# Patient Record
Sex: Male | Born: 1960 | Race: White | Hispanic: No | Smoking: Former smoker
Health system: Southern US, Community
[De-identification: ages and names within clinical notes are randomized; demographics above are authoritative.]

## PROBLEM LIST (undated history)

## (undated) DIAGNOSIS — F419 Anxiety disorder, unspecified: Secondary | ICD-10-CM

## (undated) DIAGNOSIS — F1020 Alcohol dependence, uncomplicated: Secondary | ICD-10-CM

## (undated) DIAGNOSIS — Z86010 Personal history of colonic polyps: Secondary | ICD-10-CM

## (undated) DIAGNOSIS — F909 Attention-deficit hyperactivity disorder, unspecified type: Secondary | ICD-10-CM

## (undated) DIAGNOSIS — I10 Essential (primary) hypertension: Secondary | ICD-10-CM

## (undated) DIAGNOSIS — E785 Hyperlipidemia, unspecified: Secondary | ICD-10-CM

## (undated) DIAGNOSIS — F32A Depression, unspecified: Secondary | ICD-10-CM

## (undated) DIAGNOSIS — K219 Gastro-esophageal reflux disease without esophagitis: Secondary | ICD-10-CM

## (undated) DIAGNOSIS — F1010 Alcohol abuse, uncomplicated: Secondary | ICD-10-CM

## (undated) DIAGNOSIS — F329 Major depressive disorder, single episode, unspecified: Secondary | ICD-10-CM

## (undated) DIAGNOSIS — E118 Type 2 diabetes mellitus with unspecified complications: Secondary | ICD-10-CM

## (undated) HISTORY — DX: Hyperlipidemia, unspecified: E78.5

## (undated) HISTORY — DX: Anxiety disorder, unspecified: F41.9

## (undated) HISTORY — DX: Personal history of colonic polyps: Z86.010

## (undated) HISTORY — PX: PILONIDAL CYST EXCISION: SHX744

## (undated) HISTORY — DX: Type 2 diabetes mellitus with unspecified complications: E11.8

## (undated) HISTORY — DX: Attention-deficit hyperactivity disorder, unspecified type: F90.9

## (undated) HISTORY — DX: Gastro-esophageal reflux disease without esophagitis: K21.9

## (undated) HISTORY — DX: Depression, unspecified: F32.A

---
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
## 2002-02-17 ENCOUNTER — Encounter: Payer: Self-pay | Admitting: Podiatry

## 2002-02-20 ENCOUNTER — Ambulatory Visit (HOSPITAL_COMMUNITY): Admission: RE | Admit: 2002-02-20 | Discharge: 2002-02-20 | Payer: Self-pay | Admitting: *Deleted

## 2005-01-23 ENCOUNTER — Ambulatory Visit: Payer: Self-pay | Admitting: Internal Medicine

## 2005-12-30 ENCOUNTER — Ambulatory Visit: Payer: Self-pay | Admitting: Internal Medicine

## 2006-02-03 ENCOUNTER — Ambulatory Visit: Payer: Self-pay | Admitting: Internal Medicine

## 2006-03-31 ENCOUNTER — Ambulatory Visit: Payer: Self-pay | Admitting: Internal Medicine

## 2006-06-15 ENCOUNTER — Ambulatory Visit: Payer: Self-pay | Admitting: Internal Medicine

## 2006-07-23 ENCOUNTER — Ambulatory Visit: Payer: Self-pay | Admitting: Internal Medicine

## 2006-08-24 ENCOUNTER — Ambulatory Visit: Payer: Self-pay | Admitting: Internal Medicine

## 2006-09-22 ENCOUNTER — Ambulatory Visit: Payer: Self-pay | Admitting: Internal Medicine

## 2006-11-08 ENCOUNTER — Emergency Department (HOSPITAL_COMMUNITY): Admission: EM | Admit: 2006-11-08 | Discharge: 2006-11-08 | Payer: Self-pay | Admitting: Emergency Medicine

## 2006-12-20 ENCOUNTER — Ambulatory Visit: Payer: Self-pay | Admitting: Endocrinology

## 2007-04-27 ENCOUNTER — Ambulatory Visit: Payer: Self-pay | Admitting: Internal Medicine

## 2007-04-27 LAB — CONVERTED CEMR LAB
ALT: 37 units/L (ref 0–40)
AST: 36 units/L (ref 0–37)
Albumin: 4.4 g/dL (ref 3.5–5.2)
Alkaline Phosphatase: 101 units/L (ref 39–117)
BUN: 12 mg/dL (ref 6–23)
Bilirubin, Direct: 0.1 mg/dL (ref 0.0–0.3)
CO2: 31 meq/L (ref 19–32)
Calcium: 9.4 mg/dL (ref 8.4–10.5)
Chloride: 102 meq/L (ref 96–112)
Cholesterol: 231 mg/dL (ref 0–200)
Creatinine, Ser: 0.9 mg/dL (ref 0.4–1.5)
Direct LDL: 137.1 mg/dL
GFR calc Af Amer: 118 mL/min
GFR calc non Af Amer: 97 mL/min
Glucose, Bld: 102 mg/dL — ABNORMAL HIGH (ref 70–99)
HDL: 60 mg/dL (ref >39.0)
Hgb A1c MFr Bld: 6.3 % — ABNORMAL HIGH (ref 4.6–6.0)
Potassium: 4.8 meq/L (ref 3.5–5.1)
Sodium: 141 meq/L (ref 135–145)
Total Bilirubin: 1 mg/dL (ref 0.3–1.2)
Total CHOL/HDL Ratio: 3.8
Total Protein: 7.7 g/dL (ref 6.0–8.3)
Triglycerides: 207 mg/dL (ref 0–149)
VLDL: 41 mg/dL — ABNORMAL HIGH (ref 0–40)

## 2007-06-09 ENCOUNTER — Ambulatory Visit: Payer: Self-pay | Admitting: Endocrinology

## 2007-06-09 LAB — CONVERTED CEMR LAB
Creatinine,U: 189.2 mg/dL
Hgb A1c MFr Bld: 6.5 % — ABNORMAL HIGH (ref 4.6–6.0)
Microalb Creat Ratio: 5.3 mg/g (ref 0.0–30.0)
Microalb, Ur: 1 mg/dL (ref 0.0–1.9)

## 2007-07-11 DIAGNOSIS — E7800 Pure hypercholesterolemia, unspecified: Secondary | ICD-10-CM

## 2007-07-11 DIAGNOSIS — K219 Gastro-esophageal reflux disease without esophagitis: Secondary | ICD-10-CM | POA: Insufficient documentation

## 2007-07-11 DIAGNOSIS — F419 Anxiety disorder, unspecified: Secondary | ICD-10-CM

## 2007-07-22 ENCOUNTER — Ambulatory Visit: Payer: Self-pay | Admitting: Internal Medicine

## 2007-07-22 DIAGNOSIS — T887XXA Unspecified adverse effect of drug or medicament, initial encounter: Secondary | ICD-10-CM | POA: Insufficient documentation

## 2007-07-22 DIAGNOSIS — I10 Essential (primary) hypertension: Secondary | ICD-10-CM

## 2007-07-22 LAB — CONVERTED CEMR LAB
Cholesterol, target level: 200 mg/dL
HDL goal, serum: 40 mg/dL
LDL Goal: 100 mg/dL

## 2007-07-27 LAB — CONVERTED CEMR LAB
ALT: 39 units/L (ref 0–53)
AST: 33 units/L (ref 0–37)
Albumin: 4.3 g/dL (ref 3.5–5.2)
Alkaline Phosphatase: 109 units/L (ref 39–117)
BUN: 16 mg/dL (ref 6–23)
Bilirubin, Direct: 0.1 mg/dL (ref 0.0–0.3)
CO2: 30 meq/L (ref 19–32)
Calcium: 10.3 mg/dL (ref 8.4–10.5)
Chloride: 102 meq/L (ref 96–112)
Cholesterol: 292 mg/dL (ref 0–200)
Creatinine, Ser: 0.9 mg/dL (ref 0.4–1.5)
Direct LDL: 166.7 mg/dL
GFR calc Af Amer: 118 mL/min
GFR calc non Af Amer: 97 mL/min
Glucose, Bld: 100 mg/dL — ABNORMAL HIGH (ref 70–99)
HDL: 51.4 mg/dL (ref >39.0)
Hgb A1c MFr Bld: 6.4 % — ABNORMAL HIGH (ref 4.6–6.0)
Potassium: 5.5 meq/L — ABNORMAL HIGH (ref 3.5–5.1)
Sodium: 140 meq/L (ref 135–145)
Total Bilirubin: 0.9 mg/dL (ref 0.3–1.2)
Total CHOL/HDL Ratio: 5.7
Total Protein: 7.2 g/dL (ref 6.0–8.3)
Triglycerides: 496 mg/dL (ref 0–149)
VLDL: 99 mg/dL — ABNORMAL HIGH (ref 0–40)

## 2007-09-09 ENCOUNTER — Ambulatory Visit: Payer: Self-pay | Admitting: Internal Medicine

## 2007-09-09 LAB — CONVERTED CEMR LAB
Cholesterol: 286 mg/dL (ref 0–200)
Direct LDL: 182.2 mg/dL
HDL: 46.1 mg/dL (ref >39.0)
Total CHOL/HDL Ratio: 6.2
Triglycerides: 339 mg/dL (ref 0–149)
VLDL: 68 mg/dL — ABNORMAL HIGH (ref 0–40)

## 2007-09-24 ENCOUNTER — Encounter: Payer: Self-pay | Admitting: *Deleted

## 2008-05-17 ENCOUNTER — Telehealth: Payer: Self-pay | Admitting: Internal Medicine

## 2008-06-25 ENCOUNTER — Encounter: Payer: Self-pay | Admitting: Endocrinology

## 2008-08-13 ENCOUNTER — Telehealth: Payer: Self-pay | Admitting: Endocrinology

## 2008-08-13 ENCOUNTER — Telehealth: Payer: Self-pay | Admitting: Internal Medicine

## 2008-09-07 ENCOUNTER — Ambulatory Visit: Payer: Self-pay | Admitting: Endocrinology

## 2008-09-14 LAB — CONVERTED CEMR LAB
ALT: 40 units/L (ref 0–53)
AST: 37 units/L (ref 0–37)
Albumin: 4.3 g/dL (ref 3.5–5.2)
Alkaline Phosphatase: 91 units/L (ref 39–117)
BUN: 12 mg/dL (ref 6–23)
Bilirubin, Direct: 0.1 mg/dL (ref 0.0–0.3)
CO2: 29 meq/L (ref 19–32)
Calcium: 9.3 mg/dL (ref 8.4–10.5)
Chloride: 104 meq/L (ref 96–112)
Cholesterol: 269 mg/dL (ref 0–200)
Creatinine, Ser: 0.9 mg/dL (ref 0.4–1.5)
Creatinine,U: 186.4 mg/dL
Direct LDL: 169.5 mg/dL
GFR calc Af Amer: 117 mL/min
GFR calc non Af Amer: 97 mL/min
Glucose, Bld: 100 mg/dL — ABNORMAL HIGH (ref 70–99)
HDL: 46.6 mg/dL (ref >39.0)
Hgb A1c MFr Bld: 6.8 % — ABNORMAL HIGH (ref 4.6–6.0)
Microalb Creat Ratio: 4.8 mg/g (ref 0.0–30.0)
Microalb, Ur: 0.9 mg/dL (ref 0.0–1.9)
Potassium: 4.1 meq/L (ref 3.5–5.1)
Sodium: 142 meq/L (ref 135–145)
TSH: 0.95 microintl units/mL (ref 0.35–5.50)
Total Bilirubin: 1 mg/dL (ref 0.3–1.2)
Total CHOL/HDL Ratio: 5.8
Total Protein: 7.4 g/dL (ref 6.0–8.3)
Triglycerides: 515 mg/dL (ref 0–149)
VLDL: 103 mg/dL — ABNORMAL HIGH (ref 0–40)

## 2008-09-20 ENCOUNTER — Ambulatory Visit: Payer: Self-pay | Admitting: Internal Medicine

## 2008-11-07 ENCOUNTER — Telehealth: Payer: Self-pay | Admitting: Internal Medicine

## 2008-11-08 ENCOUNTER — Telehealth: Payer: Self-pay | Admitting: Internal Medicine

## 2008-11-20 ENCOUNTER — Encounter: Payer: Self-pay | Admitting: Internal Medicine

## 2009-02-20 ENCOUNTER — Ambulatory Visit: Payer: Self-pay | Admitting: Internal Medicine

## 2009-02-21 LAB — CONVERTED CEMR LAB
ALT: 48 units/L (ref 0–53)
AST: 48 units/L — ABNORMAL HIGH (ref 0–37)
BUN: 19 mg/dL (ref 6–23)
CO2: 26 meq/L (ref 19–32)
Direct LDL: 120.2 mg/dL
GFR calc Af Amer: 134 mL/min
Glucose, Bld: 106 mg/dL — ABNORMAL HIGH (ref 70–99)
HDL: 60.4 mg/dL (ref >39.0)
Potassium: 4.8 meq/L (ref 3.5–5.1)
Sodium: 141 meq/L (ref 135–145)

## 2009-03-27 ENCOUNTER — Ambulatory Visit: Payer: Self-pay | Admitting: Endocrinology

## 2009-06-26 ENCOUNTER — Ambulatory Visit: Payer: Self-pay | Admitting: Internal Medicine

## 2009-06-26 LAB — CONVERTED CEMR LAB
ALT: 56 units/L — ABNORMAL HIGH (ref 0–53)
Albumin: 4.2 g/dL (ref 3.5–5.2)
BUN: 15 mg/dL (ref 6–23)
CO2: 29 meq/L (ref 19–32)
Calcium: 9.6 mg/dL (ref 8.4–10.5)
Chloride: 106 meq/L (ref 96–112)
Cholesterol: 199 mg/dL (ref 0–200)
Creatinine, Ser: 0.9 mg/dL (ref 0.4–1.5)
Glucose, Bld: 131 mg/dL — ABNORMAL HIGH (ref 70–99)
HDL: 67.1 mg/dL (ref >39.00)
Hgb A1c MFr Bld: 6.9 % — ABNORMAL HIGH (ref 4.6–6.5)
Total CHOL/HDL Ratio: 3
Total Protein: 7.6 g/dL (ref 6.0–8.3)
Triglycerides: 170 mg/dL — ABNORMAL HIGH (ref 0.0–149.0)

## 2009-07-03 ENCOUNTER — Ambulatory Visit: Payer: Self-pay | Admitting: Internal Medicine

## 2009-10-29 ENCOUNTER — Ambulatory Visit: Payer: Self-pay | Admitting: Internal Medicine

## 2009-10-31 LAB — CONVERTED CEMR LAB
Albumin: 4.4 g/dL (ref 3.5–5.2)
Basophils Absolute: 0.1 10*3/uL (ref 0.0–0.1)
Basophils Relative: 0.9 % (ref 0.0–3.0)
CO2: 29 meq/L (ref 19–32)
Calcium: 9.8 mg/dL (ref 8.4–10.5)
Chloride: 103 meq/L (ref 96–112)
Cholesterol: 269 mg/dL — ABNORMAL HIGH (ref 0–200)
Creatinine, Ser: 0.9 mg/dL (ref 0.4–1.5)
Eosinophils Absolute: 0.1 10*3/uL (ref 0.0–0.7)
Glucose, Bld: 117 mg/dL — ABNORMAL HIGH (ref 70–99)
Hemoglobin: 13.9 g/dL (ref 13.0–17.0)
Lymphocytes Relative: 23.8 % (ref 12.0–46.0)
MCHC: 34.3 g/dL (ref 30.0–36.0)
MCV: 91.7 fL (ref 78.0–100.0)
Monocytes Absolute: 0.4 10*3/uL (ref 0.1–1.0)
Neutro Abs: 4.2 10*3/uL (ref 1.4–7.7)
Neutrophils Relative %: 67.1 % (ref 43.0–77.0)
RBC: 4.41 M/uL (ref 4.22–5.81)
RDW: 11.5 % (ref 11.5–14.6)
Total CHOL/HDL Ratio: 5
Total Protein: 8 g/dL (ref 6.0–8.3)
Triglycerides: 360 mg/dL — ABNORMAL HIGH (ref 0.0–149.0)

## 2010-01-02 ENCOUNTER — Ambulatory Visit: Payer: Self-pay | Admitting: Internal Medicine

## 2010-01-03 LAB — CONVERTED CEMR LAB
Chloride: 98 meq/L (ref 96–112)
GFR calc non Af Amer: 95.98 mL/min (ref >60)
Potassium: 5 meq/L (ref 3.5–5.1)
Sodium: 134 meq/L — ABNORMAL LOW (ref 135–145)

## 2010-07-10 ENCOUNTER — Encounter: Payer: Self-pay | Admitting: Internal Medicine

## 2010-07-21 LAB — HM DIABETES FOOT EXAM: HM Diabetic Foot Exam: NORMAL

## 2010-08-05 ENCOUNTER — Telehealth: Payer: Self-pay | Admitting: Internal Medicine

## 2010-08-21 ENCOUNTER — Ambulatory Visit: Payer: Self-pay | Admitting: Internal Medicine

## 2010-08-26 LAB — CONVERTED CEMR LAB
ALT: 33 units/L (ref 0–53)
Alkaline Phosphatase: 91 units/L (ref 39–117)
BUN: 12 mg/dL (ref 6–23)
Bilirubin, Direct: 0.2 mg/dL (ref 0.0–0.3)
Calcium: 9.4 mg/dL (ref 8.4–10.5)
Cholesterol: 265 mg/dL — ABNORMAL HIGH (ref 0–200)
Creatinine, Ser: 0.7 mg/dL (ref 0.4–1.5)
Eosinophils Relative: 2.3 % (ref 0.0–5.0)
GFR calc non Af Amer: 123.84 mL/min (ref >60)
HDL: 56.3 mg/dL (ref >39.00)
Hgb A1c MFr Bld: 6.8 % — ABNORMAL HIGH (ref 4.6–6.5)
Lymphocytes Relative: 28.9 % (ref 12.0–46.0)
MCV: 91.9 fL (ref 78.0–100.0)
Monocytes Absolute: 0.4 10*3/uL (ref 0.1–1.0)
Monocytes Relative: 7.5 % (ref 3.0–12.0)
Neutrophils Relative %: 60.1 % (ref 43.0–77.0)
PSA: 0.5 ng/mL (ref 0.10–4.00)
Platelets: 210 10*3/uL (ref 150.0–400.0)
Total Bilirubin: 0.8 mg/dL (ref 0.3–1.2)
Total CHOL/HDL Ratio: 5
Triglycerides: 240 mg/dL — ABNORMAL HIGH (ref 0.0–149.0)
VLDL: 48 mg/dL — ABNORMAL HIGH (ref 0.0–40.0)
WBC: 4.7 10*3/uL (ref 4.5–10.5)

## 2010-09-05 ENCOUNTER — Telehealth: Payer: Self-pay | Admitting: Internal Medicine

## 2010-12-09 ENCOUNTER — Encounter: Payer: Self-pay | Admitting: Internal Medicine

## 2011-01-20 NOTE — Progress Notes (Signed)
Summary: Refill Lisinopril  Phone Note Refill Request Message from:  Pharmacy on August 05, 2010 4:14 PM  Refills Requested: Medication #1:  LISINOPRIL 40 MG TABS 1 by mouth once daily Initial call taken by: Kathrynn Speed CMA,  August 05, 2010 4:14 PM    Prescriptions: LISINOPRIL 40 MG TABS (LISINOPRIL) 1 by mouth once daily  #90 x 3   Entered by:   Kathrynn Speed CMA   Authorized by:   Birdie Sons MD   Signed by:   Kathrynn Speed CMA on 08/05/2010   Method used:   Electronically to        [facility]* (retail)       [address]       [place], [place]  [address]       Ph: [phone] or [phone]       Fax: [phone]   RxID:   [id number]

## 2011-01-20 NOTE — Progress Notes (Signed)
Summary: accu-chek  Phone Note Refill Request Message from:  Fax from Pharmacy on September 05, 2010 2:35 PM  Caller: [facility] Call For: dr swords     New/Updated Medications: ACCU-CHEK COMPACT  STRP (GLUCOSE BLOOD) test once daily ACCU-CHEK SOFTCLIX LANCETS  MISC (LANCETS) use to test once daily Prescriptions: ACCU-CHEK SOFTCLIX LANCETS  MISC (LANCETS) use to test once daily  #100 x 3   Entered by:   Kern Reap CMA (AAMA)   Authorized by:   Birdie Sons MD   Signed by:   Kern Reap CMA (AAMA) on 09/05/2010   Method used:   Electronically to        [facility]* (retail)       [address]       [place], [place]  [address]       Ph: [phone]       Fax: [phone]   RxID:   [id number] ACCU-CHEK COMPACT  STRP (GLUCOSE BLOOD) test once daily  #100 x 3   Entered by:   Kern Reap CMA (AAMA)   Authorized by:   Birdie Sons MD   Signed by:   Kern Reap CMA (AAMA) on 09/05/2010   Method used:   Electronically to        [facility]* (retail)       [address]       [place], [place]  [address]       Ph: [phone]       Fax: [phone]   RxID:   [id number]

## 2011-01-20 NOTE — Assessment & Plan Note (Signed)
Summary: DISCUSS DM//CCM   Vital Signs:  Patient profile:   50 year old male Weight:      209 pounds Temp:     98.2 degrees F Pulse rate:   84 / minute Resp:     12 per minute BP sitting:   150 / 96  (left arm)  Vitals Entered By: Gladis Riffle, RN (January 02, 2010 9:41 AM)   History of Present Illness:  Follow-Up Visit      This is a 50 year old man who presents for Follow-up visit.  The patient denies chest pain, palpitations, dizziness, syncope, edema, SOB, DOE, PND, and orthopnea.  Since the last visit the patient notes no new problems or concerns.  The patient reports not taking meds as prescribed, not monitoring BP, and dietary noncompliance.  When questioned about possible medication side effects, the patient notes none.   pt has not been taking any dm meds  Preventive Screening-Counseling & Management  Alcohol-Tobacco     Smoking Status: quit > 6 months     Year Started: 1998     Year Quit: 2000  Current Problems (verified): 1)  Essential Hypertension  (ICD-401.9) 2)  Advef, Drug/medicinal/biological Subst Nos  (ICD-995.20) 3)  Diabetes Mellitus, Type II  (ICD-250.00) 4)  Family History Diabetes 1st Degree Relative  (ICD-V18.0) 5)  Hyperlipidemia  (ICD-272.4) 6)  Gerd  (ICD-530.81) 7)  Anxiety  (ICD-300.00)  Current Medications (verified): 1)  Zoloft 100 Mg Tabs (Sertraline Hcl) .... Take 1 Tablet By Mouth Once A Day 2)  Adult Aspirin Ec Low Strength 81 Mg  Tbec (Aspirin) .... One Daily 3)  Lisinopril 40 Mg Tabs (Lisinopril) .Marland Kitchen.. 1 By Mouth Once Daily 4)  Simvastatin 80 Mg Tabs (Simvastatin) .... Take 1 Tab By Mouth At Bedtime  Allergies (verified): No Known Drug Allergies  Comments:  Nurse/Medical Assistant: discuss DM--CBGs 175 average at home, has been out of sugar medicine x 2 months  The patient's medications and allergies were reviewed with the patient and were updated in the Medication and Allergy Lists. Gladis Riffle, RN (January 02, 2010 9:42  AM)  Review of Systems       All other systems reviewed and were negative   Physical Exam  General:  Well developed, well nourished, in no acute distress.  Head:  normocephalic and atraumatic.   Eyes:  pupils equal and pupils round.   Ears:  R ear normal and L ear normal.   Neck:  No deformities, masses, or tenderness noted. Lungs:  normal respiratory effort and no intercostal retractions.   Heart:  normal rate and regular rhythm.   Abdomen:  soft and non-tender.  obese Msk:  No deformity or scoliosis noted of thoracic or lumbar spine.   Neurologic:  cranial nerves II-XII intact and gait normal.     Impression & Recommendations:  Problem # 1:  ESSENTIAL HYPERTENSION (ICD-401.9) not controlled-has not taken medications in 3 days resume medications His updated medication list for this problem includes:    Lisinopril 40 Mg Tabs (Lisinopril) .Marland Kitchen... 1 by mouth once daily  BP today: 150/96 Prior BP: 138/98 (10/29/2009)  Prior 10 Yr Risk Heart Disease: Not enough information (07/22/2007)  Labs Reviewed: K+: 5.3 (10/29/2009) Creat: : 0.9 (10/29/2009)   Chol: 269 (10/29/2009)   HDL: 59.40 (10/29/2009)   LDL: 98 (06/26/2009)   TG: 360.0 (10/29/2009)  Problem # 2:  HYPERLIPIDEMIA (ICD-272.4) continue current medications.  He will need followup laboratories. His updated medication list for this problem includes:  Simvastatin 80 Mg Tabs (Simvastatin) .Marland Kitchen... Take 1 tab by mouth at bedtime  Labs Reviewed: SGOT: 44 (10/29/2009)   SGPT: 40 (10/29/2009)  Lipid Goals: Chol Goal: 200 (07/22/2007)   HDL Goal: 40 (07/22/2007)   LDL Goal: 100 (07/22/2007)   TG Goal: 150 (07/22/2007)  Prior 10 Yr Risk Heart Disease: Not enough information (07/22/2007)   HDL:59.40 (10/29/2009), 67.10 (06/26/2009)  LDL:98 (06/26/2009), DEL (09/81/1914)  Chol:269 (10/29/2009), 199 (06/26/2009)  Trig:360.0 (10/29/2009), 170.0 (06/26/2009)  Problem # 3:  GERD (ICD-530.81) no sxs and no meds Labs  Reviewed: Hgb: 13.9 (10/29/2009)   Hct: 40.5 (10/29/2009)  Complete Medication List: 1)  Zoloft 100 Mg Tabs (Sertraline hcl) .... Take 1 tablet by mouth once a day 2)  Adult Aspirin Ec Low Strength 81 Mg Tbec (Aspirin) .... One daily 3)  Lisinopril 40 Mg Tabs (Lisinopril) .Marland Kitchen.. 1 by mouth once daily 4)  Simvastatin 80 Mg Tabs (Simvastatin) .... Take 1 tab by mouth at bedtime 5)  Metformin Hcl 500 Mg Tabs (Metformin hcl) .... Take 1 tab twice daily  Other Orders: Venipuncture (78295) TLB-BMP (Basic Metabolic Panel-BMET) (80048-METABOL)  Patient Instructions: 1)  Please schedule a follow-up appointment in 2 months. 2)  labs one week prior to visit 3)  lipids---272.4 4)  lfts-995.2 5)  bmet-995.2 6)  A1C-250.02 7)     Prescriptions: METFORMIN HCL 500 MG TABS (METFORMIN HCL) Take 1 tab twice daily  #180 x 3   Entered and Authorized by:   Birdie Sons MD   Signed by:   Birdie Sons MD on 01/02/2010   Method used:   Electronically to        [facility] (retail)       [address]       [place], [place]  [address]       Ph: [phone] or [phone]       Fax: [phone]   RxID:   [id number]

## 2011-01-20 NOTE — Letter (Signed)
Summary: Medical Clearance for Exercise program  Medical Clearance for Exercise program   Imported By: Maryln Gottron 07/11/2010 13:21:04  _____________________________________________________________________  External Attachment:    Type:   Image     Comment:   External Document

## 2011-01-20 NOTE — Assessment & Plan Note (Signed)
Summary: cpx/pt req to come in fasting/cjr   Vital Signs:  Patient profile:   50 year old male Height:      6.5 inches Weight:      199 pounds BMI:     3323.50 Pulse rate:   72 / minute Pulse rhythm:   regular Resp:     12 per minute BP sitting:   136 / 60  (left arm) Cuff size:   regular  Vitals Entered By: Gladis Riffle, RN (August 21, 2010 8:38 AM) CC: CPX, FASTING--cbgS 140-145 AT HOME Is Patient Diabetic? Yes Did you bring your meter with you today? No   CC:  CPX and FASTING--cbgS 140-145 AT HOME.  Preventive Screening-Counseling & Management  Alcohol-Tobacco     Smoking Status: quit > 6 months     Year Started: 1998     Year Quit: 2000  Current Medications (verified): 1)  Zoloft 100 Mg Tabs (Sertraline Hcl) .... Take 1 Tablet By Mouth Once A Day 2)  Adult Aspirin Ec Low Strength 81 Mg  Tbec (Aspirin) .... One Daily 3)  Lisinopril 40 Mg Tabs (Lisinopril) .Marland Kitchen.. 1 By Mouth Once Daily 4)  Simvastatin 80 Mg Tabs (Simvastatin) .... Take 1 Tab By Mouth At Bedtime 5)  Metformin Hcl 500 Mg Tabs (Metformin Hcl) .... Take 1 Tab Twice Daily  Allergies (verified): No Known Drug Allergies  Past History:  Past Medical History: Last updated: 08-21-07 Anxiety, mood disorder GERD Hyperlipidemia increased glucose ?ADD Diabetes mellitus, type II  Past Surgical History: Last updated: 2007-08-21 pilonidal cyst  Family History: Last updated: 08/21/2007 father deceased dm-71 yo mother deceased brain cancer 48 Family History Diabetes 1st degree relative-father, sister  Social History: Last updated: 10/29/2009 Occupation: [employer] Single Never Smoked Alcohol use-yes 40 oz of beer nightly Regular exercise-no  Risk Factors: Exercise: no (Aug 21, 2007)  Risk Factors: Smoking Status: quit > 6 months (08/21/2010)  Physical Exam  General:  alert and well-developed.   Head:  normocephalic and atraumatic.   Eyes:  pupils equal and pupils round.     Neck:  No deformities, masses, or tenderness noted. Lungs:  normal respiratory effort and no intercostal retractions.   Heart:  normal rate and regular rhythm.   Abdomen:  soft and non-tender.  overweight Skin:  turgor normal and color normal.   Psych:  good eye contact and not anxious appearing.    Diabetes Management Exam:    Eye Exam:       Eye Exam done elsewhere          Date: 2010/08/20          Results: normal          Done by: ophth   Impression & Recommendations:  Problem # 1:  PREVENTIVE HEALTH CARE (ICD-V70.0) congratulated on weight loss encouraged continued weight loss Orders: Venipuncture ([address]) TLB-Lipid Panel (80061-LIPID) TLB-BMP (Basic Metabolic Panel-BMET) (80048-METABOL) TLB-CBC Platelet - w/Differential (85025-CBCD) TLB-Hepatic/Liver Function Pnl (80076-HEPATIC) TLB-TSH (Thyroid Stimulating Hormone) (84443-TSH) TLB-PSA (Prostate Specific Antigen) (84153-PSA) Specimen Handling (60454)  Complete Medication List: 1)  Zoloft 100 Mg Tabs (Sertraline hcl) .... Take 1 tablet by mouth once a day 2)  Adult Aspirin Ec Low Strength 81 Mg Tbec (Aspirin) .... One daily 3)  Lisinopril 40 Mg Tabs (Lisinopril) .Marland Kitchen.. 1 by mouth once daily 4)  Simvastatin 80 Mg Tabs (Simvastatin) .... Take 1 tab by mouth at bedtime 5)  Metformin Hcl 500 Mg Tabs (Metformin hcl) .... Take 1 tab twice daily  Other Orders:  Tdap => 70yrs IM (45409) Admin 1st Vaccine (81191) TLB-A1C / Hgb A1C (Glycohemoglobin) (83036-A1C)   Immunizations Administered:  Tetanus Vaccine:    Vaccine Type: Tdap    Site: right deltoid    Mfr: GlaxoSmithKline    Dose: 0.5 ml    Route: IM    Given by: Gladis Riffle, RN    Exp. Date: 10/09/2012    Lot #: [id number]    VIS given: 11/07/08 version given August 21, 2010.

## 2011-01-22 NOTE — Letter (Signed)
Summary: Eye Exam/[facility]  Eye Exam/[facility]   Imported By: Maryln Gottron 12/17/2010 09:53:46  _____________________________________________________________________  External Attachment:    Type:   Image     Comment:   External Document

## 2011-03-20 ENCOUNTER — Encounter: Payer: Self-pay | Admitting: Internal Medicine

## 2011-03-23 ENCOUNTER — Ambulatory Visit (INDEPENDENT_AMBULATORY_CARE_PROVIDER_SITE_OTHER): Payer: PRIVATE HEALTH INSURANCE | Admitting: Internal Medicine

## 2011-03-23 ENCOUNTER — Encounter: Payer: Self-pay | Admitting: Internal Medicine

## 2011-03-23 DIAGNOSIS — E785 Hyperlipidemia, unspecified: Secondary | ICD-10-CM

## 2011-03-23 DIAGNOSIS — I10 Essential (primary) hypertension: Secondary | ICD-10-CM

## 2011-03-23 DIAGNOSIS — E119 Type 2 diabetes mellitus without complications: Secondary | ICD-10-CM

## 2011-03-23 LAB — BASIC METABOLIC PANEL
BUN: 16 mg/dL (ref 6–23)
Calcium: 9.1 mg/dL (ref 8.4–10.5)
GFR: 149.58 mL/min (ref >60.00)
Glucose, Bld: 109 mg/dL — ABNORMAL HIGH (ref 70–99)

## 2011-03-23 LAB — HEPATIC FUNCTION PANEL
ALT: 84 U/L — ABNORMAL HIGH (ref 0–53)
Alkaline Phosphatase: 96 U/L (ref 39–117)
Bilirubin, Direct: 0.1 mg/dL (ref 0.0–0.3)
Total Protein: 7 g/dL (ref 6.0–8.3)

## 2011-03-23 LAB — LIPID PANEL: Cholesterol: 196 mg/dL (ref 0–200)

## 2011-03-23 NOTE — Assessment & Plan Note (Signed)
Previously not controlled He states that he is now taking simvastatin as indicated

## 2011-03-23 NOTE — Assessment & Plan Note (Signed)
Fair control Continue current meds I'm impressed with weigh loss

## 2011-03-23 NOTE — Assessment & Plan Note (Signed)
Note purposeful weight loss He is exercising needs labs today Encouraged continue weight loss Advised low fat diet

## 2011-03-23 NOTE — Progress Notes (Signed)
  Subjective:    Patient ID: Anthony [name], male    DOB: [birth year], 50 y.o.   MRN: [id number]  HPI  patient comes in for followup of multiple medical problems including type 2 diabetes, hyperlipidemia, hypertension. The patient does not check blood sugar or blood pressure at home. The patetient does not follow an exercise or diet program. The patient denies any polyuria, polydipsia.  In the past the patient has gone to diabetic treatment center. The patient is tolerating medications  Without difficulty. The patient does admit to medication compliance.  Past Medical History  Diagnosis Date  . Anxiety   . GERD (gastroesophageal reflux disease)   . Hyperlipidemia   . Diabetes mellitus   . ADD (attention deficit disorder with hyperactivity)    Past Surgical History  Procedure Date  . Pilonidal cyst excision     reports that he has never smoked. He does not have any smokeless tobacco history on file. He reports that he drinks alcohol. He reports that he does not use illicit drugs. family history includes Brain cancer in his mother and Diabetes in his father and sister. No Known Allergies    Review of Systems  patient denies chest pain, shortness of breath, orthopnea. Denies lower extremity edema, abdominal pain, change in appetite, change in bowel movements. Patient denies rashes, musculoskeletal complaints. No other specific complaints in a complete review of systems.      Objective:   Physical Exam  well-developed well-nourished male in no acute distress. HEENT exam atraumatic, normocephalic, neck supple without jugular venous distention. Chest clear to auscultation cardiac exam S1-S2 are regular. Abdominal exam overweight with bowel sounds, soft and nontender. Extremities no edema. Neurologic exam is alert with a normal gait.        Assessment & Plan:

## 2011-05-05 NOTE — Consult Note (Signed)
[facility]                          ENDOCRINOLOGY CONSULTATION   NAME:Anthony [name], Anthony [name]                        MRN:          [id number]  DATE:06/09/2007                            DOB:          1960-12-28    REASON FOR VISIT:  Follow up diabetes.   HISTORY OF THE PRESENT ILLNESS:  A 50 year old man who takes metformin  extended release 500 mg a day as prescribed.  His glucoses are in the  low 100s.   SOCIAL HISTORY:  He is disabled, but he works in [employer] part time.   FAMILY HISTORY:  Positive for diabetes in his father.   REVIEW OF SYSTEMS:  Denies any change in his weight.   PHYSICAL EXAMINATION:  VITAL SIGNS:  Blood pressure 140/89, heart rate  is 86, temperature is 97.6, weight is 208.  GENERAL:  No distress.  EXTREMITIES:  Feet normal color and temperature.  There is no ulcer  present on the feet.  Dorsalis pedis pulses are intact bilaterally, and  sensation is intact to tough.   LABORATORY STUDIES:  On June 09, 2007, hemoglobin A1c 6.5.  Urine  microalbumin is negative.   IMPRESSION:  Well-controlled type 2 diabetes.   PLAN:  1. Same amount of metformin.  2. Return in 6 months.     Sean A. Everardo All, MD  Electronically Signed    SAE/MedQ  DD: 06/12/2007  DT: 06/13/2007  Job #: [id number]   cc:   Valetta Mole. Swords, MD

## 2011-05-08 NOTE — Op Note (Signed)
[facility]  Patient:    CROCKETT, RALLO Visit Number: [id number] MRN: [id number]          Service Type: DSU Location: DAY Attending Physician:  Comer Locket Dictated by:   Oley Balm. Pricilla Holm, D.P.M. Proc. Date: 02/20/02 Admit Date:  02/20/2002   CC:         Kari Baars, M.D.   Operative Report  PREOPERATIVE DIAGNOSIS:  Hypertrophied medial plantar aspect interphalangeal joint right hallux.  POSTOPERATIVE DIAGNOSIS:  Hypertrophied medial plantar aspect interphalangeal joint right hallux.  OPERATION:  Arthroplasty right hallux.  SURGEON:  Oley Balm. Pricilla Holm, D.P.M.  ANESTHESIA:  Local standby.  INDICATIONS:  Long-standing history of pain unrelieved by conservative care.  DESCRIPTION OF PROCEDURE:  The patient was brought into the operating room and placed on the operating table in the supine position.  The patients right foot and leg was then prepped and draped in the usual aseptic manner.  Then with an ankle tourniquet in place, padding to prevent contusion, elevated to 250 mmHg after exsanguination of the right foot, the following surgical procedures were then performed under local standby anesthesia.  ARTHROPLASTY OF THE RIGHT HALLUX:  Attention was directed to the medial aspect of the right hallux at the level of the interphalangeal joint where a linear incision was made.  The incision was widened deeply via sharp and blunt dissection being sure to identify and retract all vital structures.  A capsular incision was made in the medial plantar aspect and the hallux was then revealed.  Then utilizing a Zimmer oscillating saw, the medial plantar aspect of the hallux was resected.  All rough edges were rasped smooth.  The wound was lavaged with copious amounts of sterile saline and the capsular and subcutaneous tissues were reapproximated with 4-0 Dexon.  The skin was approximated utilizing running 4-0 subcuticular sutures of 4-0  Prolene.  All surgical sites were infiltrated with approximately 0.18 cc of dexamethasone phosphate followed by compressive bandage consisting of Betadine soaked Adaptic.  Sterile 4 x 4s and sterile Kling were applied.  The patient tolerated the procedure well and left the operating room in apparent good condition.  Vital signs stable to the recovery room. Dictated by:   Oley Balm Pricilla Holm, D.P.M. Attending Physician:  Comer Locket DD:  02/20/02 TD:  02/21/02 Job: [id number]

## 2011-05-08 NOTE — Consult Note (Signed)
[facility]                          ENDOCRINOLOGY CONSULTATION   NAME:Anthony [name]                        MRN:          [id number]  DATE:12/20/2006                            DOB:          08-Dec-1961    REFERRING PHYSICIAN:  Valetta Mole. Swords, MD   ENDOCRINOLOGY CONSULTATION REPORT:   REFERRING PHYSICIAN:  Valetta Mole. Swords, MD   REASON FOR REFERRAL:  Diabetes.   HISTORY OF PRESENT ILLNESS:  This is a 50 year old man who was noted to  have hyperglycemia in April 2007.   He also states he has gained several pounds this past year.   PAST MEDICAL HISTORY:  1. Depression/anxiety.  2. Dyslipidemia.   SOCIAL HISTORY:  He is single.  He works at [employer].   FAMILY HISTORY:  Positive for diabetes in his father and a sister.   REVIEW OF SYSTEMS:  Denies the following, chest pain, syncope, nausea,  vomiting.  He has numbness.   PHYSICAL EXAMINATION:  VITAL SIGNS: Blood pressure 140/89, heart rate  96, temperature 98.7, weight 207.  GENERAL:  No distress.  NECK:  No goiter.  CHEST: Clear to auscultation.  No respiratory distress.  CARDIOVASCULAR:  No JVD, no edema.  Regular rate and rhythm, no murmur.  EXTREMITIES: Feet have normal color and temperature .  There is no ulcer  present on the feet.  NEUROLOGIC: Alert and oriented.  Does not appear anxious nor depressed.  Sensation intact to touch on the feet.   IMPRESSION:  1. Recently diagnosed type 2 diabetes.  2. Weight gain which is usually exogenous.   PLAN:  1. We talked about the risks of diabetes and the importance of diet      and exercise therapy.  2. Check A1c and TSH.  3. Return in 3-6 months.     Sean A. Everardo All, MD  Electronically Signed    SAE/MedQ  DD: 12/22/2006  DT: 12/22/2006  Job #: [id number]   cc:   Anthony Mole. Swords, MD

## 2011-05-08 NOTE — Consult Note (Signed)
[facility]                          ENDOCRINOLOGY CONSULTATION   NAME:Anthony [name], Anthony [name]                        MRN:          [id number]  DATE:12/20/2006                            DOB:          Oct 20, 1961    REFERRING PHYSICIAN:  Valetta Mole. Swords, MD   REASON FOR REFERRAL:  Diabetes.   HISTORY OF PRESENT ILLNESS:  A 50 year old man who was noted to have  hyperglycemia in April of 2007, and he wants to know what can he do to  reduce this again.   He also states he has gained several pounds over the past year.   PAST MEDICAL HISTORY:  1. Dyslipidemia.  2. Anxiety/depression.   MEDICATIONS:  1. Advicor.  2. BuSpar.  3. Aspirin.  4. Zoloft.   SOCIAL HISTORY:  He is single.  He works at [employer].   FAMILY HISTORY:  Positive for diabetes in his father and sister.   Dictation ended at this point.     Sean A. Everardo All, MD  Electronically Signed    SAE/MedQ  DD: 12/22/2006  DT: 12/22/2006  Job #: [id number]

## 2011-05-08 NOTE — H&P (Signed)
[facility]  Patient:    Anthony [name], Anthony [name] Visit Number: [id number] MRN: [id number]          Service Type: DSU Location: DAY Attending Physician:  Comer Locket Dictated by:   Oley Balm. Pricilla Holm, D.P.M. Admit Date:  02/20/2002 Discharge Date: 02/20/2002                           History and Physical  HISTORY OF PRESENT ILLNESS:  The patient is a 50 year old white male who presents to the office with a chief complaint of pain in his left and right great toes.  The patient relates he has a longstanding history of pain, mainly from his right great toe.  He relates that it "feels like a toothache."  He has tried to use a pumas stone over the affected area.  The patient relates the area has been getting larger and he has difficulty wearing enclosed shoes. He works at ______ three to four hours a day three days a week.  PAST MEDICAL HISTORY:  Cyst removed.  No transfusions or hepatitis.  MEDICATIONS:  Prevacid, ______, vitamin C.  FAMILY HISTORY:  Cancer and heart disease.  SOCIAL HISTORY:  Negative.  REVIEW OF SYSTEMS:  History of borderline diabetes controlled by diet, arthritis, stomach ulcers, mild hypertension.  PHYSICAL EXAMINATION:  EXTREMITIES:  Lower extremity exam reveals palpable pedal pulses, both DP and PT, with spontaneous capillary filling time.  NEUROLOGIC:  Exam essentially within normal limits.  MUSCULOSKELETAL:  Exam revealed hypertrophy of the medial plantar aspects of both great toes at the level of the interphalangeal joints, with the right being worse than the left.  There is limitation of motion of the right interphalangeal joint.  X-RAY FINDINGS:  X-rays reveal patient has hypertrophy of the medial plantar aspect of the interphalangeal joints of both big toes, with the right being worse than the left.  There may have been a previous fracture on the right. There is a noted hyperostosis with spurring of the  joint.  ASSESSMENT:  Hyperostoses with spurring of the joints, both big toes.  PLAN:  I have reviewed both conservative and surgical management.  We decided to go ahead and do a surgical correction of same.  I reviewed with him arthroplasty of hallux with bunionectomy and reviewed with him procedures and complications of procedures such as infection, bone infection, postoperative pain, swelling, etc.  Patient seems to understand the same and again, surgery has been scheduled for February 20, 2002. Dictated by:   Oley Balm Pricilla Holm, D.P.M. Attending Physician:  Comer Locket DD:  02/19/02 TD:  02/20/02 Job: [id number]

## 2011-07-21 ENCOUNTER — Ambulatory Visit: Payer: Medicare Other | Admitting: Internal Medicine

## 2011-08-25 ENCOUNTER — Ambulatory Visit (INDEPENDENT_AMBULATORY_CARE_PROVIDER_SITE_OTHER): Payer: Medicare Other | Admitting: Internal Medicine

## 2011-08-25 ENCOUNTER — Encounter: Payer: Self-pay | Admitting: Internal Medicine

## 2011-08-25 DIAGNOSIS — I10 Essential (primary) hypertension: Secondary | ICD-10-CM

## 2011-08-25 DIAGNOSIS — E119 Type 2 diabetes mellitus without complications: Secondary | ICD-10-CM

## 2011-08-25 LAB — TSH: TSH: 0.74 u[IU]/mL (ref 0.35–5.50)

## 2011-08-25 LAB — BASIC METABOLIC PANEL
Chloride: 104 mEq/L (ref 96–112)
Creatinine, Ser: 0.8 mg/dL (ref 0.4–1.5)
Potassium: 4.5 mEq/L (ref 3.5–5.1)

## 2011-08-25 LAB — CBC WITH DIFFERENTIAL/PLATELET
Basophils Absolute: 0.1 10*3/uL (ref 0.0–0.1)
Basophils Relative: 1 % (ref 0.0–3.0)
HCT: 39.4 % (ref 39.0–52.0)
Hemoglobin: 13.3 g/dL (ref 13.0–17.0)
Lymphs Abs: 1.3 10*3/uL (ref 0.7–4.0)
Monocytes Relative: 6.9 % (ref 3.0–12.0)
Neutro Abs: 4.5 10*3/uL (ref 1.4–7.7)
RBC: 4.32 Mil/uL (ref 4.22–5.81)
RDW: 13 % (ref 11.5–14.6)

## 2011-08-25 LAB — HEPATIC FUNCTION PANEL
ALT: 36 U/L (ref 0–53)
AST: 35 U/L (ref 0–37)
Bilirubin, Direct: 0 mg/dL (ref 0.0–0.3)
Total Bilirubin: 0.6 mg/dL (ref 0.3–1.2)

## 2011-08-25 LAB — HM DIABETES FOOT EXAM

## 2011-08-25 MED ORDER — LABETALOL HCL 200 MG PO TABS: 200.0000 mg | ORAL_TABLET | Freq: Two times a day (BID) | ORAL | Status: DC

## 2011-08-25 NOTE — Assessment & Plan Note (Signed)
Not controlled Add labetolol Side effects discussed with patient and family friend

## 2011-08-25 NOTE — Progress Notes (Signed)
  Subjective:    Patient ID: Anthony [name], male    DOB: 08-16-1961, 50 y.o.   MRN: [id number]  HPI   patient comes in for followup of multiple medical problems including type 2 diabetes, hyperlipidemia, hypertension. The patient does not check blood sugar or blood pressure at home. The patetient does not follow an exercise or diet program. The patient denies any polyuria, polydipsia.  In the past the patient has gone to diabetic treatment center. The patient is tolerating medications  Without difficulty. The patient does admit to medication compliance.  Past Medical History  Diagnosis Date  . Anxiety   . GERD (gastroesophageal reflux disease)   . Hyperlipidemia   . Diabetes mellitus   . ADD (attention deficit disorder with hyperactivity)    Past Surgical History  Procedure Date  . Pilonidal cyst excision     reports that he quit smoking about 15 years ago. He does not have any smokeless tobacco history on file. He reports that he drinks alcohol. He reports that he does not use illicit drugs. family history includes Brain cancer in his mother and Diabetes in his father and sister. No Known Allergies   Review of Systems  patient denies chest pain, shortness of breath, orthopnea. Denies lower extremity edema, abdominal pain, change in appetite, change in bowel movements. Patient denies rashes, musculoskeletal complaints. No other specific complaints in a complete review of systems.      Objective:   Physical Exam  Well-developed well-nourished male in no acute distress. HEENT exam atraumatic, normocephalic, extraocular muscles are intact. Neck is supple. No jugular venous distention no thyromegaly. Chest clear to auscultation without increased work of breathing. Cardiac exam S1 and S2 are regular. Abdominal exam active bowel sounds, soft, nontender. Extremities no edema. Neurologic exam she is alert without any motor sensory deficits. Gait is normal.        Assessment & Plan:

## 2011-08-25 NOTE — Assessment & Plan Note (Signed)
Check labs today.

## 2011-08-27 ENCOUNTER — Telehealth: Payer: Self-pay | Admitting: Internal Medicine

## 2011-08-27 NOTE — Telephone Encounter (Signed)
LMTCB

## 2011-08-27 NOTE — Telephone Encounter (Signed)
Pt had questions regarding blood pressure meds. Please contact

## 2011-08-28 ENCOUNTER — Telehealth: Payer: Self-pay

## 2011-08-28 NOTE — Telephone Encounter (Signed)
Message copied by Beverely Low on Fri Aug 28, 2011  3:58 PM ------      Message from: Lindley Magnus      Created: Tue Aug 25, 2011  9:44 PM       Call patient labs are ok

## 2011-08-28 NOTE — Telephone Encounter (Signed)
Attempted to call. No answer and no option to leave message

## 2011-09-01 ENCOUNTER — Telehealth: Payer: Self-pay | Admitting: *Deleted

## 2011-09-01 NOTE — Telephone Encounter (Signed)
pts mother-in-law wants to know how many BP meds pt is suppose to be taking.  There is 2 on med list but she wanted to double check with Dr Cato Mulligan

## 2011-09-01 NOTE — Telephone Encounter (Signed)
Pt aware.

## 2011-09-01 NOTE — Telephone Encounter (Signed)
Ok to review med list with patient and family

## 2011-09-08 ENCOUNTER — Other Ambulatory Visit: Payer: Self-pay | Admitting: Internal Medicine

## 2011-09-28 ENCOUNTER — Encounter: Payer: Self-pay | Admitting: Internal Medicine

## 2011-09-28 ENCOUNTER — Ambulatory Visit (INDEPENDENT_AMBULATORY_CARE_PROVIDER_SITE_OTHER): Payer: Medicare Other | Admitting: Internal Medicine

## 2011-09-28 DIAGNOSIS — I10 Essential (primary) hypertension: Secondary | ICD-10-CM

## 2011-09-28 DIAGNOSIS — E119 Type 2 diabetes mellitus without complications: Secondary | ICD-10-CM

## 2011-09-28 DIAGNOSIS — E785 Hyperlipidemia, unspecified: Secondary | ICD-10-CM

## 2011-09-28 DIAGNOSIS — Z23 Encounter for immunization: Secondary | ICD-10-CM

## 2011-09-28 NOTE — Assessment & Plan Note (Signed)
a1c fair control Currently no meds---advised aggressive weight loss  see me 6 months

## 2011-09-28 NOTE — Progress Notes (Signed)
  Subjective:    Patient ID: Anthony [name], male    DOB: 08/15/1961, 50 y.o.   MRN: [id number]  HPI   patient comes in for followup of multiple medical problems including type 2 diabetes, hyperlipidemia, hypertension. The patient does not check blood sugar or blood pressure at home. The patetient does not follow an exercise or diet program. The patient denies any polyuria, polydipsia.  In the past the patient has gone to diabetic treatment center. The patient is tolerating medications  Without difficulty. The patient does admit to medication compliance.   Past Medical History  Diagnosis Date  . Anxiety   . GERD (gastroesophageal reflux disease)   . Hyperlipidemia   . Diabetes mellitus   . ADD (attention deficit disorder with hyperactivity)    Past Surgical History  Procedure Date  . Pilonidal cyst excision     reports that he quit smoking about 15 years ago. He does not have any smokeless tobacco history on file. He reports that he drinks alcohol. He reports that he does not use illicit drugs. family history includes Brain cancer in his mother and Diabetes in his father and sister. No Known Allergies   Review of Systems  patient denies chest pain, shortness of breath, orthopnea. Denies lower extremity edema, abdominal pain, change in appetite, change in bowel movements. Patient denies rashes, musculoskeletal complaints. No other specific complaints in a complete review of systems.      Objective:   Physical Exam  well-developed well-nourished male in no acute distress. HEENT exam atraumatic, normocephalic, neck supple without jugular venous distention. Chest clear to auscultation cardiac exam S1-S2 are regular. Abdominal exam overweight with bowel sounds, soft and nontender. Extremities no edema. Neurologic exam is alert with a normal gait.        Assessment & Plan:

## 2011-09-28 NOTE — Assessment & Plan Note (Signed)
Fair control Check labs in 6 months

## 2011-09-28 NOTE — Assessment & Plan Note (Signed)
BP Readings from Last 3 Encounters:  09/28/11 142/92  08/25/11 162/108  03/23/11 116/80   Fair control I've advised aggressive weight loss

## 2011-12-26 ENCOUNTER — Other Ambulatory Visit: Payer: Self-pay | Admitting: Internal Medicine

## 2012-01-25 ENCOUNTER — Other Ambulatory Visit: Payer: Self-pay | Admitting: Internal Medicine

## 2012-02-23 ENCOUNTER — Other Ambulatory Visit: Payer: Self-pay | Admitting: Internal Medicine

## 2012-03-30 ENCOUNTER — Other Ambulatory Visit (INDEPENDENT_AMBULATORY_CARE_PROVIDER_SITE_OTHER): Payer: Medicare Other

## 2012-03-30 DIAGNOSIS — E119 Type 2 diabetes mellitus without complications: Secondary | ICD-10-CM

## 2012-03-30 DIAGNOSIS — E785 Hyperlipidemia, unspecified: Secondary | ICD-10-CM

## 2012-03-30 LAB — BASIC METABOLIC PANEL
BUN: 16 mg/dL (ref 6–23)
CO2: 25 mEq/L (ref 19–32)
Chloride: 102 mEq/L (ref 96–112)
GFR: 93.88 mL/min (ref >60.00)
Glucose, Bld: 155 mg/dL — ABNORMAL HIGH (ref 70–99)
Potassium: 5.4 mEq/L — ABNORMAL HIGH (ref 3.5–5.1)

## 2012-03-30 LAB — LIPID PANEL
HDL: 71.1 mg/dL (ref >39.00)
Total CHOL/HDL Ratio: 4
VLDL: 47 mg/dL — ABNORMAL HIGH (ref 0.0–40.0)

## 2012-03-30 LAB — HEPATIC FUNCTION PANEL
Bilirubin, Direct: 0 mg/dL (ref 0.0–0.3)
Total Bilirubin: 0.3 mg/dL (ref 0.3–1.2)

## 2012-04-06 ENCOUNTER — Ambulatory Visit (INDEPENDENT_AMBULATORY_CARE_PROVIDER_SITE_OTHER): Payer: Medicare Other | Admitting: Internal Medicine

## 2012-04-06 VITALS — BP 130/90 | HR 88 | Temp 98.3°F | Resp 16 | Ht 68.0 in | Wt 202.0 lb

## 2012-04-06 DIAGNOSIS — E119 Type 2 diabetes mellitus without complications: Secondary | ICD-10-CM

## 2012-04-06 DIAGNOSIS — I10 Essential (primary) hypertension: Secondary | ICD-10-CM

## 2012-04-06 DIAGNOSIS — E785 Hyperlipidemia, unspecified: Secondary | ICD-10-CM

## 2012-04-06 MED ORDER — ATORVASTATIN CALCIUM 40 MG PO TABS: 40.0000 mg | ORAL_TABLET | Freq: Every day | ORAL | Status: DC

## 2012-04-06 NOTE — Assessment & Plan Note (Signed)
a1c ok Discussed need for compliance

## 2012-04-06 NOTE — Assessment & Plan Note (Signed)
bp is modestly elevated He is not taking meds as prescribed

## 2012-04-06 NOTE — Patient Instructions (Signed)
You need to stop all alcohol Call alcoholics anonymous  Finding Treatment for Alcohol and Drug Addiction It can be hard to find the right place to get professional treatment. Here are some important things to consider:  There are different types of treatment to choose from.   Some programs are live-in (residential) while others are not (outpatient). Sometimes a combination is offered.   No single type of program is right for everyone.   Most treatment programs involve a combination of education, counseling, and a 12-step, spiritually-based approach.   There are non-spiritually based programs (not 12-step).   Some treatment programs are government sponsored. They are geared for patients without private insurance.   Treatment programs can vary in many respects such as:   Cost and types of insurance accepted.   Types of on-site medical services offered.   Length of stay, setting, and size.   Overall philosophy of treatment.  A person may need specialized treatment or have needs not addressed by all programs. For example, adolescents need treatment appropriate for their age. Other people have secondary disorders that must be managed as well. Secondary conditions can include mental illness, such as depression or diabetes. Often, a period of detoxification from alcohol or drugs is needed. This requires medical supervision and not all programs offer this. THINGS TO CONSIDER WHEN SELECTING A TREATMENT PROGRAM   Is the program certified by the appropriate government agency? Even private programs must be certified and employ certified professionals.   Does the program accept your insurance? If not, can a payment plan be set up?   Is the facility clean, organized, and well run? Do they allow you to speak with graduates who can share their treatment experience with you? Can you tour the facility? Can you meet with staff?   Does the program meet the full range of individual needs?   Does  the treatment program address sexual orientation and physical disabilities? Do they provide age, gender, and culturally appropriate treatment services?   Is treatment available in languages other than English?   Is long-term aftercare support or guidance encouraged and provided?   Is assessment of an individual's treatment plan ongoing to ensure it meets changing needs?   Does the program use strategies to encourage reluctant patients to remain in treatment long enough to increase the likelihood of success?   Does the program offer counseling (individual or group) and other behavioral therapies?   Does the program offer medicine as part of the treatment regimen, if needed?   Is there ongoing monitoring of possible relapse? Is there a defined relapse prevention program? Are services or referrals offered to family members to ensure they understand addiction and the recovery process? This would help them support the recovering individual.   Are 12-step meetings held at the center or is transport available for patients to attend outside meetings?  In countries outside of the [place]. and [place], Magazine features editor for contact information for services in your area. Document Released: 11/05/2005 Document Revised: 11/26/2011 Document Reviewed: 05/17/2008 [facility] Patient Information 2012 [place], [place].

## 2012-04-06 NOTE — Assessment & Plan Note (Signed)
He needs to resume meds

## 2012-04-10 NOTE — Progress Notes (Signed)
Patient ID: Anthony [name], male   DOB: 1961-12-06, 51 y.o.   MRN: [id number]  patient comes in for followup of multiple medical problems including type 2 diabetes, hyperlipidemia, hypertension. The patient does not check blood sugar or blood pressure at home. The patetient does not follow an exercise or diet program. The patient denies any polyuria, polydipsia.  In the past the patient has gone to diabetic treatment center. The patient is tolerating medications  Without difficulty. The patient does admit to medication compliance.   Past Medical History  Diagnosis Date  . Anxiety   . GERD (gastroesophageal reflux disease)   . Hyperlipidemia   . Diabetes mellitus   . ADD (attention deficit disorder with hyperactivity)     History   Social History  . Marital Status: Single    Spouse Name: N/A    Number of Children: N/A  . Years of Education: N/A   Occupational History  . Not on file.   Social History Main Topics  . Smoking status: Former Smoker    Quit date: 08/24/1996  . Smokeless tobacco: Not on file  . Alcohol Use: Yes  . Drug Use: No  . Sexually Active:    Other Topics Concern  . Not on file   Social History Narrative  . No narrative on file    Past Surgical History  Procedure Date  . Pilonidal cyst excision     Family History  Problem Relation Age of Onset  . Brain cancer Mother   . Diabetes Father   . Diabetes Sister     No Known Allergies  Current Outpatient Prescriptions on File Prior to Visit  Medication Sig Dispense Refill  . ACCU-CHEK SOFTCLIX LANCETS lancets 1 each by Other route daily. Use as instructed       . aspirin 81 MG tablet Take 81 mg by mouth daily.        Marland Kitchen glucose blood (ACCU-CHEK COMPACT STRIPS) test strip 1 each by Other route daily. Use as instructed       . labetalol (NORMODYNE) 200 MG tablet Take 1 tablet (200 mg total) by mouth 2 (two) times daily.  180 tablet  3  . lisinopril (PRINIVIL,ZESTRIL) 40 MG tablet TAKE ONE (1) TABLET BY  MOUTH EVERY      DAY  90 tablet  1  . metFORMIN (GLUCOPHAGE-XR) 500 MG 24 hr tablet TAKE ONE TABLET TWICE DAILY  180 tablet  1  . sertraline (ZOLOFT) 100 MG tablet TAKE ONE (1) TABLET BY MOUTH EVERY      DAY  30 tablet  5  . atorvastatin (LIPITOR) 40 MG tablet Take 1 tablet (40 mg total) by mouth daily.  90 tablet  3     patient denies chest pain, shortness of breath, orthopnea. Denies lower extremity edema, abdominal pain, change in appetite, change in bowel movements. Patient denies rashes, musculoskeletal complaints. No other specific complaints in a complete review of systems.   BP 130/90  Pulse 88  Temp 98.3 F (36.8 C)  Resp 16  Ht 5\' 8"  (1.727 m)  Wt 202 lb (91.627 kg)  BMI 30.71 kg/m2 Well-developed male in no acute distress. HEENT exam atraumatic, normocephalic, extraocular muscles are intact. Conjunctivae are pink without exudate. Neck is supple without lymphadenopathy, thyromegaly, jugular venous distention. Chest is clear to auscultation without increased work of breathing. Cardiac exam S1-S2 are regular. The PMI is normal. No significant murmurs or gallops. Abdominal exam active bowel sounds, soft, nontender.

## 2012-07-25 ENCOUNTER — Other Ambulatory Visit: Payer: Self-pay | Admitting: Internal Medicine

## 2012-09-22 ENCOUNTER — Other Ambulatory Visit: Payer: Self-pay | Admitting: Internal Medicine

## 2012-10-04 ENCOUNTER — Telehealth (HOSPITAL_COMMUNITY): Payer: Self-pay | Admitting: Dietician

## 2012-10-04 ENCOUNTER — Ambulatory Visit (INDEPENDENT_AMBULATORY_CARE_PROVIDER_SITE_OTHER): Payer: Medicare Other | Admitting: Internal Medicine

## 2012-10-04 ENCOUNTER — Encounter: Payer: Self-pay | Admitting: Internal Medicine

## 2012-10-04 VITALS — BP 154/102 | HR 84 | Temp 98.7°F | Wt 193.0 lb

## 2012-10-04 DIAGNOSIS — I10 Essential (primary) hypertension: Secondary | ICD-10-CM

## 2012-10-04 DIAGNOSIS — E119 Type 2 diabetes mellitus without complications: Secondary | ICD-10-CM

## 2012-10-04 DIAGNOSIS — E785 Hyperlipidemia, unspecified: Secondary | ICD-10-CM

## 2012-10-04 DIAGNOSIS — Z23 Encounter for immunization: Secondary | ICD-10-CM

## 2012-10-04 LAB — BASIC METABOLIC PANEL
BUN: 13 mg/dL (ref 6–23)
Calcium: 9.4 mg/dL (ref 8.4–10.5)
Creatinine, Ser: 0.9 mg/dL (ref 0.4–1.5)
GFR: 98.68 mL/min (ref >60.00)
Glucose, Bld: 274 mg/dL — ABNORMAL HIGH (ref 70–99)

## 2012-10-04 LAB — HEPATIC FUNCTION PANEL
ALT: 32 U/L (ref 0–53)
AST: 29 U/L (ref 0–37)
Bilirubin, Direct: 0.1 mg/dL (ref 0.0–0.3)
Total Bilirubin: 0.8 mg/dL (ref 0.3–1.2)

## 2012-10-04 NOTE — Telephone Encounter (Signed)
Unable to reach by phone. Message reports voicemail is not activated. Sent letter to pt home via [place] Mail in attempt to contact pt to schedule appointment.

## 2012-10-04 NOTE — Telephone Encounter (Signed)
Received referral via phone from [place] at [facility].

## 2012-10-05 ENCOUNTER — Ambulatory Visit: Payer: Medicare Other | Admitting: Internal Medicine

## 2012-10-05 NOTE — Progress Notes (Signed)
Patient ID: Anthony [name], male   DOB: 10-Mar-1961, 51 y.o.   MRN: [id number]  patient comes in for followup of multiple medical problems including type 2 diabetes, hyperlipidemia, hypertension. The patient does not check blood sugar or blood pressure at home. The patetient does not follow an exercise or diet program. The patient denies any polyuria, polydipsia.  In the past the patient has gone to diabetic treatment center. The patient is tolerating medications  Without difficulty. The patient does admit to medication compliance.  Past Medical History  Diagnosis Date  . Anxiety   . GERD (gastroesophageal reflux disease)   . Hyperlipidemia   . Diabetes mellitus   . ADD (attention deficit disorder with hyperactivity)     History   Social History  . Marital Status: Single    Spouse Name: N/A    Number of Children: N/A  . Years of Education: N/A   Occupational History  . Not on file.   Social History Main Topics  . Smoking status: Former Smoker    Quit date: 08/24/1996  . Smokeless tobacco: Not on file  . Alcohol Use: Yes  . Drug Use: No  . Sexually Active:    Other Topics Concern  . Not on file   Social History Narrative  . No narrative on file    Past Surgical History  Procedure Date  . Pilonidal cyst excision     Family History  Problem Relation Age of Onset  . Brain cancer Mother   . Diabetes Father   . Diabetes Sister     No Known Allergies  Current Outpatient Prescriptions on File Prior to Visit  Medication Sig Dispense Refill  . ACCU-CHEK SOFTCLIX LANCETS lancets 1 each by Other route daily. Use as instructed       . aspirin 81 MG tablet Take 81 mg by mouth daily.        Marland Kitchen atorvastatin (LIPITOR) 40 MG tablet Take 1 tablet (40 mg total) by mouth daily.  90 tablet  3  . glucose blood (ACCU-CHEK COMPACT STRIPS) test strip 1 each by Other route daily. Use as instructed       . labetalol (NORMODYNE) 200 MG tablet Take 200 mg by mouth 2 (two) times daily.      Marland Kitchen  lisinopril (PRINIVIL,ZESTRIL) 40 MG tablet TAKE ONE (1) TABLET BY MOUTH EVERY      DAY  90 tablet  0  . metFORMIN (GLUCOPHAGE-XR) 500 MG 24 hr tablet TAKE ONE TABLET TWICE DAILY  180 tablet  1  . sertraline (ZOLOFT) 100 MG tablet TAKE ONE (1) TABLET BY MOUTH EVERY      DAY  30 tablet  5     patient denies chest pain, shortness of breath, orthopnea. Denies lower extremity edema, abdominal pain, change in appetite, change in bowel movements. Patient denies rashes, musculoskeletal complaints. No other specific complaints in a complete review of systems.   BP 154/102  Pulse 84  Temp 98.7 F (37.1 C) (Oral)  Wt 193 lb (87.544 kg)  well-developed well-nourished male in no acute distress. HEENT exam atraumatic, normocephalic, neck supple without jugular venous distention. Chest clear to auscultation cardiac exam S1-S2 are regular. Abdominal exam overweight with bowel sounds, soft and nontender. Extremities no edema. Neurologic exam is alert with a normal gait.

## 2012-10-05 NOTE — Assessment & Plan Note (Signed)
Suspect poor control due to dietary noncompliance Discussed need to avoid all alcohol ( currently two drinks per day) Avoid all sweet drinks unless diet

## 2012-10-05 NOTE — Assessment & Plan Note (Signed)
It's pretty clear he isn't taking meds Quite likely he is not taking DM meds either-  Will discuss with care-giver/pt/friends

## 2012-10-12 NOTE — Telephone Encounter (Signed)
Sent letter to pt home via [place] Mail in attempt to contact pt to schedule appointment.  

## 2012-10-18 NOTE — Telephone Encounter (Signed)
Sent letter to pt home via [place] Mail in attempt to contact pt to schedule appointment.  

## 2012-10-24 NOTE — Telephone Encounter (Signed)
Pt has not responded to attempts to contact to schedule appointment. Referral filed.  

## 2012-11-02 ENCOUNTER — Other Ambulatory Visit: Payer: Self-pay | Admitting: Internal Medicine

## 2012-12-26 ENCOUNTER — Other Ambulatory Visit: Payer: Self-pay | Admitting: Internal Medicine

## 2013-03-28 ENCOUNTER — Other Ambulatory Visit: Payer: Self-pay | Admitting: Internal Medicine

## 2013-04-05 ENCOUNTER — Ambulatory Visit: Payer: PRIVATE HEALTH INSURANCE | Admitting: Internal Medicine

## 2013-05-02 ENCOUNTER — Telehealth: Payer: Self-pay | Admitting: Internal Medicine

## 2013-05-02 NOTE — Telephone Encounter (Signed)
ok 

## 2013-05-02 NOTE — Telephone Encounter (Addendum)
Dr Amador Cunas,  pt would like to switch to you from Dr Cato Mulligan. Pt has [employer]. Pt request you.  Dr Cato Mulligan, pt would like to switch to Dr Amador Cunas, it that ok with you?

## 2013-05-02 NOTE — Telephone Encounter (Signed)
Per Dr Cato Mulligan he would prefer pt stay with him.

## 2013-05-05 ENCOUNTER — Ambulatory Visit: Payer: PRIVATE HEALTH INSURANCE | Admitting: Internal Medicine

## 2013-05-17 ENCOUNTER — Telehealth: Payer: Self-pay | Admitting: Internal Medicine

## 2013-05-17 NOTE — Telephone Encounter (Signed)
Pt mother in laws is requesting a cpx from dr K due to Dr Timoteo Gaul first avail cpx in aug 2014.

## 2013-05-18 NOTE — Telephone Encounter (Signed)
Regular f/u for dm with any provider will be adequate

## 2013-05-18 NOTE — Telephone Encounter (Signed)
Ok but there are other providers available

## 2013-05-18 NOTE — Telephone Encounter (Signed)
Pt is sch with dr Kirtland Bouchard

## 2013-05-19 ENCOUNTER — Encounter: Payer: Self-pay | Admitting: Internal Medicine

## 2013-05-19 ENCOUNTER — Ambulatory Visit (INDEPENDENT_AMBULATORY_CARE_PROVIDER_SITE_OTHER): Payer: PRIVATE HEALTH INSURANCE | Admitting: Internal Medicine

## 2013-05-19 VITALS — BP 170/90 | HR 88 | Temp 98.4°F | Resp 20 | Wt 206.0 lb

## 2013-05-19 DIAGNOSIS — E785 Hyperlipidemia, unspecified: Secondary | ICD-10-CM

## 2013-05-19 DIAGNOSIS — M7062 Trochanteric bursitis, left hip: Secondary | ICD-10-CM

## 2013-05-19 DIAGNOSIS — M76899 Other specified enthesopathies of unspecified lower limb, excluding foot: Secondary | ICD-10-CM

## 2013-05-19 DIAGNOSIS — E119 Type 2 diabetes mellitus without complications: Secondary | ICD-10-CM

## 2013-05-19 DIAGNOSIS — I10 Essential (primary) hypertension: Secondary | ICD-10-CM

## 2013-05-19 MED ORDER — METFORMIN HCL 1000 MG PO TABS: 1000.0000 mg | ORAL_TABLET | Freq: Two times a day (BID) | ORAL | Status: DC

## 2013-05-19 MED ORDER — ATORVASTATIN CALCIUM 40 MG PO TABS: 40.0000 mg | ORAL_TABLET | Freq: Every day | ORAL | Status: DC

## 2013-05-19 MED ORDER — GLIPIZIDE ER 5 MG PO TB24: 5.0000 mg | ORAL_TABLET | Freq: Every day | ORAL | Status: DC

## 2013-05-19 NOTE — Patient Instructions (Addendum)

## 2013-05-19 NOTE — Progress Notes (Signed)
Subjective:    Patient ID: Anthony [name], male    DOB: [birth year], 52 y.o.   MRN: [id number]  HPI  52 year old patient who is seen today with a chief complaint of left lateral hip pain. His work requires considerable heavy lifting and he feels that he strained his surgery about one month ago. Pain is intermittent. History of hypertension and type 2 diabetes.  Past Medical History  Diagnosis Date  . Anxiety   . GERD (gastroesophageal reflux disease)   . Hyperlipidemia   . Diabetes mellitus   . ADD (attention deficit disorder with hyperactivity)     History   Social History  . Marital Status: Single    Spouse Name: N/A    Number of Children: N/A  . Years of Education: N/A   Occupational History  . Not on file.   Social History Main Topics  . Smoking status: Former Smoker    Quit date: 08/24/1996  . Smokeless tobacco: Not on file  . Alcohol Use: Yes  . Drug Use: No  . Sexually Active:    Other Topics Concern  . Not on file   Social History Narrative  . No narrative on file    Past Surgical History  Procedure Laterality Date  . Pilonidal cyst excision      Family History  Problem Relation Age of Onset  . Brain cancer Mother   . Diabetes Father   . Diabetes Sister     No Known Allergies  Current Outpatient Prescriptions on File Prior to Visit  Medication Sig Dispense Refill  . ACCU-CHEK SOFTCLIX LANCETS lancets 1 each by Other route daily. Use as instructed       . aspirin 81 MG tablet Take 81 mg by mouth daily.        Marland Kitchen glucose blood (ACCU-CHEK COMPACT STRIPS) test strip 1 each by Other route daily. Use as instructed       . labetalol (NORMODYNE) 200 MG tablet TAKE ONE TABLET BY MOUTH TWICE A DAY  180 tablet  1  . lisinopril (PRINIVIL,ZESTRIL) 40 MG tablet TAKE ONE (1) TABLET BY MOUTH EVERY      DAY  90 tablet  1  . sertraline (ZOLOFT) 100 MG tablet TAKE ONE (1) TABLET BY MOUTH EVERY      DAY  30 tablet  6   No current facility-administered medications on  file prior to visit.    BP 170/90  Pulse 88  Temp(Src) 98.4 F (36.9 C) (Oral)  Resp 20  Wt 206 lb (93.441 kg)  BMI 31.33 kg/m2  SpO2 98%       Review of Systems  Constitutional: Negative for fever, chills, appetite change and fatigue.  HENT: Negative for hearing loss, ear pain, congestion, sore throat, trouble swallowing, neck stiffness, dental problem, voice change and tinnitus.   Eyes: Negative for pain, discharge and visual disturbance.  Respiratory: Negative for cough, chest tightness, wheezing and stridor.   Cardiovascular: Negative for chest pain, palpitations and leg swelling.  Gastrointestinal: Negative for nausea, vomiting, abdominal pain, diarrhea, constipation, blood in stool and abdominal distention.  Genitourinary: Negative for urgency, hematuria, flank pain, discharge, difficulty urinating and genital sores.  Musculoskeletal: Negative for myalgias, back pain, joint swelling, arthralgias and gait problem.       Left lateral upper hip pain  Skin: Negative for rash.  Neurological: Negative for dizziness, syncope, speech difficulty, weakness, numbness and headaches.  Hematological: Negative for adenopathy. Does not bruise/bleed easily.  Psychiatric/Behavioral: Negative for behavioral problems and  dysphoric mood. The patient is not nervous/anxious.        Objective:   Physical Exam  Constitutional: He appears well-developed and well-nourished. No distress.  Blood pressure 160/90  Musculoskeletal:  Tenderness over the left greater trochanter          Assessment & Plan:   Left greater trochanteric bursitis. After informed consent local preparation with alcohol an injection of 2 cc lidocaine and 80 mg of Depo-Medrol injected subcutaneously over the left greater trochanteric region. Patient tolerated the procedure well Hypertension. Home blood pressure monitoring encouraged low-salt diet recommended recheck 1 month

## 2013-05-22 NOTE — Telephone Encounter (Signed)
Patient Information:  Caller Name: Kathie Rhodes  Phone: [phone]  Patient: Anthony [name], Anthony [name]  Gender: Male  DOB: [birth year]  Age: 52 Years  PCP: Birdie Sons (Adults only)  Office Follow Up:  Does the office need to follow up with this patient?: No  Instructions For The Office: N/A  RN Note:  RN advised to f/u with Provider if not greatly improved w/i 5-10 days per home instructions.   Symptoms  Reason For Call & Symptoms: Caller checking Metformin and Glypizide medication and how long pt needs to wait for cortisone shot to take place.  Advised caller about the dosing change of Metformin.  Only Glypizide at pharmacy.   Called pharmacy on file and verified Metformin script was there and ready for pick up.    Pt not at home to do triage.   Reviewed Health History In EMR: Yes  Reviewed Medications In EMR: Yes  Reviewed Allergies In EMR: Yes  Reviewed Surgeries / Procedures: Yes  Date of Onset of Symptoms: 05/19/2013  Guideline(s) Used:  No Protocol Available - Information Only  Disposition Per Guideline:   Home Care  Reason For Disposition Reached:   Information only question and nurse able to answer  Advice Given:  Call Back If:  You become worse.  Patient Will Follow Care Advice:  YES

## 2013-07-03 ENCOUNTER — Ambulatory Visit (INDEPENDENT_AMBULATORY_CARE_PROVIDER_SITE_OTHER): Payer: PRIVATE HEALTH INSURANCE | Admitting: Internal Medicine

## 2013-07-03 ENCOUNTER — Encounter: Payer: Self-pay | Admitting: Internal Medicine

## 2013-07-03 VITALS — BP 132/80 | HR 84 | Temp 98.1°F | Resp 20 | Wt 203.0 lb

## 2013-07-03 DIAGNOSIS — E119 Type 2 diabetes mellitus without complications: Secondary | ICD-10-CM

## 2013-07-03 DIAGNOSIS — I10 Essential (primary) hypertension: Secondary | ICD-10-CM

## 2013-07-03 LAB — GLUCOSE, POCT (MANUAL RESULT ENTRY): POC Glucose: 111 mg/dl — AB (ref 70–99)

## 2013-07-03 NOTE — Patient Instructions (Signed)
Please check your hemoglobin A1c every 3 months  Limit your sodium (Salt) intake    It is important that you exercise regularly, at least 20 minutes 3 to 4 times per week.  If you develop chest pain or shortness of breath seek  medical attention.   

## 2013-07-03 NOTE — Progress Notes (Signed)
Subjective:    Patient ID: Anthony [name], male    DOB: 02-05-1961, 52 y.o.   MRN: [id number]  HPI  52 year old patient who is seen today for followup of type 2 diabetes and hypertension. He has a history of mild dyslipidemia. Remains on atorvastatin 40. He does monitor blood pressure readings and blood sugars at home. His weight is down modestly. Blood sugar control has improved. His last hemoglobin A1c was elevated one month ago  Denies any cardiopulmonary complaints He was seen one month ago with a left greater trochanteric bursitis that has resolved.\ Past Medical History  Diagnosis Date  . Anxiety   . GERD (gastroesophageal reflux disease)   . Hyperlipidemia   . Diabetes mellitus   . ADD (attention deficit disorder with hyperactivity)     History   Social History  . Marital Status: Single    Spouse Name: N/A    Number of Children: N/A  . Years of Education: N/A   Occupational History  . Not on file.   Social History Main Topics  . Smoking status: Former Smoker    Quit date: 08/24/1996  . Smokeless tobacco: Not on file  . Alcohol Use: Yes  . Drug Use: No  . Sexually Active:    Other Topics Concern  . Not on file   Social History Narrative  . No narrative on file    Past Surgical History  Procedure Laterality Date  . Pilonidal cyst excision      Family History  Problem Relation Age of Onset  . Brain cancer Mother   . Diabetes Father   . Diabetes Sister     No Known Allergies  Current Outpatient Prescriptions on File Prior to Visit  Medication Sig Dispense Refill  . ACCU-CHEK SOFTCLIX LANCETS lancets 1 each by Other route daily. Use as instructed       . aspirin 81 MG tablet Take 81 mg by mouth daily.        Marland Kitchen atorvastatin (LIPITOR) 40 MG tablet Take 1 tablet (40 mg total) by mouth daily.  90 tablet  3  . glipiZIDE (GLIPIZIDE XL) 5 MG 24 hr tablet Take 1 tablet (5 mg total) by mouth daily.  90 tablet  3  . glucose blood (ACCU-CHEK COMPACT STRIPS) test  strip 1 each by Other route daily. Use as instructed       . labetalol (NORMODYNE) 200 MG tablet TAKE ONE TABLET BY MOUTH TWICE A DAY  180 tablet  1  . lisinopril (PRINIVIL,ZESTRIL) 40 MG tablet TAKE ONE (1) TABLET BY MOUTH EVERY      DAY  90 tablet  1  . metFORMIN (GLUCOPHAGE) 1000 MG tablet Take 1 tablet (1,000 mg total) by mouth 2 (two) times daily with a meal.  180 tablet  3  . sertraline (ZOLOFT) 100 MG tablet TAKE ONE (1) TABLET BY MOUTH EVERY      DAY  30 tablet  6   No current facility-administered medications on file prior to visit.    BP 132/80  Pulse 84  Temp(Src) 98.1 F (36.7 C) (Oral)  Resp 20  Wt 203 lb (92.08 kg)  BMI 30.87 kg/m2  SpO2 98%       Review of Systems  Constitutional: Negative for fever, chills, appetite change and fatigue.  HENT: Negative for hearing loss, ear pain, congestion, sore throat, trouble swallowing, neck stiffness, dental problem, voice change and tinnitus.   Eyes: Negative for pain, discharge and visual disturbance.  Respiratory: Negative for cough,  chest tightness, wheezing and stridor.   Cardiovascular: Negative for chest pain, palpitations and leg swelling.  Gastrointestinal: Negative for nausea, vomiting, abdominal pain, diarrhea, constipation, blood in stool and abdominal distention.  Genitourinary: Negative for urgency, hematuria, flank pain, discharge, difficulty urinating and genital sores.  Musculoskeletal: Negative for myalgias, back pain, joint swelling, arthralgias and gait problem.  Skin: Negative for rash.  Neurological: Negative for dizziness, syncope, speech difficulty, weakness, numbness and headaches.  Hematological: Negative for adenopathy. Does not bruise/bleed easily.  Psychiatric/Behavioral: Negative for behavioral problems and dysphoric mood. The patient is not nervous/anxious.        Objective:   Physical Exam  Constitutional: He is oriented to person, place, and time. He appears well-developed.  HENT:  Head:  Normocephalic.  Right Ear: External ear normal.  Left Ear: External ear normal.  Eyes: Conjunctivae and EOM are normal.  Neck: Normal range of motion.  Cardiovascular: Normal rate and normal heart sounds.   Pulmonary/Chest: Breath sounds normal.  Abdominal: Bowel sounds are normal.  Musculoskeletal: Normal range of motion. He exhibits no edema and no tenderness.  Neurological: He is alert and oriented to person, place, and time.  Psychiatric: He has a normal mood and affect. His behavior is normal.          Assessment & Plan:   Hypertension well controlled Diabetes mellitus. Encouraged additional weight loss and a more rigorous exercise regimen. Will recheck a hemoglobin A1c in 3 months Dyslipidemia. Continue atorvastatin

## 2013-07-03 NOTE — Progress Notes (Signed)
  Subjective:    Patient ID: Anthony [name], male    DOB: [birth year], 52 y.o.   MRN: [id number]  HPI Wt Readings from Last 3 Encounters:  07/03/13 203 lb (92.08 kg)  05/19/13 206 lb (93.441 kg)  10/04/12 193 lb (87.544 kg)     Review of Systems     Objective:   Physical Exam        Assessment & Plan:

## 2013-08-18 ENCOUNTER — Ambulatory Visit (INDEPENDENT_AMBULATORY_CARE_PROVIDER_SITE_OTHER): Payer: PRIVATE HEALTH INSURANCE | Admitting: Internal Medicine

## 2013-08-18 ENCOUNTER — Encounter: Payer: Self-pay | Admitting: Internal Medicine

## 2013-08-18 VITALS — BP 136/90 | HR 84 | Temp 98.1°F | Wt 205.0 lb

## 2013-08-18 DIAGNOSIS — Z23 Encounter for immunization: Secondary | ICD-10-CM

## 2013-08-18 DIAGNOSIS — I10 Essential (primary) hypertension: Secondary | ICD-10-CM

## 2013-08-18 DIAGNOSIS — E785 Hyperlipidemia, unspecified: Secondary | ICD-10-CM

## 2013-08-18 DIAGNOSIS — E669 Obesity, unspecified: Secondary | ICD-10-CM | POA: Insufficient documentation

## 2013-08-18 DIAGNOSIS — E119 Type 2 diabetes mellitus without complications: Secondary | ICD-10-CM

## 2013-08-18 DIAGNOSIS — E1159 Type 2 diabetes mellitus with other circulatory complications: Secondary | ICD-10-CM

## 2013-08-18 DIAGNOSIS — Z0000 Encounter for general adult medical examination without abnormal findings: Secondary | ICD-10-CM

## 2013-08-18 LAB — LIPID PANEL
LDL Cholesterol: 74 mg/dL (ref 0–99)
Total CHOL/HDL Ratio: 2

## 2013-08-18 LAB — MICROALBUMIN / CREATININE URINE RATIO
Creatinine,U: 173.9 mg/dL
Microalb Creat Ratio: 0.5 mg/g (ref 0.0–30.0)

## 2013-08-18 LAB — BASIC METABOLIC PANEL
BUN: 15 mg/dL (ref 6–23)
CO2: 23 mEq/L (ref 19–32)
Calcium: 9.3 mg/dL (ref 8.4–10.5)
Creatinine, Ser: 0.8 mg/dL (ref 0.4–1.5)
Glucose, Bld: 87 mg/dL (ref 70–99)

## 2013-08-18 LAB — HEPATIC FUNCTION PANEL
Alkaline Phosphatase: 60 U/L (ref 39–117)
Bilirubin, Direct: 0 mg/dL (ref 0.0–0.3)
Total Bilirubin: 0.5 mg/dL (ref 0.3–1.2)

## 2013-08-18 LAB — HEMOGLOBIN A1C: Hgb A1c MFr Bld: 6.3 % (ref 4.6–6.5)

## 2013-08-18 LAB — TSH: TSH: 0.43 u[IU]/mL (ref 0.35–5.50)

## 2013-08-18 NOTE — Progress Notes (Signed)
patient comes in for followup of multiple medical problems including type 2 diabetes, hyperlipidemia, hypertension. The patient does not check blood sugar or blood pressure at home. The patetient does not follow an exercise or diet program. The patient denies any polyuria, polydipsia.  In the past the patient has gone to diabetic treatment center. The patient is tolerating medications  Without difficulty. The patient does admit to medication compliance.   Past Medical History  Diagnosis Date  . Anxiety   . GERD (gastroesophageal reflux disease)   . Hyperlipidemia   . Diabetes mellitus   . ADD (attention deficit disorder with hyperactivity)     History   Social History  . Marital Status: Single    Spouse Name: N/A    Number of Children: N/A  . Years of Education: N/A   Occupational History  . Not on file.   Social History Main Topics  . Smoking status: Former Smoker    Quit date: 08/24/1996  . Smokeless tobacco: Not on file  . Alcohol Use: Yes  . Drug Use: No  . Sexual Activity:    Other Topics Concern  . Not on file   Social History Narrative  . No narrative on file    Past Surgical History  Procedure Laterality Date  . Pilonidal cyst excision      Family History  Problem Relation Age of Onset  . Brain cancer Mother   . Diabetes Father   . Diabetes Sister     No Known Allergies  Current Outpatient Prescriptions on File Prior to Visit  Medication Sig Dispense Refill  . ACCU-CHEK SOFTCLIX LANCETS lancets 1 each by Other route daily. Use as instructed       . aspirin 81 MG tablet Take 81 mg by mouth daily.        Marland Kitchen atorvastatin (LIPITOR) 40 MG tablet Take 1 tablet (40 mg total) by mouth daily.  90 tablet  3  . glipiZIDE (GLIPIZIDE XL) 5 MG 24 hr tablet Take 1 tablet (5 mg total) by mouth daily.  90 tablet  3  . glucose blood (ACCU-CHEK COMPACT STRIPS) test strip 1 each by Other route daily. Use as instructed       . labetalol (NORMODYNE) 200 MG tablet TAKE ONE  TABLET BY MOUTH TWICE A DAY  180 tablet  1  . lisinopril (PRINIVIL,ZESTRIL) 40 MG tablet TAKE ONE (1) TABLET BY MOUTH EVERY      DAY  90 tablet  1  . metFORMIN (GLUCOPHAGE) 1000 MG tablet Take 1 tablet (1,000 mg total) by mouth 2 (two) times daily with a meal.  180 tablet  3  . sertraline (ZOLOFT) 100 MG tablet TAKE ONE (1) TABLET BY MOUTH EVERY      DAY  30 tablet  6   No current facility-administered medications on file prior to visit.     patient denies chest pain, shortness of breath, orthopnea. Denies lower extremity edema, abdominal pain, change in appetite, change in bowel movements. Patient denies rashes, musculoskeletal complaints. No other specific complaints in a complete review of systems.   BP 136/90  Pulse 84  Temp(Src) 98.1 F (36.7 C) (Oral)  Wt 205 lb (92.987 kg)  BMI 31.18 kg/m2  well-developed well-nourished male in no acute distress. HEENT exam atraumatic, normocephalic, neck supple without jugular venous distention. Chest clear to auscultation cardiac exam S1-S2 are regular. Abdominal exam overweight with bowel sounds, soft and nontender. Extremities no edema. Neurologic exam is alert with a normal gait.

## 2013-08-21 NOTE — Assessment & Plan Note (Signed)
BP Readings from Last 3 Encounters:  08/18/13 136/90  07/03/13 132/80  05/19/13 170/90   Fair control Continue meds and LOSEW weight

## 2013-08-21 NOTE — Assessment & Plan Note (Signed)
He needs to lose weight Discussed need for exercise and low calorie diet

## 2013-08-21 NOTE — Assessment & Plan Note (Signed)
Check labs Continue meds 

## 2013-08-23 NOTE — Progress Notes (Signed)
Quick Note:  Called and spoke with pt and pt is aware. ______ 

## 2013-09-18 ENCOUNTER — Other Ambulatory Visit: Payer: Self-pay | Admitting: Internal Medicine

## 2013-09-18 ENCOUNTER — Encounter: Payer: Self-pay | Admitting: Internal Medicine

## 2013-10-06 ENCOUNTER — Encounter: Payer: Self-pay | Admitting: Internal Medicine

## 2013-10-09 ENCOUNTER — Encounter: Payer: PRIVATE HEALTH INSURANCE | Admitting: Internal Medicine

## 2013-10-09 NOTE — Progress Notes (Signed)
patient comes in for followup of multiple medical problems including type 2 diabetes, hyperlipidemia, hypertension. The patient does not check blood sugar or blood pressure at home. The patetient does not follow an exercise or diet program. The patient denies any polyuria, polydipsia.  In the past the patient has gone to diabetic treatment center. The patient is tolerating medications  Without difficulty. The patient does admit to medication compliance.   Past Medical History  Diagnosis Date  . Anxiety   . GERD (gastroesophageal reflux disease)   . Hyperlipidemia   . Diabetes mellitus   . ADD (attention deficit disorder with hyperactivity)     History   Social History  . Marital Status: Single    Spouse Name: N/A    Number of Children: N/A  . Years of Education: N/A   Occupational History  . Not on file.   Social History Main Topics  . Smoking status: Former Smoker    Quit date: 08/24/1996  . Smokeless tobacco: Not on file  . Alcohol Use: Yes  . Drug Use: No  . Sexual Activity:    Other Topics Concern  . Not on file   Social History Narrative  . No narrative on file    Past Surgical History  Procedure Laterality Date  . Pilonidal cyst excision      Family History  Problem Relation Age of Onset  . Brain cancer Mother   . Diabetes Father   . Diabetes Sister     No Known Allergies  Current Outpatient Prescriptions on File Prior to Visit  Medication Sig Dispense Refill  . ACCU-CHEK SOFTCLIX LANCETS lancets 1 each by Other route daily. Use as instructed       . aspirin 81 MG tablet Take 81 mg by mouth daily.        Marland Kitchen atorvastatin (LIPITOR) 40 MG tablet Take 1 tablet (40 mg total) by mouth daily.  90 tablet  3  . glipiZIDE (GLIPIZIDE XL) 5 MG 24 hr tablet Take 1 tablet (5 mg total) by mouth daily.  90 tablet  3  . glucose blood (ACCU-CHEK COMPACT STRIPS) test strip 1 each by Other route daily. Use as instructed       . labetalol (NORMODYNE) 200 MG tablet TAKE ONE  TABLET BY MOUTH TWICE A DAY  180 tablet  1  . lisinopril (PRINIVIL,ZESTRIL) 40 MG tablet TAKE ONE (1) TABLET BY MOUTH EVERY DAY  90 tablet  1  . metFORMIN (GLUCOPHAGE) 1000 MG tablet Take 1 tablet (1,000 mg total) by mouth 2 (two) times daily with a meal.  180 tablet  3  . nabumetone (RELAFEN) 750 MG tablet Take 1 tablet by mouth 2 (two) times daily.      . sertraline (ZOLOFT) 100 MG tablet TAKE ONE (1) TABLET BY MOUTH EVERY      DAY  30 tablet  6  . trimethoprim-polymyxin b (POLYTRIM) ophthalmic solution Place 1 drop into the right eye daily.       No current facility-administered medications on file prior to visit.     patient denies chest pain, shortness of breath, orthopnea. Denies lower extremity edema, abdominal pain, change in appetite, change in bowel movements. Patient denies rashes, musculoskeletal complaints. No other specific complaints in a complete review of systems.   There were no vitals taken for this visit.  well-developed well-nourished male in no acute distress. HEENT exam atraumatic, normocephalic, neck supple without jugular venous distention. Chest clear to auscultation cardiac exam S1-S2 are regular. Abdominal  exam overweight with bowel sounds, soft and nontender. Extremities no edema. Neurologic exam is alert with a normal gait.  This encounter was created in error - please disregard.

## 2014-02-14 ENCOUNTER — Telehealth: Payer: Self-pay | Admitting: Internal Medicine

## 2014-02-14 MED ORDER — GLUCOSE BLOOD VI STRP: 1.0000 | ORAL_STRIP | Freq: Every day | Status: DC

## 2014-02-14 MED ORDER — ACCU-CHEK SOFTCLIX LANCET DEV MISC: Status: DC

## 2014-02-14 NOTE — Telephone Encounter (Signed)
Pt needs refills on accu chek lancets and accu-chek test strips sent to [facility] in [place] [phone]

## 2014-02-14 NOTE — Telephone Encounter (Signed)
rx sent in electronically 

## 2014-03-22 ENCOUNTER — Other Ambulatory Visit: Payer: Self-pay | Admitting: Internal Medicine

## 2014-04-24 LAB — HM DIABETES EYE EXAM

## 2014-04-27 ENCOUNTER — Encounter: Payer: Self-pay | Admitting: Internal Medicine

## 2014-05-03 ENCOUNTER — Encounter (HOSPITAL_COMMUNITY): Payer: Self-pay | Admitting: Dietician

## 2014-05-03 NOTE — Progress Notes (Signed)
[facility] Diabetes Class Completion  Date:May 03, 2014  Time: 1730  [facility] attended  [facility] on May 03, 2014.   Patient was educated on the following topics:   -Survival skills (signs and symptoms of hyperglycemia and hypoglycemia, treatment for hypoglycemia, ideal levels for fasting and postprandial blood sugars, goal Hgb A1c level, foot care basics)  -Recommendations for physical activity   -Carbohydrate metabolism in relation to diabetes   -Meal planning (sources of carbohydrate, carbohydrate counting, meal planning strategies, food label reading, and portion control).  Handouts provided:  -"Diabetes and You: Taking Charge of Your Health"  -"Carbohydrate Counting and Meal Planning"  -"Your Guide to Better Office Visits"   Enzio Buchler A. Justin Meisenheimer, RD, LDN  

## 2014-05-18 ENCOUNTER — Other Ambulatory Visit: Payer: Self-pay | Admitting: Internal Medicine

## 2014-05-29 ENCOUNTER — Ambulatory Visit (INDEPENDENT_AMBULATORY_CARE_PROVIDER_SITE_OTHER): Payer: PRIVATE HEALTH INSURANCE | Admitting: Internal Medicine

## 2014-05-29 ENCOUNTER — Encounter: Payer: Self-pay | Admitting: Internal Medicine

## 2014-05-29 VITALS — BP 160/90 | HR 79 | Temp 98.0°F | Resp 20 | Ht 68.0 in | Wt 200.0 lb

## 2014-05-29 DIAGNOSIS — I10 Essential (primary) hypertension: Secondary | ICD-10-CM

## 2014-05-29 DIAGNOSIS — E119 Type 2 diabetes mellitus without complications: Secondary | ICD-10-CM

## 2014-05-29 DIAGNOSIS — E669 Obesity, unspecified: Secondary | ICD-10-CM

## 2014-05-29 DIAGNOSIS — E785 Hyperlipidemia, unspecified: Secondary | ICD-10-CM

## 2014-05-29 LAB — HEMOGLOBIN A1C: HEMOGLOBIN A1C: 5.7 % (ref 4.6–6.5)

## 2014-05-29 MED ORDER — HYDROCHLOROTHIAZIDE 12.5 MG PO TABS: 12.5000 mg | ORAL_TABLET | Freq: Every day | ORAL | Status: DC

## 2014-05-29 NOTE — Progress Notes (Signed)
Subjective:    Patient ID: Anthony [name], male    DOB: [birth year], 53 y.o.   MRN: [id number]  HPI  53 year old patient who is seen today for followup.  He has type 2 diabetes, which she has been well controlled.  He continues frequent home blood sugar monitoring with nice results.  His last hemoglobin A1c 6 point 3 He has treated hypertension, also monitors home blood pressure readings.  His blood pressure readings have been consistently elevated. No known concerns or complaints Medical regimen includes atorvastatin, which she continues to tolerate  Denies any cardiopulmonary complaints  Past Medical History  Diagnosis Date  . Anxiety   . GERD (gastroesophageal reflux disease)   . Hyperlipidemia   . Diabetes mellitus   . ADD (attention deficit disorder with hyperactivity)     History   Social History  . Marital Status: Single    Spouse Name: N/A    Number of Children: N/A  . Years of Education: N/A   Occupational History  . Not on file.   Social History Main Topics  . Smoking status: Former Smoker    Quit date: 08/24/1996  . Smokeless tobacco: Not on file  . Alcohol Use: Yes  . Drug Use: No  . Sexual Activity:    Other Topics Concern  . Not on file   Social History Narrative  . No narrative on file    Past Surgical History  Procedure Laterality Date  . Pilonidal cyst excision      Family History  Problem Relation Age of Onset  . Brain cancer Mother   . Diabetes Father   . Diabetes Sister     No Known Allergies  Current Outpatient Prescriptions on File Prior to Visit  Medication Sig Dispense Refill  . ACCU-CHEK SOFTCLIX LANCETS lancets 1 each by Other route daily. Use as instructed       . aspirin 81 MG tablet Take 81 mg by mouth daily.        Marland Kitchen atorvastatin (LIPITOR) 40 MG tablet TAKE ONE (1) TABLET BY MOUTH EVERY DAY  90 tablet  1  . glipiZIDE (GLUCOTROL XL) 5 MG 24 hr tablet TAKE ONE (1) TABLET BY MOUTH EVERY DAY  90 tablet  1  . glucose blood  (ACCU-CHEK COMPACT STRIPS) test strip 1 each by Other route daily.  100 each  5  . labetalol (NORMODYNE) 200 MG tablet TAKE ONE TABLET TWICE DAILY  180 tablet  0  . Lancet Devices (ACCU-CHEK SOFTCLIX) lancets Use as instructed  1 each  5  . lisinopril (PRINIVIL,ZESTRIL) 40 MG tablet TAKE ONE (1) TABLET EACH DAY  90 tablet  0  . metFORMIN (GLUCOPHAGE) 1000 MG tablet TAKE ONE TABLET TWICE DAILY WITH MEALS  180 tablet  1   No current facility-administered medications on file prior to visit.    BP 160/90  Pulse 79  Temp(Src) 98 F (36.7 C) (Oral)  Resp 20  Ht 5\' 8"  (1.727 m)  Wt 200 lb (90.719 kg)  BMI 30.42 kg/m2  SpO2 98%       Review of Systems  Constitutional: Negative for fever, chills, appetite change and fatigue.  HENT: Negative for congestion, dental problem, ear pain, hearing loss, sore throat, tinnitus, trouble swallowing and voice change.   Eyes: Negative for pain, discharge and visual disturbance.  Respiratory: Negative for cough, chest tightness, wheezing and stridor.   Cardiovascular: Negative for chest pain, palpitations and leg swelling.  Gastrointestinal: Negative for nausea, vomiting, abdominal pain, diarrhea,  constipation, blood in stool and abdominal distention.  Genitourinary: Negative for urgency, hematuria, flank pain, discharge, difficulty urinating and genital sores.  Musculoskeletal: Negative for arthralgias, back pain, gait problem, joint swelling, myalgias and neck stiffness.  Skin: Negative for rash.  Neurological: Negative for dizziness, syncope, speech difficulty, weakness, numbness and headaches.  Hematological: Negative for adenopathy. Does not bruise/bleed easily.  Psychiatric/Behavioral: Negative for behavioral problems and dysphoric mood. The patient is not nervous/anxious.        Objective:   Physical Exam  Constitutional: He is oriented to person, place, and time. He appears well-developed.  HENT:  Head: Normocephalic.  Right Ear:  External ear normal.  Left Ear: External ear normal.  Eyes: Conjunctivae and EOM are normal.  Neck: Normal range of motion.  Cardiovascular: Normal rate, normal heart sounds and intact distal pulses.   Pulmonary/Chest: Breath sounds normal.  Abdominal: Bowel sounds are normal.  Musculoskeletal: Normal range of motion. He exhibits no edema and no tenderness.  Neurological: He is alert and oriented to person, place, and time.  Psychiatric: He has a normal mood and affect. His behavior is normal.          Assessment & Plan:   Diabetes mellitus.  Well controlled.  Normal diabetic foot examination.  We'll check a hemoglobin A1c Hypertension suboptimal control.  We'll add hydrochlorothiazide 12 point 5 mg daily.  Will continue home blood pressure monitoring Dyslipidemia.  Continue atorvastatin Obesity.  Weight loss encouraged

## 2014-05-29 NOTE — Progress Notes (Signed)
Pre-visit discussion using our clinic review tool. No additional management support is needed unless otherwise documented below in the visit note.  

## 2014-05-29 NOTE — Patient Instructions (Signed)
Limit your sodium (Salt) intake   Please check your hemoglobin A1c every 3 months    It is important that you exercise regularly, at least 20 minutes 3 to 4 times per week.  If you develop chest pain or shortness of breath seek  medical attention.  You need to lose weight.  Consider a lower calorie diet and regular exercise. 

## 2014-05-30 ENCOUNTER — Telehealth: Payer: Self-pay | Admitting: Internal Medicine

## 2014-05-30 NOTE — Telephone Encounter (Signed)
Relevant patient education mailed to patient.  

## 2014-07-04 ENCOUNTER — Telehealth: Payer: Self-pay | Admitting: Internal Medicine

## 2014-07-04 NOTE — Telephone Encounter (Signed)
ok 

## 2014-07-04 NOTE — Telephone Encounter (Signed)
Mom would like to know if you will accept pt as your pt? She is [facility], also your pt

## 2014-07-05 NOTE — Telephone Encounter (Signed)
appt sch °

## 2014-09-17 ENCOUNTER — Ambulatory Visit: Payer: PRIVATE HEALTH INSURANCE | Admitting: Internal Medicine

## 2014-09-24 ENCOUNTER — Encounter: Payer: Self-pay | Admitting: Internal Medicine

## 2014-09-24 ENCOUNTER — Ambulatory Visit (INDEPENDENT_AMBULATORY_CARE_PROVIDER_SITE_OTHER): Payer: PRIVATE HEALTH INSURANCE | Admitting: Internal Medicine

## 2014-09-24 VITALS — BP 150/90 | HR 87 | Temp 98.1°F | Resp 20 | Ht 68.0 in | Wt 197.0 lb

## 2014-09-24 DIAGNOSIS — Z0000 Encounter for general adult medical examination without abnormal findings: Secondary | ICD-10-CM

## 2014-09-24 DIAGNOSIS — E119 Type 2 diabetes mellitus without complications: Secondary | ICD-10-CM

## 2014-09-24 DIAGNOSIS — E7800 Pure hypercholesterolemia, unspecified: Secondary | ICD-10-CM

## 2014-09-24 DIAGNOSIS — Z23 Encounter for immunization: Secondary | ICD-10-CM

## 2014-09-24 DIAGNOSIS — I10 Essential (primary) hypertension: Secondary | ICD-10-CM

## 2014-09-24 LAB — CBC WITH DIFFERENTIAL/PLATELET
BASOS PCT: 0.7 % (ref 0.0–3.0)
Basophils Absolute: 0.1 10*3/uL (ref 0.0–0.1)
EOS ABS: 0.1 10*3/uL (ref 0.0–0.7)
EOS PCT: 1.1 % (ref 0.0–5.0)
HCT: 41.6 % (ref 39.0–52.0)
HEMOGLOBIN: 14.1 g/dL (ref 13.0–17.0)
LYMPHS PCT: 21.8 % (ref 12.0–46.0)
Lymphs Abs: 1.7 10*3/uL (ref 0.7–4.0)
MCHC: 33.8 g/dL (ref 30.0–36.0)
MCV: 92.5 fl (ref 78.0–100.0)
MONO ABS: 0.5 10*3/uL (ref 0.1–1.0)
Monocytes Relative: 6.3 % (ref 3.0–12.0)
NEUTROS ABS: 5.6 10*3/uL (ref 1.4–7.7)
NEUTROS PCT: 70.1 % (ref 43.0–77.0)
Platelets: 229 10*3/uL (ref 150.0–400.0)
RBC: 4.49 Mil/uL (ref 4.22–5.81)
RDW: 13.1 % (ref 11.5–15.5)
WBC: 7.9 10*3/uL (ref 4.0–10.5)

## 2014-09-24 LAB — LIPID PANEL
CHOLESTEROL: 230 mg/dL — AB (ref 0–200)
HDL: 62.8 mg/dL (ref >39.00)
NONHDL: 167.2
TRIGLYCERIDES: 203 mg/dL — AB (ref 0.0–149.0)
Total CHOL/HDL Ratio: 4
VLDL: 40.6 mg/dL — AB (ref 0.0–40.0)

## 2014-09-24 LAB — TSH: TSH: 0.34 u[IU]/mL — ABNORMAL LOW (ref 0.35–4.50)

## 2014-09-24 LAB — COMPREHENSIVE METABOLIC PANEL
ALT: 36 U/L (ref 0–53)
AST: 36 U/L (ref 0–37)
Albumin: 4.7 g/dL (ref 3.5–5.2)
Alkaline Phosphatase: 70 U/L (ref 39–117)
BUN: 21 mg/dL (ref 6–23)
CALCIUM: 9.6 mg/dL (ref 8.4–10.5)
CO2: 25 mEq/L (ref 19–32)
CREATININE: 1 mg/dL (ref 0.4–1.5)
Chloride: 101 mEq/L (ref 96–112)
GFR: 83.37 mL/min (ref >60.00)
Glucose, Bld: 140 mg/dL — ABNORMAL HIGH (ref 70–99)
Potassium: 4.7 mEq/L (ref 3.5–5.1)
Sodium: 139 mEq/L (ref 135–145)
Total Bilirubin: 0.8 mg/dL (ref 0.2–1.2)
Total Protein: 8.2 g/dL (ref 6.0–8.3)

## 2014-09-24 LAB — LDL CHOLESTEROL, DIRECT: Direct LDL: 134.1 mg/dL

## 2014-09-24 LAB — MICROALBUMIN / CREATININE URINE RATIO
Creatinine,U: 268.6 mg/dL
Microalb Creat Ratio: 2.9 mg/g (ref 0.0–30.0)
Microalb, Ur: 7.7 mg/dL — ABNORMAL HIGH (ref 0.0–1.9)

## 2014-09-24 LAB — HEMOGLOBIN A1C: Hgb A1c MFr Bld: 6.4 % (ref 4.6–6.5)

## 2014-09-24 LAB — PSA: PSA: 0.41 ng/mL (ref 0.10–4.00)

## 2014-09-24 MED ORDER — LABETALOL HCL 300 MG PO TABS: 300.0000 mg | ORAL_TABLET | Freq: Two times a day (BID) | ORAL | Status: DC

## 2014-09-24 NOTE — Patient Instructions (Addendum)
Limit your sodium (Salt) intake    It is important that you exercise regularly, at least 20 minutes 3 to 4 times per week.  If you develop chest pain or shortness of breath seek  medical attention.  You need to lose weight.  Consider a lower calorie diet and regular exercise.  Return in 6 months for follow-up  Schedule your colonoscopy to help detect colon cancer.  Please see your eye doctor yearly to check for diabetic eye damageHealth Maintenance A healthy lifestyle and preventative care can promote health and wellness.  Maintain regular health, dental, and eye exams.  Eat a healthy diet. Foods like vegetables, fruits, whole grains, low-fat dairy products, and lean protein foods contain the nutrients you need and are low in calories. Decrease your intake of foods high in solid fats, added sugars, and salt. Get information about a proper diet from your health care provider, if necessary.  Regular physical exercise is one of the most important things you can do for your health. Most adults should get at least 150 minutes of moderate-intensity exercise (any activity that increases your heart rate and causes you to sweat) each week. In addition, most adults need muscle-strengthening exercises on 2 or more days a week.   Maintain a healthy weight. The body mass index (BMI) is a screening tool to identify possible weight problems. It provides an estimate of body fat based on height and weight. Your health care provider can find your BMI and can help you achieve or maintain a healthy weight. For males 20 years and older:  A BMI below 18.5 is considered underweight.  A BMI of 18.5 to 24.9 is normal.  A BMI of 25 to 29.9 is considered overweight.  A BMI of 30 and above is considered obese.  Maintain normal blood lipids and cholesterol by exercising and minimizing your intake of saturated fat. Eat a balanced diet with plenty of fruits and vegetables. Blood tests for lipids and cholesterol  should begin at age 58 and be repeated every 5 years. If your lipid or cholesterol levels are high, you are over age 36, or you are at high risk for heart disease, you may need your cholesterol levels checked more frequently.Ongoing high lipid and cholesterol levels should be treated with medicines if diet and exercise are not working.  If you smoke, find out from your health care provider how to quit. If you do not use tobacco, do not start.  Lung cancer screening is recommended for adults aged 31-80 years who are at high risk for developing lung cancer because of a history of smoking. A yearly low-dose CT scan of the lungs is recommended for people who have at least a 30-pack-year history of smoking and are current smokers or have quit within the past 15 years. A pack year of smoking is smoking an average of 1 pack of cigarettes a day for 1 year (for example, a 30-pack-year history of smoking could mean smoking 1 pack a day for 30 years or 2 packs a day for 15 years). Yearly screening should continue until the smoker has stopped smoking for at least 15 years. Yearly screening should be stopped for people who develop a health problem that would prevent them from having lung cancer treatment.  If you choose to drink alcohol, do not have more than 2 drinks per day. One drink is considered to be 12 oz (360 mL) of beer, 5 oz (150 mL) of wine, or 1.5 oz (45 mL) of  liquor.  Avoid the use of street drugs. Do not share needles with anyone. Ask for help if you need support or instructions about stopping the use of drugs.  High blood pressure causes heart disease and increases the risk of stroke. Blood pressure should be checked at least every 1-2 years. Ongoing high blood pressure should be treated with medicines if weight loss and exercise are not effective.  If you are 2-85 years old, ask your health care provider if you should take aspirin to prevent heart disease.  Diabetes screening involves taking a  blood sample to check your fasting blood sugar level. This should be done once every 3 years after age 3 if you are at a normal weight and without risk factors for diabetes. Testing should be considered at a younger age or be carried out more frequently if you are overweight and have at least 1 risk factor for diabetes.  Colorectal cancer can be detected and often prevented. Most routine colorectal cancer screening begins at the age of 29 and continues through age 38. However, your health care provider may recommend screening at an earlier age if you have risk factors for colon cancer. On a yearly basis, your health care provider may provide home test kits to check for hidden blood in the stool. A small camera at the end of a tube may be used to directly examine the colon (sigmoidoscopy or colonoscopy) to detect the earliest forms of colorectal cancer. Talk to your health care provider about this at age 54 when routine screening begins. A direct exam of the colon should be repeated every 5-10 years through age 36, unless early forms of precancerous polyps or small growths are found.  People who are at an increased risk for hepatitis B should be screened for this virus. You are considered at high risk for hepatitis B if:  You were born in a country where hepatitis B occurs often. Talk with your health care provider about which countries are considered high risk.  Your parents were born in a high-risk country and you have not received a shot to protect against hepatitis B (hepatitis B vaccine).  You have HIV or AIDS.  You use needles to inject street drugs.  You live with, or have sex with, someone who has hepatitis B.  You are a man who has sex with other men (MSM).  You get hemodialysis treatment.  You take certain medicines for conditions like cancer, organ transplantation, and autoimmune conditions.  Hepatitis C blood testing is recommended for all people born from [birth year] through 1964 and any  individual with known risk factors for hepatitis C.  Healthy men should no longer receive prostate-specific antigen (PSA) blood tests as part of routine cancer screening. Talk to your health care provider about prostate cancer screening.  Testicular cancer screening is not recommended for adolescents or adult males who have no symptoms. Screening includes self-exam, a health care provider exam, and other screening tests. Consult with your health care provider about any symptoms you have or any concerns you have about testicular cancer.  Practice safe sex. Use condoms and avoid high-risk sexual practices to reduce the spread of sexually transmitted infections (STIs).  You should be screened for STIs, including gonorrhea and chlamydia if:  You are sexually active and are younger than 24 years.  You are older than 24 years, and your health care provider tells you that you are at risk for this type of infection.  Your sexual activity has changed  since you were last screened, and you are at an increased risk for chlamydia or gonorrhea. Ask your health care provider if you are at risk.  If you are at risk of being infected with HIV, it is recommended that you take a prescription medicine daily to prevent HIV infection. This is called pre-exposure prophylaxis (PrEP). You are considered at risk if:  You are a man who has sex with other men (MSM).  You are a heterosexual man who is sexually active with multiple partners.  You take drugs by injection.  You are sexually active with a partner who has HIV.  Talk with your health care provider about whether you are at high risk of being infected with HIV. If you choose to begin PrEP, you should first be tested for HIV. You should then be tested every 3 months for as long as you are taking PrEP.  Use sunscreen. Apply sunscreen liberally and repeatedly throughout the day. You should seek shade when your shadow is shorter than you. Protect yourself by  wearing long sleeves, pants, a wide-brimmed hat, and sunglasses year round whenever you are outdoors.  Tell your health care provider of new moles or changes in moles, especially if there is a change in shape or color. Also, tell your health care provider if a mole is larger than the size of a pencil eraser.  A one-time screening for abdominal aortic aneurysm (AAA) and surgical repair of large AAAs by ultrasound is recommended for men aged 81-75 years who are current or former smokers.  Stay current with your vaccines (immunizations). Document Released: 06/04/2008 Document Revised: 12/12/2013 Document Reviewed: 05/04/2011 [employer] Patient Information 2015 [place], [place]. This information is not intended to replace advice given to you by your health care provider. Make sure you discuss any questions you have with your health care provider.

## 2014-09-24 NOTE — Progress Notes (Signed)
   Subjective:    Patient ID: Anthony [name], male    DOB: Oct 24, 1961, 53 y.o.   MRN: [id number]  HPI  Wt Readings from Last 3 Encounters:  09/24/14 197 lb (89.359 kg)  05/29/14 200 lb (90.719 kg)  08/18/13 205 lb (92.987 kg)    Review of Systems     Objective:   Physical Exam        Assessment & Plan:

## 2014-09-24 NOTE — Progress Notes (Signed)
Pre visit review using our clinic review tool, if applicable. No additional management support is needed unless otherwise documented below in the visit note. 

## 2014-09-24 NOTE — Progress Notes (Signed)
Subjective:    Patient ID: Anthony [name], male    DOB: 1961-05-21, 53 y.o.   MRN: [id number]  HPI  53 year old patient who is seen today for a preventive health examination.  He has type 2 diabetes, which has been well-controlled on oral therapy. He has hypertension and more recently, blood pressure readings have trended up.  Hydrochlorothiazide was added to his regimen.  Last visit.  He continues to have frequent readings in the 160 over 90 range He generally feels well He does have annual examinations Due for followup 10 year colonoscopy. He has dyslipidemia and is maintained on atorvastatin, which he continues to tolerate well. 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 Anxiety   . GERD (gastroesophageal reflux disease)   . Hyperlipidemia   . Diabetes mellitus   . ADD (attention deficit disorder with hyperactivity)     History   Social History  . Marital Status: Single    Spouse Name: N/A    Number of Children: N/A  . Years of Education: N/A   Occupational History  . Not on file.   Social History Main Topics  . Smoking status: Former Smoker    Quit date: 08/24/1996  . Smokeless tobacco: Not on file  . Alcohol Use: Yes  . Drug Use: No  . Sexual Activity:    Other Topics Concern  . Not on file   Social History Narrative  . No narrative on file    Past Surgical History  Procedure Laterality Date  . Pilonidal cyst excision      Family History  Problem Relation Age of Onset  . Brain cancer Mother   . Diabetes Father   . Diabetes Sister     No Known Allergies  Current Outpatient Prescriptions on File Prior to Visit  Medication Sig Dispense Refill  . ACCU-CHEK SOFTCLIX LANCETS lancets 1 each by Other route daily. Use as instructed       . aspirin 81 MG tablet Take 81 mg by mouth daily.        Marland Kitchen atorvastatin (LIPITOR) 40 MG tablet TAKE ONE (1) TABLET BY MOUTH EVERY DAY  90 tablet  1  . glipiZIDE  (GLUCOTROL XL) 5 MG 24 hr tablet TAKE ONE (1) TABLET BY MOUTH EVERY DAY  90 tablet  1  . glucose blood (ACCU-CHEK COMPACT STRIPS) test strip 1 each by Other route daily.  100 each  5  . hydrochlorothiazide (HYDRODIURIL) 12.5 MG tablet Take 1 tablet (12.5 mg total) by mouth daily.  90 tablet  3  . labetalol (NORMODYNE) 200 MG tablet TAKE ONE TABLET TWICE DAILY  180 tablet  0  . Lancet Devices (ACCU-CHEK SOFTCLIX) lancets Use as instructed  1 each  5  . lisinopril (PRINIVIL,ZESTRIL) 40 MG tablet TAKE ONE (1) TABLET EACH DAY  90 tablet  0  . metFORMIN (GLUCOPHAGE) 1000 MG tablet TAKE ONE TABLET TWICE DAILY WITH MEALS  180 tablet  1   No current facility-administered medications on file prior to visit.    BP 150/90  Pulse 87  Temp(Src) 98.1 F (36.7 C) (Oral)  Resp 20  Ht 5\' 8"  (1.727 m)  Wt 197 lb (89.359 kg)  BMI 29.96 kg/m2  SpO2 96%     Review of Systems  Constitutional: Negative for fever, chills, activity change, appetite change and fatigue.  HENT: Negative for congestion, dental problem, ear pain, hearing loss, mouth sores, rhinorrhea, sinus pressure, sneezing, tinnitus, trouble  swallowing and voice change.   Eyes: Negative for photophobia, pain, redness and visual disturbance.  Respiratory: Negative for apnea, cough, choking, chest tightness, shortness of breath and wheezing.   Cardiovascular: Negative for chest pain, palpitations and leg swelling.  Gastrointestinal: Negative for nausea, vomiting, abdominal pain, diarrhea, constipation, blood in stool, abdominal distention, anal bleeding and rectal pain.  Genitourinary: Negative for dysuria, urgency, frequency, hematuria, flank pain, decreased urine volume, discharge, penile swelling, scrotal swelling, difficulty urinating, genital sores and testicular pain.  Musculoskeletal: Negative for arthralgias, back pain, gait problem, joint swelling, myalgias, neck pain and neck stiffness.  Skin: Negative for color change, rash and wound.   Neurological: Negative for dizziness, tremors, seizures, syncope, facial asymmetry, speech difficulty, weakness, light-headedness, numbness and headaches.  Hematological: Negative for adenopathy. Does not bruise/bleed easily.  Psychiatric/Behavioral: Negative for suicidal ideas, hallucinations, behavioral problems, confusion, sleep disturbance, self-injury, dysphoric mood, decreased concentration and agitation. The patient is not nervous/anxious.        Objective:   Physical Exam  Constitutional: He appears well-developed and well-nourished.  HENT:  Head: Normocephalic and atraumatic.  Right Ear: External ear normal.  Left Ear: External ear normal.  Nose: Nose normal.  Mouth/Throat: Oropharynx is clear and moist.  Eyes: Conjunctivae and EOM are normal. Pupils are equal, round, and reactive to light. No scleral icterus.  Neck: Normal range of motion. Neck supple. No JVD present. No thyromegaly present.  Cardiovascular: Regular rhythm, normal heart sounds and intact distal pulses.  Exam reveals no gallop and no friction rub.   No murmur heard. Pulmonary/Chest: Effort normal and breath sounds normal. He exhibits no tenderness.  Abdominal: Soft. Bowel sounds are normal. He exhibits no distension and no mass. There is no tenderness.  Genitourinary: Prostate normal and penis normal. Guaiac negative stool.  Musculoskeletal: Normal range of motion. He exhibits no edema and no tenderness.  Lymphadenopathy:    He has no cervical adenopathy.  Neurological: He is alert. He has normal reflexes. No cranial nerve deficit. Coordination normal.  Skin: Skin is warm and dry. No rash noted.  Psychiatric: He has a normal mood and affect. His behavior is normal.          Assessment & Plan:   Preventive health examination Hypertension, suboptimal control.  We'll uptitrate labetalol to 300 mg twice daily.  Recheck 4 weeks Dyslipidemia.  Continue atorvastatin Diabetes mellitus.  Appears to be well  controlled.  We'll check a hemoglobin A1c and urine for microalbumin  Followup colonoscopy

## 2014-09-26 ENCOUNTER — Encounter: Payer: Self-pay | Admitting: Internal Medicine

## 2014-09-28 ENCOUNTER — Telehealth: Payer: Self-pay | Admitting: Internal Medicine

## 2014-09-28 NOTE — Telephone Encounter (Signed)
Returning call to Butch Penny regarding pt's lab results

## 2014-10-01 NOTE — Telephone Encounter (Signed)
See result note.  

## 2014-10-22 ENCOUNTER — Encounter: Payer: Self-pay | Admitting: Internal Medicine

## 2014-10-22 ENCOUNTER — Ambulatory Visit (INDEPENDENT_AMBULATORY_CARE_PROVIDER_SITE_OTHER): Payer: PRIVATE HEALTH INSURANCE | Admitting: Internal Medicine

## 2014-10-22 VITALS — BP 100/62 | HR 86 | Temp 97.6°F | Resp 20 | Ht 68.0 in | Wt 201.0 lb

## 2014-10-22 DIAGNOSIS — I10 Essential (primary) hypertension: Secondary | ICD-10-CM

## 2014-10-22 DIAGNOSIS — Z0000 Encounter for general adult medical examination without abnormal findings: Secondary | ICD-10-CM

## 2014-10-22 NOTE — Progress Notes (Signed)
Pre visit review using our clinic review tool, if applicable. No additional management support is needed unless otherwise documented below in the visit note. 

## 2014-10-22 NOTE — Progress Notes (Signed)
Subjective:    Patient ID: Anthony [name], male    DOB: 22-Sep-1961, 53 y.o.   MRN: [id number]  HPI 53 year old patient who is seen today in follow-up.  He has been seen recently for an annual exam.  He has a history of diabetes, dyslipidemia and hypertension.  He is doing quite well. He is asking about a screening colonoscopy.  This concern apparently prompted his visit today.  He has no other concerns or complaints.  Laboratory studies from last month reviewed.  He has been compliant with Atorvastatin  Past Medical History  Diagnosis Date  . Anxiety   . GERD (gastroesophageal reflux disease)   . Hyperlipidemia   . Diabetes mellitus   . ADD (attention deficit disorder with hyperactivity)     History   Social History  . Marital Status: Single    Spouse Name: N/A    Number of Children: N/A  . Years of Education: N/A   Occupational History  . Not on file.   Social History Main Topics  . Smoking status: Former Smoker    Quit date: 08/24/1996  . Smokeless tobacco: Not on file  . Alcohol Use: Yes  . Drug Use: No  . Sexual Activity:    Other Topics Concern  . Not on file   Social History Narrative    Past Surgical History  Procedure Laterality Date  . Pilonidal cyst excision      Family History  Problem Relation Age of Onset  . Brain cancer Mother   . Diabetes Father   . Diabetes Sister     No Known Allergies  Current Outpatient Prescriptions on File Prior to Visit  Medication Sig Dispense Refill  . ACCU-CHEK SOFTCLIX LANCETS lancets 1 each by Other route daily. Use as instructed     . aspirin 81 MG tablet Take 81 mg by mouth daily.      Marland Kitchen atorvastatin (LIPITOR) 40 MG tablet TAKE ONE (1) TABLET BY MOUTH EVERY DAY 90 tablet 1  . glipiZIDE (GLUCOTROL XL) 5 MG 24 hr tablet TAKE ONE (1) TABLET BY MOUTH EVERY DAY 90 tablet 1  . glucose blood (ACCU-CHEK COMPACT STRIPS) test strip 1 each by Other route daily. 100 each 5  . hydrochlorothiazide (HYDRODIURIL) 12.5 MG  tablet Take 1 tablet (12.5 mg total) by mouth daily. 90 tablet 3  . labetalol (NORMODYNE) 300 MG tablet Take 1 tablet (300 mg total) by mouth 2 (two) times daily. 180 tablet 5  . Lancet Devices (ACCU-CHEK SOFTCLIX) lancets Use as instructed 1 each 5  . lisinopril (PRINIVIL,ZESTRIL) 40 MG tablet TAKE ONE (1) TABLET EACH DAY 90 tablet 0  . metFORMIN (GLUCOPHAGE) 1000 MG tablet TAKE ONE TABLET TWICE DAILY WITH MEALS 180 tablet 1   No current facility-administered medications on file prior to visit.    BP 100/62 mmHg  Pulse 86  Temp(Src) 97.6 F (36.4 C) (Oral)  Resp 20  Ht 5\' 8"  (1.727 m)  Wt 201 lb (91.173 kg)  BMI 30.57 kg/m2  SpO2 97%        Review of Systems  Constitutional: Negative for fever, chills, appetite change and fatigue.  HENT: Negative for congestion, dental problem, ear pain, hearing loss, sore throat, tinnitus, trouble swallowing and voice change.   Eyes: Negative for pain, discharge and visual disturbance.  Respiratory: Negative for cough, chest tightness, wheezing and stridor.   Cardiovascular: Negative for chest pain, palpitations and leg swelling.  Gastrointestinal: Negative for nausea, vomiting, abdominal pain, diarrhea, constipation, blood in  stool and abdominal distention.  Genitourinary: Negative for urgency, hematuria, flank pain, discharge, difficulty urinating and genital sores.  Musculoskeletal: Negative for myalgias, back pain, joint swelling, arthralgias, gait problem and neck stiffness.  Skin: Negative for rash.  Neurological: Negative for dizziness, syncope, speech difficulty, weakness, numbness and headaches.  Hematological: Negative for adenopathy. Does not bruise/bleed easily.  Psychiatric/Behavioral: Negative for behavioral problems and dysphoric mood. The patient is not nervous/anxious.        Objective:   Physical Exam  Constitutional: He appears well-developed and well-nourished. No distress.  Blood pressure well controlled           Assessment & Plan:   Health maintenance.  Risks and benefits of colonoscopy discussed.  Will schedule Diabetes mellitus.  Recheck 3 months.  Continue present regimen Hypertension, well-controlled

## 2014-10-22 NOTE — Patient Instructions (Signed)
Schedule your colonoscopy to help detect colon cancer.   Please check your hemoglobin A1c every 3 months  Limit your sodium (Salt) intake    It is important that you exercise regularly, at least 20 minutes 3 to 4 times per week.  If you develop chest pain or shortness of breath seek  medical attention.  You need to lose weight.  Consider a lower calorie diet and regular exercise.

## 2014-10-26 ENCOUNTER — Other Ambulatory Visit: Payer: Self-pay | Admitting: Internal Medicine

## 2014-11-19 ENCOUNTER — Ambulatory Visit (AMBULATORY_SURGERY_CENTER): Payer: Self-pay | Admitting: *Deleted

## 2014-11-19 VITALS — Ht 68.0 in | Wt 202.8 lb

## 2014-11-19 DIAGNOSIS — Z1211 Encounter for screening for malignant neoplasm of colon: Secondary | ICD-10-CM

## 2014-11-19 MED ORDER — MOVIPREP 100 G PO SOLR: 1.0000 | Freq: Once | ORAL | Status: DC

## 2014-11-19 NOTE — Progress Notes (Signed)
No egg or soy allergy. ewm No home 02 use. ewm No past sedation. ewm No past colonoscopy. ewm No diet pills. ewm Pt states he drinks 3-40 oz beers a day. ewm Pt has medicare and medicaid and wanted a prep that he didn't have to pay for so gave movi prep. ewm

## 2014-12-03 ENCOUNTER — Encounter: Payer: Self-pay | Admitting: Internal Medicine

## 2014-12-03 ENCOUNTER — Ambulatory Visit (AMBULATORY_SURGERY_CENTER): Payer: PRIVATE HEALTH INSURANCE | Admitting: Internal Medicine

## 2014-12-03 VITALS — BP 119/75 | HR 71 | Temp 97.5°F | Resp 16 | Ht 68.0 in | Wt 202.0 lb

## 2014-12-03 DIAGNOSIS — Z1211 Encounter for screening for malignant neoplasm of colon: Secondary | ICD-10-CM

## 2014-12-03 DIAGNOSIS — D123 Benign neoplasm of transverse colon: Secondary | ICD-10-CM

## 2014-12-03 MED ORDER — SODIUM CHLORIDE 0.9 % IV SOLN: 500.0000 mL | INTRAVENOUS | Status: DC

## 2014-12-03 NOTE — Op Note (Signed)
[place]  [address]. [place], [id number]   COLONOSCOPY PROCEDURE REPORT  PATIENT: Anthony [name], Anthony [name]  MR#: [id number] BIRTHDATE: [birth year] , 53  yrs. old GENDER: male ENDOSCOPIST: Gatha Mayer, MD, [facility] PROCEDURE DATE:  12/03/2014 PROCEDURE:   Colonoscopy with snare polypectomy First Screening Colonoscopy - Avg.  risk and is 50 yrs.  old or older Yes.  Prior Negative Screening - Now for repeat screening. N/A  History of Adenoma - Now for follow-up colonoscopy & has been > or = to 3 yrs.  N/A  Polyps Removed Today? Yes. ASA CLASS:   Class II INDICATIONS:first colonoscopy and average risk for colorectal cancer. MEDICATIONS: Propofol 250 mg IV and Monitored anesthesia care  DESCRIPTION OF PROCEDURE:   After the risks benefits and alternatives of the procedure were thoroughly explained, informed consent was obtained.  The digital rectal exam revealed no abnormalities of the rectum, revealed no prostatic nodules, and revealed the prostate was not enlarged.   The LB [id number] endoscope was introduced through the anus and advanced to the cecum, which was identified by both the appendix and ileocecal valve. No adverse events experienced.   The quality of the prep was good, using MiraLax  The instrument was then slowly withdrawn as the colon was fully examined.      COLON FINDINGS: A sessile polyp measuring 10 mm in size was found in the transverse colon.  A polypectomy was performed with a cold snare.  The resection was complete, the polyp tissue was completely retrieved and sent to histology.   The examination was otherwise normal.  Retroflexed views revealed no abnormalities. The time to cecum=1 minutes 10 seconds.  Withdrawal time=11 minutes 49 seconds. The scope was withdrawn and the procedure completed. COMPLICATIONS: There were no immediate complications.  ENDOSCOPIC IMPRESSION: 1.   Sessile polyp measuring 10 mm in size was found in  the transverse colon; polypectomy was performed with a cold snare 2.   The examination was otherwise normal - good prep - first screening  RECOMMENDATIONS: Timing of repeat colonoscopy will be determined by pathology findings.  eSigned:  Gatha Mayer, MD, [facility] 12/03/2014 11:04 AM   cc: Bluford Kaufmann, MD and The Patient

## 2014-12-03 NOTE — Progress Notes (Signed)
Stable to RR 

## 2014-12-03 NOTE — Progress Notes (Signed)
Called to room to assist during endoscopic procedure.  Patient ID and intended procedure confirmed with present staff. Received instructions for my participation in the procedure from the performing physician.  

## 2014-12-03 NOTE — Patient Instructions (Addendum)
I found and removed one polyp that looks benign.  I will let you know pathology results and when to have another routine colonoscopy by mail.  I appreciate the opportunity to care for you. Gatha Mayer, MD, FACG  YOU HAD AN ENDOSCOPIC PROCEDURE TODAY AT [facility]: Refer to the procedure report that was given to you for any specific questions about what was found during the examination.  If the procedure report does not answer your questions, please call your gastroenterologist to clarify.  If you requested that your care partner not be given the details of your procedure findings, then the procedure report has been included in a sealed envelope for you to review at your convenience later.  YOU SHOULD EXPECT: Some feelings of bloating in the abdomen. Passage of more gas than usual.  Walking can help get rid of the air that was put into your GI tract during the procedure and reduce the bloating. If you had a lower endoscopy (such as a colonoscopy or flexible sigmoidoscopy) you may notice spotting of blood in your stool or on the toilet paper. If you underwent a bowel prep for your procedure, then you may not have a normal bowel movement for a few days.  DIET: Your first meal following the procedure should be a light meal and then it is ok to progress to your normal diet.  A half-sandwich or bowl of soup is an example of a good first meal.  Heavy or fried foods are harder to digest and may make you feel nauseous or bloated.  Likewise meals heavy in dairy and vegetables can cause extra gas to form and this can also increase the bloating.  Drink plenty of fluids but you should avoid alcoholic beverages for 24 hours.  ACTIVITY: Your care partner should take you home directly after the procedure.  You should plan to take it easy, moving slowly for the rest of the day.  You can resume normal activity the day after the procedure however you should NOT DRIVE or use heavy machinery  for 24 hours (because of the sedation medicines used during the test).    SYMPTOMS TO REPORT IMMEDIATELY: A gastroenterologist can be reached at any hour.  During normal business hours, 8:30 AM to 5:00 PM Monday through Friday, call [phone].  After hours and on weekends, please call the GI answering service at [phone] who will take a message and have the physician on call contact you.   Following lower endoscopy (colonoscopy or flexible sigmoidoscopy):  Excessive amounts of blood in the stool  Significant tenderness or worsening of abdominal pains  Swelling of the abdomen that is new, acute  Fever of 100F or higher  FOLLOW UP: If any biopsies were taken you will be contacted by phone or by letter within the next 1-3 weeks.  Call your gastroenterologist if you have not heard about the biopsies in 3 weeks.  Our staff will call the home number listed on your records the next business day following your procedure to check on you and address any questions or concerns that you may have at that time regarding the information given to you following your procedure. This is a courtesy call and so if there is no answer at the home number and we have not heard from you through the emergency physician on call, we will assume that you have returned to your regular daily activities without incident.  SIGNATURES/CONFIDENTIALITY: You and/or your care partner have  signed paperwork which will be entered into your electronic medical record.  These signatures attest to the fact that that the information above on your After Visit Summary has been reviewed and is understood.  Full responsibility of the confidentiality of this discharge information lies with you and/or your care-partner.

## 2014-12-04 ENCOUNTER — Telehealth: Payer: Self-pay

## 2014-12-04 NOTE — Telephone Encounter (Signed)
  Follow up Call-  Call back number 12/03/2014  Post procedure Call Back phone  # [place]- [phone]  Permission to leave phone message Yes     Patient questions:  Do you have a fever, pain , or abdominal swelling? No. Pain Score  0 *  Have you tolerated food without any problems? Yes.    Have you been able to return to your normal activities? Yes.    Do you have any questions about your discharge instructions: Diet   No. Medications  No. Follow up visit  No.  Do you have questions or concerns about your Care? No.  Actions: * If pain score is 4 or above: No action needed, pain <4.  No problems noted per the pt. maw

## 2014-12-07 ENCOUNTER — Encounter: Payer: Self-pay | Admitting: Internal Medicine

## 2014-12-07 DIAGNOSIS — Z860100 Personal history of colon polyps, unspecified: Secondary | ICD-10-CM

## 2014-12-07 HISTORY — DX: Personal history of colonic polyps: Z86.010

## 2014-12-07 HISTORY — DX: Personal history of colon polyps, unspecified: Z86.0100

## 2014-12-07 NOTE — Progress Notes (Signed)
Quick Note:  10 mm ssp - repeat colon 3 yrs ______

## 2015-01-02 ENCOUNTER — Other Ambulatory Visit: Payer: Self-pay | Admitting: Internal Medicine

## 2015-02-14 DIAGNOSIS — L03039 Cellulitis of unspecified toe: Secondary | ICD-10-CM | POA: Diagnosis not present

## 2015-02-14 DIAGNOSIS — M79675 Pain in left toe(s): Secondary | ICD-10-CM | POA: Diagnosis not present

## 2015-02-14 DIAGNOSIS — L03032 Cellulitis of left toe: Secondary | ICD-10-CM | POA: Diagnosis not present

## 2015-02-18 ENCOUNTER — Emergency Department (HOSPITAL_COMMUNITY)
Admission: EM | Admit: 2015-02-18 | Discharge: 2015-02-18 | Disposition: A | Discharge status: Home / Self Care | Payer: Medicare Other | Attending: Emergency Medicine | Admitting: Emergency Medicine

## 2015-02-18 ENCOUNTER — Emergency Department (HOSPITAL_COMMUNITY): Payer: Medicare Other

## 2015-02-18 ENCOUNTER — Encounter (HOSPITAL_COMMUNITY): Payer: Self-pay | Admitting: *Deleted

## 2015-02-18 DIAGNOSIS — E1165 Type 2 diabetes mellitus with hyperglycemia: Secondary | ICD-10-CM | POA: Diagnosis not present

## 2015-02-18 DIAGNOSIS — Z86010 Personal history of colonic polyps: Secondary | ICD-10-CM | POA: Insufficient documentation

## 2015-02-18 DIAGNOSIS — L02612 Cutaneous abscess of left foot: Secondary | ICD-10-CM | POA: Diagnosis not present

## 2015-02-18 DIAGNOSIS — M79672 Pain in left foot: Secondary | ICD-10-CM | POA: Diagnosis not present

## 2015-02-18 DIAGNOSIS — L089 Local infection of the skin and subcutaneous tissue, unspecified: Secondary | ICD-10-CM | POA: Diagnosis not present

## 2015-02-18 DIAGNOSIS — R739 Hyperglycemia, unspecified: Secondary | ICD-10-CM

## 2015-02-18 DIAGNOSIS — Z79899 Other long term (current) drug therapy: Secondary | ICD-10-CM | POA: Insufficient documentation

## 2015-02-18 DIAGNOSIS — Z792 Long term (current) use of antibiotics: Secondary | ICD-10-CM | POA: Diagnosis not present

## 2015-02-18 DIAGNOSIS — F419 Anxiety disorder, unspecified: Secondary | ICD-10-CM | POA: Diagnosis not present

## 2015-02-18 DIAGNOSIS — K219 Gastro-esophageal reflux disease without esophagitis: Secondary | ICD-10-CM | POA: Insufficient documentation

## 2015-02-18 DIAGNOSIS — Z7982 Long term (current) use of aspirin: Secondary | ICD-10-CM | POA: Diagnosis not present

## 2015-02-18 LAB — CBC WITH DIFFERENTIAL/PLATELET
BASOS ABS: 0.1 10*3/uL (ref 0.0–0.1)
BASOS PCT: 1 % (ref 0–1)
EOS ABS: 0.1 10*3/uL (ref 0.0–0.7)
Eosinophils Relative: 1 % (ref 0–5)
HCT: 37.9 % — ABNORMAL LOW (ref 39.0–52.0)
Hemoglobin: 12.8 g/dL — ABNORMAL LOW (ref 13.0–17.0)
Lymphocytes Relative: 22 % (ref 12–46)
Lymphs Abs: 1.9 10*3/uL (ref 0.7–4.0)
MCH: 31.4 pg (ref 26.0–34.0)
MCHC: 33.8 g/dL (ref 30.0–36.0)
MCV: 93.1 fL (ref 78.0–100.0)
Monocytes Absolute: 0.6 10*3/uL (ref 0.1–1.0)
Monocytes Relative: 7 % (ref 3–12)
NEUTROS ABS: 6 10*3/uL (ref 1.7–7.7)
NEUTROS PCT: 69 % (ref 43–77)
PLATELETS: 232 10*3/uL (ref 150–400)
RBC: 4.07 MIL/uL — ABNORMAL LOW (ref 4.22–5.81)
RDW: 12.3 % (ref 11.5–15.5)
WBC: 8.5 10*3/uL (ref 4.0–10.5)

## 2015-02-18 LAB — CBG MONITORING, ED: GLUCOSE-CAPILLARY: 276 mg/dL — AB (ref 70–99)

## 2015-02-18 LAB — BASIC METABOLIC PANEL
Anion gap: 9 (ref 5–15)
BUN: 25 mg/dL — AB (ref 6–23)
CALCIUM: 9.6 mg/dL (ref 8.4–10.5)
CO2: 24 mmol/L (ref 19–32)
Chloride: 103 mmol/L (ref 96–112)
Creatinine, Ser: 1.13 mg/dL (ref 0.50–1.35)
GFR calc Af Amer: 85 mL/min — ABNORMAL LOW (ref >90)
GFR, EST NON AFRICAN AMERICAN: 73 mL/min — AB (ref >90)
GLUCOSE: 215 mg/dL — AB (ref 70–99)
Potassium: 4.1 mmol/L (ref 3.5–5.1)
Sodium: 136 mmol/L (ref 135–145)

## 2015-02-18 MED ORDER — VANCOMYCIN HCL IN DEXTROSE 1-5 GM/200ML-% IV SOLN
1000.0000 mg | Freq: Once | INTRAVENOUS | Status: AC
  Administered 2015-02-18: 1000 mg via INTRAVENOUS
  Filled 2015-02-18: qty 200

## 2015-02-18 MED ORDER — SODIUM CHLORIDE 0.9 % IV BOLUS (SEPSIS)
500.0000 mL | Freq: Once | INTRAVENOUS | Status: AC
  Administered 2015-02-18: 500 mL via INTRAVENOUS

## 2015-02-18 MED ORDER — HYDROCODONE-ACETAMINOPHEN 5-325 MG PO TABS: 1.0000 | ORAL_TABLET | Freq: Four times a day (QID) | ORAL | Status: DC | PRN

## 2015-02-18 MED ORDER — DOXYCYCLINE HYCLATE 100 MG PO CAPS: 100.0000 mg | ORAL_CAPSULE | Freq: Two times a day (BID) | ORAL | Status: DC

## 2015-02-18 MED ORDER — SODIUM CHLORIDE 0.9 % IV SOLN: INTRAVENOUS | Status: DC

## 2015-02-18 NOTE — ED Provider Notes (Signed)
CSN: [id number]     Arrival date & time 02/18/15  1440 History   This chart was scribed for Fredia Sorrow, MD by Dellis Filbert, ED Scribe. The patient was seen in [place] and the patient's care was started at 5:32 PM.  Chief Complaint  Patient presents with  . Foot Pain   Patient is a 54 y.o. male presenting with lower extremity pain. The history is provided by the patient. No language interpreter was used.  Foot Pain This is a new problem. The current episode started more than 2 days ago. The problem occurs constantly. The problem has not changed since onset.Pertinent negatives include no chest pain, no abdominal pain and no headaches. Nothing aggravates the symptoms. Nothing relieves the symptoms. The treatment provided no relief.    HPI Comments: Anthony [name] is a 54 y.o. male with a history of type II DM who presents to the Emergency Department complaining of acute left third toe pain rated as 9/10, onset 5 days ago. No trauma, but began as a wart and then became infected. He was seen at an urgent care twice, last visit was 4 days ago and I&D procedure was done and he was treated with bactrim and told not to soak. No improvement since being seen at the urgent care. Pt states there has been drainage from the wound.  He denies fever  Past Medical History  Diagnosis Date  . Anxiety   . GERD (gastroesophageal reflux disease)   . Hyperlipidemia   . Diabetes mellitus   . ADD (attention deficit disorder with hyperactivity)   . Hx of colonic polyp - sessile serrated polyp 12/07/2014   Past Surgical History  Procedure Laterality Date  . Pilonidal cyst excision     Family History  Problem Relation Age of Onset  . Brain cancer Mother   . Diabetes Father   . Diabetes Sister   . Colon cancer Neg Hx   . Esophageal cancer Neg Hx   . Rectal cancer Neg Hx   . Stomach cancer Neg Hx   . Stroke Brother   . Seizures Brother    History  Substance Use Topics  . Smoking status: Former  Smoker    Quit date: 08/24/1996  . Smokeless tobacco: Never Used  . Alcohol Use: 0.0 oz/week    0 Standard drinks or equivalent per week     Comment: 3-40 oz a day of beer     Review of Systems  Constitutional: Positive for chills. Negative for fever.  HENT: Negative for congestion, rhinorrhea and sore throat.   Eyes: Negative for visual disturbance.  Respiratory: Positive for cough.   Cardiovascular: Negative for chest pain and leg swelling.  Gastrointestinal: Negative for nausea, vomiting, abdominal pain and diarrhea.  Genitourinary: Negative for dysuria and hematuria.  Musculoskeletal: Negative for back pain.  Skin: Positive for color change and rash.  Neurological: Negative for headaches.  Hematological: Does not bruise/bleed easily.  Psychiatric/Behavioral: Negative for confusion.    Allergies  Review of patient's allergies indicates no known allergies.  Home Medications   Prior to Admission medications   Medication Sig Start Date End Date Taking? Authorizing Provider  aspirin 81 MG tablet Take 81 mg by mouth daily.     Yes Historical Provider, MD  atorvastatin (LIPITOR) 40 MG tablet TAKE ONE TABLET ONCE DAILY 01/02/15  Yes Marletta Lor, MD  glipiZIDE (GLUCOTROL XL) 5 MG 24 hr tablet TAKE ONE TABLET ONCE DAILY 01/02/15  Yes Marletta Lor, MD  hydrochlorothiazide (HYDRODIURIL) 12.5 MG tablet Take 1 tablet (12.5 mg total) by mouth daily. 05/29/14  Yes Marletta Lor, MD  labetalol (NORMODYNE) 300 MG tablet Take 1 tablet (300 mg total) by mouth 2 (two) times daily. 09/24/14  Yes Marletta Lor, MD  lisinopril (PRINIVIL,ZESTRIL) 40 MG tablet TAKE ONE (1) TABLET EACH DAY 10/26/14  Yes Marletta Lor, MD  metFORMIN (GLUCOPHAGE) 1000 MG tablet TAKE ONE TABLET TWICE DAILY WITH MEALS 01/02/15  Yes Marletta Lor, MD  sulfamethoxazole-trimethoprim (BACTRIM DS,SEPTRA DS) 800-160 MG per tablet Take 2 tablets by mouth 2 (two) times daily. 10 day course starting  on 02/14/2015   Yes Historical Provider, MD  doxycycline (VIBRAMYCIN) 100 MG capsule Take 1 capsule (100 mg total) by mouth 2 (two) times daily. 02/18/15   Fredia Sorrow, MD  glucose blood (ACCU-CHEK COMPACT STRIPS) test strip 1 each by Other route daily. 02/14/14   Lisabeth Pick, MD  HYDROcodone-acetaminophen (NORCO/VICODIN) 5-325 MG per tablet Take 1-2 tablets by mouth every 6 (six) hours as needed. 02/18/15   Fredia Sorrow, MD  Lancet Devices [facility]) lancets Use as instructed 02/14/14   Lisabeth Pick, MD   BP 123/80 mmHg  Pulse 88  Temp(Src) 97.6 F (36.4 C) (Oral)  Resp 16  Ht 5\' 8"  (1.727 m)  Wt 200 lb (90.719 kg)  BMI 30.42 kg/m2  SpO2 97% Physical Exam  Constitutional: He is oriented to person, place, and time. He appears well-developed and well-nourished.  HENT:  Head: Normocephalic and atraumatic.  Mouth/Throat: Oropharynx is clear and moist.  Eyes: Conjunctivae and EOM are normal. Pupils are equal, round, and reactive to light. No scleral icterus.  Cardiovascular: Normal rate, regular rhythm and normal heart sounds.   No murmur heard. Pulses:      Dorsalis pedis pulses are 2+ on the left side.  Pulmonary/Chest: Effort normal and breath sounds normal.  Lungs are clear bilaterally  Abdominal: Soft. Bowel sounds are normal. There is no tenderness.  Musculoskeletal: He exhibits no edema.  Neurological: He is alert and oriented to person, place, and time. He has normal reflexes. No cranial nerve deficit. He exhibits normal muscle tone. Coordination normal.  Skin: Rash noted. There is erythema.  Red on top of 3rd left toe with purulent discharge on the top of the nail. Macular redness that comes up into the forefoot, no streaking but looks to be developing.  Nursing note and vitals reviewed.   ED Course  Procedures  DIAGNOSTIC STUDIES: Oxygen Saturation is 98% on room air, normal by my interpretation.    COORDINATION OF CARE: 5:39 PM Discussed treatment plan  with pt at bedside and pt agreed to plan.   Labs Review Labs Reviewed  BASIC METABOLIC PANEL - Abnormal; Notable for the following:    Glucose, Bld 215 (*)    BUN 25 (*)    GFR calc non Af Amer 73 (*)    GFR calc Af Amer 85 (*)    All other components within normal limits  CBC WITH DIFFERENTIAL/PLATELET - Abnormal; Notable for the following:    RBC 4.07 (*)    Hemoglobin 12.8 (*)    HCT 37.9 (*)    All other components within normal limits  CBG MONITORING, ED - Abnormal; Notable for the following:    Glucose-Capillary 276 (*)    All other components within normal limits   Results for orders placed or performed during the hospital encounter of 35/00/93  Basic metabolic panel  Result Value  Ref Range   Sodium 136 135 - 145 mmol/L   Potassium 4.1 3.5 - 5.1 mmol/L   Chloride 103 96 - 112 mmol/L   CO2 24 19 - 32 mmol/L   Glucose, Bld 215 (H) 70 - 99 mg/dL   BUN 25 (H) 6 - 23 mg/dL   Creatinine, Ser 1.13 0.50 - 1.35 mg/dL   Calcium 9.6 8.4 - 10.5 mg/dL   GFR calc non Af Amer 73 (L) >90 mL/min   GFR calc Af Amer 85 (L) >90 mL/min   Anion gap 9 5 - 15  CBC with Differential/Platelet  Result Value Ref Range   WBC 8.5 4.0 - 10.5 K/uL   RBC 4.07 (L) 4.22 - 5.81 MIL/uL   Hemoglobin 12.8 (L) 13.0 - 17.0 g/dL   HCT 37.9 (L) 39.0 - 52.0 %   MCV 93.1 78.0 - 100.0 fL   MCH 31.4 26.0 - 34.0 pg   MCHC 33.8 30.0 - 36.0 g/dL   RDW 12.3 11.5 - 15.5 %   Platelets 232 150 - 400 K/uL   Neutrophils Relative % 69 43 - 77 %   Neutro Abs 6.0 1.7 - 7.7 K/uL   Lymphocytes Relative 22 12 - 46 %   Lymphs Abs 1.9 0.7 - 4.0 K/uL   Monocytes Relative 7 3 - 12 %   Monocytes Absolute 0.6 0.1 - 1.0 K/uL   Eosinophils Relative 1 0 - 5 %   Eosinophils Absolute 0.1 0.0 - 0.7 K/uL   Basophils Relative 1 0 - 1 %   Basophils Absolute 0.1 0.0 - 0.1 K/uL  CBG monitoring, ED  Result Value Ref Range   Glucose-Capillary 276 (H) 70 - 99 mg/dL    Imaging Review Dg Foot Complete Left  02/18/2015   CLINICAL  DATA:  Acute left 3rd toe pain, infected wart, status post I&D procedure  EXAM: LEFT FOOT - COMPLETE 3+ VIEW  COMPARISON:  None.  FINDINGS: No fracture or dislocation is seen.  The joint spaces are preserved.  Focal soft tissue swelling overlying the mid and distal phalanx.  No radiopaque foreign body is seen.  IMPRESSION: Soft tissue swelling along the distal 3rd digit.  No fracture, dislocation, or radiopaque foreign body is seen.   Electronically Signed   By: Julian Hy M.D.   On: 02/18/2015 18:23     EKG Interpretation None      MDM   Final diagnoses:  Toe infection  Hyperglycemia    Patient known diabetic. Patient being followed at urgent care or toe infection sounds like started as almost as a paronychial infection. Urgent care last week the "wound up. Started him on Septra. Toe is getting worse. Certainly have some redness forming into the forefoot. X-rays negative for any soft tissue gas. Any bony involvement. No significant leukocytosis. Patient received 1 g of IV vancomycin here. Will be discharged home on doxycycline and close follow-up. Soaking of the toe.  Blood sugars moderately elevated but no evidence of any acidosis.   I personally performed the services described in this documentation, which was scribed in my presence. The recorded information has been reviewed and is accurate.      Fredia Sorrow, MD 02/18/15 [phone]

## 2015-02-18 NOTE — Discharge Instructions (Signed)
Continue the new antibiotic starting tomorrow. Take as directed. Take pain medicine as needed. Soak the foot in warm water for 20 minutes twice a day. Follow-up with your doctor next week. Return for any new or worse symptoms. Follow your blood sugars closely.

## 2015-02-18 NOTE — ED Notes (Signed)
Lt 3rd toe  Swollen , purulent d/c  Sent  Here for  Urgent care.

## 2015-02-18 NOTE — ED Notes (Signed)
Pedal pulses strong bilaterally.

## 2015-05-22 DIAGNOSIS — E119 Type 2 diabetes mellitus without complications: Secondary | ICD-10-CM | POA: Diagnosis not present

## 2015-05-22 LAB — HM DIABETES EYE EXAM

## 2015-05-29 ENCOUNTER — Encounter: Payer: Self-pay | Admitting: Internal Medicine

## 2015-05-30 ENCOUNTER — Encounter: Payer: Self-pay | Admitting: Internal Medicine

## 2015-05-30 ENCOUNTER — Ambulatory Visit (INDEPENDENT_AMBULATORY_CARE_PROVIDER_SITE_OTHER): Payer: Medicare Other | Admitting: Internal Medicine

## 2015-05-30 VITALS — BP 130/80 | HR 79 | Temp 98.5°F | Resp 20 | Ht 68.0 in | Wt 197.0 lb

## 2015-05-30 DIAGNOSIS — I10 Essential (primary) hypertension: Secondary | ICD-10-CM | POA: Diagnosis not present

## 2015-05-30 DIAGNOSIS — E7800 Pure hypercholesterolemia, unspecified: Secondary | ICD-10-CM

## 2015-05-30 DIAGNOSIS — E119 Type 2 diabetes mellitus without complications: Secondary | ICD-10-CM

## 2015-05-30 NOTE — Progress Notes (Signed)
Pre visit review using our clinic review tool, if applicable. No additional management support is needed unless otherwise documented below in the visit note. 

## 2015-05-30 NOTE — Patient Instructions (Signed)
Limit your sodium (Salt) intake   Please check your hemoglobin A1c every 3 months  Please check your blood pressure on a regular basis.  If it is consistently greater than 150/90, please make an office appointment.   

## 2015-05-30 NOTE — Progress Notes (Signed)
Subjective:    Patient ID: Anthony [name], male    DOB: September 24, 1961, 54 y.o.   MRN: [id number]  HPI  54 year old patient who is seen today for follow-up.  He has hypertension, dyslipidemia and type 2 diabetes.  No concerns or complaints.  He does monitor blood pressures and blood sugars at home.  He did have a ED visit a few months ago due to a toe paronychia.  He has a history colonic polyps and has had follow-up colonoscopy in December 2015 Feels well today Eye  examination.  Last week  Past Medical History  Diagnosis Date  . Anxiety   . GERD (gastroesophageal reflux disease)   . Hyperlipidemia   . Diabetes mellitus   . ADD (attention deficit disorder with hyperactivity)   . Hx of colonic polyp - sessile serrated polyp 12/07/2014    History   Social History  . Marital Status: Single    Spouse Name: N/A  . Number of Children: N/A  . Years of Education: N/A   Occupational History  . Not on file.   Social History Main Topics  . Smoking status: Former Smoker    Quit date: 08/24/1996  . Smokeless tobacco: Never Used  . Alcohol Use: 0.0 oz/week    0 Standard drinks or equivalent per week     Comment: 3-40 oz a day of beer   . Drug Use: No  . Sexual Activity: Not on file   Other Topics Concern  . Not on file   Social History Narrative    Past Surgical History  Procedure Laterality Date  . Pilonidal cyst excision      Family History  Problem Relation Age of Onset  . Brain cancer Mother   . Diabetes Father   . Diabetes Sister   . Colon cancer Neg Hx   . Esophageal cancer Neg Hx   . Rectal cancer Neg Hx   . Stomach cancer Neg Hx   . Stroke Brother   . Seizures Brother     No Known Allergies  Current Outpatient Prescriptions on File Prior to Visit  Medication Sig Dispense Refill  . aspirin 81 MG tablet Take 81 mg by mouth daily.      Marland Kitchen atorvastatin (LIPITOR) 40 MG tablet TAKE ONE TABLET ONCE DAILY 90 tablet 1  . glipiZIDE (GLUCOTROL XL) 5 MG 24 hr tablet  TAKE ONE TABLET ONCE DAILY 90 tablet 1  . glucose blood (ACCU-CHEK COMPACT STRIPS) test strip 1 each by Other route daily. 100 each 5  . hydrochlorothiazide (HYDRODIURIL) 12.5 MG tablet Take 1 tablet (12.5 mg total) by mouth daily. 90 tablet 3  . labetalol (NORMODYNE) 300 MG tablet Take 1 tablet (300 mg total) by mouth 2 (two) times daily. 180 tablet 5  . Lancet Devices (ACCU-CHEK SOFTCLIX) lancets Use as instructed 1 each 5  . lisinopril (PRINIVIL,ZESTRIL) 40 MG tablet TAKE ONE (1) TABLET EACH DAY 90 tablet 1  . metFORMIN (GLUCOPHAGE) 1000 MG tablet TAKE ONE TABLET TWICE DAILY WITH MEALS 180 tablet 1   No current facility-administered medications on file prior to visit.    BP 140/80 mmHg  Pulse 79  Temp(Src) 98.5 F (36.9 C) (Oral)  Resp 20  Ht 5\' 8"  (1.727 m)  Wt 197 lb (89.359 kg)  BMI 29.96 kg/m2  SpO2 97%    Review of Systems  Constitutional: Negative for fever, chills, appetite change and fatigue.  HENT: Negative for congestion, dental problem, ear pain, hearing loss, sore throat, tinnitus,  trouble swallowing and voice change.   Eyes: Negative for pain, discharge and visual disturbance.  Respiratory: Negative for cough, chest tightness, wheezing and stridor.   Cardiovascular: Negative for chest pain, palpitations and leg swelling.  Gastrointestinal: Negative for nausea, vomiting, abdominal pain, diarrhea, constipation, blood in stool and abdominal distention.  Genitourinary: Negative for urgency, hematuria, flank pain, discharge, difficulty urinating and genital sores.  Musculoskeletal: Negative for myalgias, back pain, joint swelling, arthralgias, gait problem and neck stiffness.  Skin: Negative for rash.  Neurological: Negative for dizziness, syncope, speech difficulty, weakness, numbness and headaches.  Hematological: Negative for adenopathy. Does not bruise/bleed easily.  Psychiatric/Behavioral: Negative for behavioral problems and dysphoric mood. The patient is not  nervous/anxious.        Objective:   Physical Exam  Constitutional: He is oriented to person, place, and time. He appears well-developed.  HENT:  Head: Normocephalic.  Right Ear: External ear normal.  Left Ear: External ear normal.  Eyes: Conjunctivae and EOM are normal.  Neck: Normal range of motion.  Cardiovascular: Normal rate, normal heart sounds and intact distal pulses.   Pulmonary/Chest: Breath sounds normal.  Abdominal: Bowel sounds are normal.  Musculoskeletal: Normal range of motion. He exhibits no edema or tenderness.  Neurological: He is alert and oriented to person, place, and time.  Psychiatric: He has a normal mood and affect. His behavior is normal.          Assessment & Plan:   Diabetes mellitus.  Will check a hemoglobin A1c.  No change in medication Hypertension, fair control.  Repeat blood pressure 130/80 Dyslipidemia.  Continue statin therapy  CPX 5 months

## 2015-07-26 ENCOUNTER — Other Ambulatory Visit: Payer: Self-pay | Admitting: Internal Medicine

## 2015-07-26 MED ORDER — GLUCOSE BLOOD VI STRP: 1.0000 | ORAL_STRIP | Freq: Every day | Status: DC

## 2015-07-26 NOTE — Telephone Encounter (Signed)
Pt needs refills on accu-chek test strips and lancet send to [facility] in [place]

## 2015-08-07 ENCOUNTER — Other Ambulatory Visit: Payer: Self-pay | Admitting: Internal Medicine

## 2015-08-07 ENCOUNTER — Telehealth: Payer: Self-pay | Admitting: Internal Medicine

## 2015-08-07 DIAGNOSIS — I10 Essential (primary) hypertension: Secondary | ICD-10-CM

## 2015-08-07 NOTE — Telephone Encounter (Signed)
Pt request refill of the following: metFORMIN (GLUCOPHAGE) 1000 MG tablet , labetalol (NORMODYNE) 300 MG tablet,lisinopril (PRINIVIL,ZESTRIL) 40 MG tablet,  glipiZIDE (GLUCOTROL XL) 5 MG 24 hr tablet , atorvastatin (LIPITOR) 40 MG tablet , hydrochlorothiazide (HYDRODIURIL) 12.5 MG tablet   Phamacy:  [place]

## 2015-08-08 MED ORDER — LISINOPRIL 40 MG PO TABS: ORAL_TABLET | ORAL | Status: DC

## 2015-08-08 MED ORDER — HYDROCHLOROTHIAZIDE 12.5 MG PO TABS: 12.5000 mg | ORAL_TABLET | Freq: Every day | ORAL | Status: DC

## 2015-08-08 MED ORDER — LABETALOL HCL 300 MG PO TABS: 300.0000 mg | ORAL_TABLET | Freq: Two times a day (BID) | ORAL | Status: DC

## 2015-08-08 MED ORDER — GLIPIZIDE ER 5 MG PO TB24: 5.0000 mg | ORAL_TABLET | Freq: Every day | ORAL | Status: DC

## 2015-08-08 MED ORDER — METFORMIN HCL 1000 MG PO TABS: ORAL_TABLET | ORAL | Status: DC

## 2015-08-08 MED ORDER — ATORVASTATIN CALCIUM 40 MG PO TABS: 40.0000 mg | ORAL_TABLET | Freq: Every day | ORAL | Status: DC

## 2015-08-08 NOTE — Telephone Encounter (Signed)
Rx's sent to [facility].

## 2015-09-25 ENCOUNTER — Encounter: Payer: Self-pay | Admitting: Internal Medicine

## 2015-09-25 ENCOUNTER — Ambulatory Visit (INDEPENDENT_AMBULATORY_CARE_PROVIDER_SITE_OTHER): Payer: Medicare Other | Admitting: Internal Medicine

## 2015-09-25 VITALS — BP 160/90 | HR 84 | Temp 98.7°F | Resp 20 | Ht 68.0 in | Wt 199.0 lb

## 2015-09-25 DIAGNOSIS — E119 Type 2 diabetes mellitus without complications: Secondary | ICD-10-CM | POA: Diagnosis not present

## 2015-09-25 DIAGNOSIS — E7800 Pure hypercholesterolemia, unspecified: Secondary | ICD-10-CM

## 2015-09-25 DIAGNOSIS — Z125 Encounter for screening for malignant neoplasm of prostate: Secondary | ICD-10-CM

## 2015-09-25 DIAGNOSIS — Z23 Encounter for immunization: Secondary | ICD-10-CM | POA: Diagnosis not present

## 2015-09-25 DIAGNOSIS — I10 Essential (primary) hypertension: Secondary | ICD-10-CM

## 2015-09-25 DIAGNOSIS — Z0000 Encounter for general adult medical examination without abnormal findings: Secondary | ICD-10-CM | POA: Diagnosis not present

## 2015-09-25 DIAGNOSIS — E118 Type 2 diabetes mellitus with unspecified complications: Secondary | ICD-10-CM

## 2015-09-25 DIAGNOSIS — Z86010 Personal history of colonic polyps: Secondary | ICD-10-CM

## 2015-09-25 HISTORY — DX: Type 2 diabetes mellitus with unspecified complications: E11.8

## 2015-09-25 LAB — CBC WITH DIFFERENTIAL/PLATELET
Basophils Absolute: 0.1 10*3/uL (ref 0.0–0.1)
Basophils Relative: 0.9 % (ref 0.0–3.0)
EOS PCT: 1.1 % (ref 0.0–5.0)
Eosinophils Absolute: 0.1 10*3/uL (ref 0.0–0.7)
HEMATOCRIT: 39 % (ref 39.0–52.0)
HEMOGLOBIN: 13 g/dL (ref 13.0–17.0)
LYMPHS PCT: 23.4 % (ref 12.0–46.0)
Lymphs Abs: 1.4 10*3/uL (ref 0.7–4.0)
MCHC: 33.4 g/dL (ref 30.0–36.0)
MCV: 93.1 fl (ref 78.0–100.0)
Monocytes Absolute: 0.4 10*3/uL (ref 0.1–1.0)
Monocytes Relative: 6.4 % (ref 3.0–12.0)
Neutro Abs: 4.1 10*3/uL (ref 1.4–7.7)
Neutrophils Relative %: 68.2 % (ref 43.0–77.0)
Platelets: 211 10*3/uL (ref 150.0–400.0)
RBC: 4.18 Mil/uL — AB (ref 4.22–5.81)
RDW: 12.6 % (ref 11.5–15.5)
WBC: 6 10*3/uL (ref 4.0–10.5)

## 2015-09-25 LAB — COMPREHENSIVE METABOLIC PANEL
ALBUMIN: 4.4 g/dL (ref 3.5–5.2)
ALT: 56 U/L — ABNORMAL HIGH (ref 0–53)
AST: 58 U/L — ABNORMAL HIGH (ref 0–37)
Alkaline Phosphatase: 65 U/L (ref 39–117)
BUN: 15 mg/dL (ref 6–23)
CO2: 27 mEq/L (ref 19–32)
Calcium: 9.3 mg/dL (ref 8.4–10.5)
Chloride: 101 mEq/L (ref 96–112)
Creatinine, Ser: 0.93 mg/dL (ref 0.40–1.50)
GFR: 90.3 mL/min (ref >60.00)
GLUCOSE: 123 mg/dL — AB (ref 70–99)
Potassium: 4.7 mEq/L (ref 3.5–5.1)
Sodium: 139 mEq/L (ref 135–145)
Total Bilirubin: 0.7 mg/dL (ref 0.2–1.2)
Total Protein: 7.1 g/dL (ref 6.0–8.3)

## 2015-09-25 LAB — LIPID PANEL
Cholesterol: 182 mg/dL (ref 0–200)
HDL: 74.7 mg/dL (ref >39.00)
LDL Cholesterol: 71 mg/dL (ref 0–99)
NONHDL: 107.72
Total CHOL/HDL Ratio: 2
Triglycerides: 185 mg/dL — ABNORMAL HIGH (ref 0.0–149.0)
VLDL: 37 mg/dL (ref 0.0–40.0)

## 2015-09-25 LAB — MICROALBUMIN / CREATININE URINE RATIO
Creatinine,U: 220 mg/dL
Microalb Creat Ratio: 1.6 mg/g (ref 0.0–30.0)
Microalb, Ur: 3.6 mg/dL — ABNORMAL HIGH (ref 0.0–1.9)

## 2015-09-25 LAB — PSA: PSA: 0.58 ng/mL (ref 0.10–4.00)

## 2015-09-25 LAB — HEMOGLOBIN A1C: Hgb A1c MFr Bld: 6.2 % (ref 4.6–6.5)

## 2015-09-25 LAB — TSH: TSH: 0.56 u[IU]/mL (ref 0.35–4.50)

## 2015-09-25 NOTE — Progress Notes (Signed)
Pre visit review using our clinic review tool, if applicable. No additional management support is needed unless otherwise documented below in the visit note. 

## 2015-09-25 NOTE — Patient Instructions (Addendum)
Limit your sodium (Salt) intake    It is important that you exercise regularly, at least 20 minutes 3 to 4 times per week.  If you develop chest pain or shortness of breath seek  medical attention.  You need to lose weight.  Consider a lower calorie diet and regular exercise.   Please check your hemoglobin A1c every 6  Months  Please check your blood pressure on a regular basis.  If it is consistently greater than 150/90, please make an office appointment.   Health Maintenance, Male A healthy lifestyle and preventative care can promote health and wellness.  Maintain regular health, dental, and eye exams.  Eat a healthy diet. Foods like vegetables, fruits, whole grains, low-fat dairy products, and lean protein foods contain the nutrients you need and are low in calories. Decrease your intake of foods high in solid fats, added sugars, and salt. Get information about a proper diet from your health care provider, if necessary.  Regular physical exercise is one of the most important things you can do for your health. Most adults should get at least 150 minutes of moderate-intensity exercise (any activity that increases your heart rate and causes you to sweat) each week. In addition, most adults need muscle-strengthening exercises on 2 or more days a week.   Maintain a healthy weight. The body mass index (BMI) is a screening tool to identify possible weight problems. It provides an estimate of body fat based on height and weight. Your health care provider can find your BMI and can help you achieve or maintain a healthy weight. For males 20 years and older:  A BMI below 18.5 is considered underweight.  A BMI of 18.5 to 24.9 is normal.  A BMI of 25 to 29.9 is considered overweight.  A BMI of 30 and above is considered obese.  Maintain normal blood lipids and cholesterol by exercising and minimizing your intake of saturated fat. Eat a balanced diet with plenty of fruits and vegetables. Blood  tests for lipids and cholesterol should begin at age 26 and be repeated every 5 years. If your lipid or cholesterol levels are high, you are over age 12, or you are at high risk for heart disease, you may need your cholesterol levels checked more frequently.Ongoing high lipid and cholesterol levels should be treated with medicines if diet and exercise are not working.  If you smoke, find out from your health care provider how to quit. If you do not use tobacco, do not start.  Lung cancer screening is recommended for adults aged 72-80 years who are at high risk for developing lung cancer because of a history of smoking. A yearly low-dose CT scan of the lungs is recommended for people who have at least a 30-pack-year history of smoking and are current smokers or have quit within the past 15 years. A pack year of smoking is smoking an average of 1 pack of cigarettes a day for 1 year (for example, a 30-pack-year history of smoking could mean smoking 1 pack a day for 30 years or 2 packs a day for 15 years). Yearly screening should continue until the smoker has stopped smoking for at least 15 years. Yearly screening should be stopped for people who develop a health problem that would prevent them from having lung cancer treatment.  If you choose to drink alcohol, do not have more than 2 drinks per day. One drink is considered to be 12 oz (360 mL) of beer, 5 oz (150  mL) of wine, or 1.5 oz (45 mL) of liquor.  Avoid the use of street drugs. Do not share needles with anyone. Ask for help if you need support or instructions about stopping the use of drugs.  High blood pressure causes heart disease and increases the risk of stroke. High blood pressure is more likely to develop in:  People who have blood pressure in the end of the normal range (100-139/85-89 mm Hg).  People who are overweight or obese.  People who are African American.  If you are 66-68 years of age, have your blood pressure checked every 3-5  years. If you are 12 years of age or older, have your blood pressure checked every year. You should have your blood pressure measured twice--once when you are at a hospital or clinic, and once when you are not at a hospital or clinic. Record the average of the two measurements. To check your blood pressure when you are not at a hospital or clinic, you can use:  An automated blood pressure machine at a pharmacy.  A home blood pressure monitor.  If you are 12-42 years old, ask your health care provider if you should take aspirin to prevent heart disease.  Diabetes screening involves taking a blood sample to check your fasting blood sugar level. This should be done once every 3 years after age 58 if you are at a normal weight and without risk factors for diabetes. Testing should be considered at a younger age or be carried out more frequently if you are overweight and have at least 1 risk factor for diabetes.  Colorectal cancer can be detected and often prevented. Most routine colorectal cancer screening begins at the age of 78 and continues through age 43. However, your health care provider may recommend screening at an earlier age if you have risk factors for colon cancer. On a yearly basis, your health care provider may provide home test kits to check for hidden blood in the stool. A small camera at the end of a tube may be used to directly examine the colon (sigmoidoscopy or colonoscopy) to detect the earliest forms of colorectal cancer. Talk to your health care provider about this at age 34 when routine screening begins. A direct exam of the colon should be repeated every 5-10 years through age 68, unless early forms of precancerous polyps or small growths are found.  People who are at an increased risk for hepatitis B should be screened for this virus. You are considered at high risk for hepatitis B if:  You were born in a country where hepatitis B occurs often. Talk with your health care provider  about which countries are considered high risk.  Your parents were born in a high-risk country and you have not received a shot to protect against hepatitis B (hepatitis B vaccine).  You have HIV or AIDS.  You use needles to inject street drugs.  You live with, or have sex with, someone who has hepatitis B.  You are a man who has sex with other men (MSM).  You get hemodialysis treatment.  You take certain medicines for conditions like cancer, organ transplantation, and autoimmune conditions.  Hepatitis C blood testing is recommended for all people born from [birth year] through 1964 and any individual with known risk factors for hepatitis C.  Healthy men should no longer receive prostate-specific antigen (PSA) blood tests as part of routine cancer screening. Talk to your health care provider about prostate cancer screening.  Testicular  cancer screening is not recommended for adolescents or adult males who have no symptoms. Screening includes self-exam, a health care provider exam, and other screening tests. Consult with your health care provider about any symptoms you have or any concerns you have about testicular cancer.  Practice safe sex. Use condoms and avoid high-risk sexual practices to reduce the spread of sexually transmitted infections (STIs).  You should be screened for STIs, including gonorrhea and chlamydia if:  You are sexually active and are younger than 24 years.  You are older than 24 years, and your health care provider tells you that you are at risk for this type of infection.  Your sexual activity has changed since you were last screened, and you are at an increased risk for chlamydia or gonorrhea. Ask your health care provider if you are at risk.  If you are at risk of being infected with HIV, it is recommended that you take a prescription medicine daily to prevent HIV infection. This is called pre-exposure prophylaxis (PrEP). You are considered at risk if:  You are a  man who has sex with other men (MSM).  You are a heterosexual man who is sexually active with multiple partners.  You take drugs by injection.  You are sexually active with a partner who has HIV.  Talk with your health care provider about whether you are at high risk of being infected with HIV. If you choose to begin PrEP, you should first be tested for HIV. You should then be tested every 3 months for as long as you are taking PrEP.  Use sunscreen. Apply sunscreen liberally and repeatedly throughout the day. You should seek shade when your shadow is shorter than you. Protect yourself by wearing long sleeves, pants, a wide-brimmed hat, and sunglasses year round whenever you are outdoors.  Tell your health care provider of new moles or changes in moles, especially if there is a change in shape or color. Also, tell your health care provider if a mole is larger than the size of a pencil eraser.  A one-time screening for abdominal aortic aneurysm (AAA) and surgical repair of large AAAs by ultrasound is recommended for men aged 67-75 years who are current or former smokers.  Stay current with your vaccines (immunizations).   This information is not intended to replace advice given to you by your health care provider. Make sure you discuss any questions you have with your health care provider.   Document Released: 06/04/2008 Document Revised: 12/28/2014 Document Reviewed: 05/04/2011 Elsevier Interactive Patient Education [employer].

## 2015-09-25 NOTE — Progress Notes (Signed)
Subjective:    Patient ID: Anthony [name], male    DOB: Mar 26, 1961, 54 y.o.   MRN: [id number]  HPI   54 -year-old patient who is seen today for a preventive health examination.   He has type 2 diabetes, which has been well-controlled on oral therapy. He has hypertension and Hydrochlorothiazide was added to his regimen.  He generally feels well He does have annual examinations He is scheduled for follow-up colonoscopy in 2019 He has dyslipidemia and is maintained on atorvastatin, which he continues to tolerate well. He has exogenous obesity, which has improved modestly  Lab Results  Component Value Date   HGBA1C 6.4 09/24/2014    BP Readings from Last 3 Encounters:  09/25/15 160/90  05/30/15 130/80  02/18/15 136/81    Wt Readings from Last 3 Encounters:  09/25/15 199 lb (90.266 kg)  05/30/15 197 lb (89.359 kg)  02/18/15 200 lb (90.719 kg)     Past Medical History  Diagnosis Date  . Anxiety   . GERD (gastroesophageal reflux disease)   . Hyperlipidemia   . Diabetes mellitus   . ADD (attention deficit disorder with hyperactivity)   . Hx of colonic polyp - sessile serrated polyp 12/07/2014    Social History   Social History  . Marital Status: Single    Spouse Name: N/A  . Number of Children: N/A  . Years of Education: N/A   Occupational History  . Not on file.   Social History Main Topics  . Smoking status: Former Smoker    Quit date: 08/24/1996  . Smokeless tobacco: Never Used  . Alcohol Use: 0.0 oz/week    0 Standard drinks or equivalent per week     Comment: 3-40 oz a day of beer   . Drug Use: No  . Sexual Activity: Not on file   Other Topics Concern  . Not on file   Social History Narrative    Past Surgical History  Procedure Laterality Date  . Pilonidal cyst excision      Family History  Problem Relation Age of Onset  . Brain cancer Mother   . Diabetes Father   . Diabetes Sister   . Colon cancer Neg Hx   . Esophageal cancer Neg Hx     . Rectal cancer Neg Hx   . Stomach cancer Neg Hx   . Stroke Brother   . Seizures Brother     No Known Allergies  Current Outpatient Prescriptions on File Prior to Visit  Medication Sig Dispense Refill  . ACCU-CHEK AVIVA PLUS test strip USE TO TEST BLOOD SUGAR DAILY. 100 each 6  . ACCU-CHEK SOFTCLIX LANCETS lancets USE TO TEST BLOOD SUGAR DAILY. 100 each 6  . aspirin 81 MG tablet Take 81 mg by mouth daily.      Marland Kitchen atorvastatin (LIPITOR) 40 MG tablet Take 1 tablet (40 mg total) by mouth daily. 90 tablet 1  . glipiZIDE (GLUCOTROL XL) 5 MG 24 hr tablet Take 1 tablet (5 mg total) by mouth daily. 90 tablet 1  . hydrochlorothiazide (HYDRODIURIL) 12.5 MG tablet Take 1 tablet (12.5 mg total) by mouth daily. 90 tablet 3  . labetalol (NORMODYNE) 300 MG tablet Take 1 tablet (300 mg total) by mouth 2 (two) times daily. 180 tablet 5  . lisinopril (PRINIVIL,ZESTRIL) 40 MG tablet TAKE ONE (1) TABLET EACH DAY 90 tablet 1  . metFORMIN (GLUCOPHAGE) 1000 MG tablet TAKE ONE TABLET TWICE DAILY WITH MEALS 180 tablet 1   No current facility-administered  medications on file prior to visit.    BP 160/90 mmHg  Pulse 84  Temp(Src) 98.7 F (37.1 C) (Oral)  Resp 20  Ht 5\' 8"  (1.727 m)  Wt 199 lb (90.266 kg)  BMI 30.26 kg/m2  SpO2 96%     Review of Systems  Constitutional: Negative for fever, chills, activity change, appetite change and fatigue.  HENT: Negative for congestion, dental problem, ear pain, hearing loss, mouth sores, rhinorrhea, sinus pressure, sneezing, tinnitus, trouble swallowing and voice change.   Eyes: Negative for photophobia, pain, redness and visual disturbance.  Respiratory: Negative for apnea, cough, choking, chest tightness, shortness of breath and wheezing.   Cardiovascular: Negative for chest pain, palpitations and leg swelling.  Gastrointestinal: Negative for nausea, vomiting, abdominal pain, diarrhea, constipation, blood in stool, abdominal distention, anal bleeding and rectal  pain.  Genitourinary: Negative for dysuria, urgency, frequency, hematuria, flank pain, decreased urine volume, discharge, penile swelling, scrotal swelling, difficulty urinating, genital sores and testicular pain.  Musculoskeletal: Negative for myalgias, back pain, joint swelling, arthralgias, gait problem, neck pain and neck stiffness.  Skin: Negative for color change, rash and wound.  Neurological: Negative for dizziness, tremors, seizures, syncope, facial asymmetry, speech difficulty, weakness, light-headedness, numbness and headaches.  Hematological: Negative for adenopathy. Does not bruise/bleed easily.  Psychiatric/Behavioral: Negative for suicidal ideas, hallucinations, behavioral problems, confusion, sleep disturbance, self-injury, dysphoric mood, decreased concentration and agitation. The patient is not nervous/anxious.        Objective:   Physical Exam  Constitutional: He appears well-developed and well-nourished.  HENT:  Head: Normocephalic and atraumatic.  Right Ear: External ear normal.  Left Ear: External ear normal.  Nose: Nose normal.  Mouth/Throat: Oropharynx is clear and moist.  Low hanging soft palate  Eyes: Conjunctivae and EOM are normal. Pupils are equal, round, and reactive to light. No scleral icterus.  Neck: Normal range of motion. Neck supple. No JVD present. No thyromegaly present.  Cardiovascular: Regular rhythm, normal heart sounds and intact distal pulses.  Exam reveals no gallop and no friction rub.   No murmur heard. Pulmonary/Chest: Effort normal and breath sounds normal. He exhibits no tenderness.  Abdominal: Soft. Bowel sounds are normal. He exhibits no distension and no mass. There is no tenderness.  Genitourinary: Prostate normal and penis normal. Guaiac negative stool.  Musculoskeletal: Normal range of motion. He exhibits no edema or tenderness.  Lymphadenopathy:    He has no cervical adenopathy.  Neurological: He is alert. He has normal reflexes.  No cranial nerve deficit. Coordination normal.  Skin: Skin is warm and dry. No rash noted.  Psychiatric: He has a normal mood and affect. His behavior is normal.          Assessment & Plan:   Preventive health examination Hypertension, Dyslipidemia.  Continue atorvastatin Diabetes mellitus.  Appears to be well controlled.  We'll check a hemoglobin A1c and urine for microalbumin Obesity  Followup colonoscopy 2019

## 2015-11-22 ENCOUNTER — Telehealth: Payer: Self-pay | Admitting: Internal Medicine

## 2015-11-22 MED ORDER — FLUTICASONE PROPIONATE 50 MCG/ACT NA SUSP: 2.0000 | Freq: Every day | NASAL | Status: DC

## 2015-11-22 NOTE — Telephone Encounter (Signed)
[place] call to ask for rx for Flonase . Pharmacy said it had been a while since rx was filled and that was by Dr Judd Gaudier.

## 2015-11-22 NOTE — Telephone Encounter (Signed)
Rx sent to pharmacy   

## 2015-11-22 NOTE — Telephone Encounter (Signed)
ok 

## 2015-11-22 NOTE — Telephone Encounter (Signed)
Okay to fill Flonase Rx?

## 2015-12-15 ENCOUNTER — Encounter (HOSPITAL_COMMUNITY): Payer: Self-pay | Admitting: *Deleted

## 2015-12-15 ENCOUNTER — Inpatient Hospital Stay (HOSPITAL_COMMUNITY)
Admission: EM | Admit: 2015-12-15 | Discharge: 2015-12-18 | DRG: 603 | Disposition: A | Discharge status: Home / Self Care | Payer: Medicare Other | Attending: Internal Medicine | Admitting: Internal Medicine

## 2015-12-15 DIAGNOSIS — K219 Gastro-esophageal reflux disease without esophagitis: Secondary | ICD-10-CM | POA: Diagnosis not present

## 2015-12-15 DIAGNOSIS — Z833 Family history of diabetes mellitus: Secondary | ICD-10-CM

## 2015-12-15 DIAGNOSIS — F10239 Alcohol dependence with withdrawal, unspecified: Secondary | ICD-10-CM | POA: Diagnosis not present

## 2015-12-15 DIAGNOSIS — E119 Type 2 diabetes mellitus without complications: Secondary | ICD-10-CM | POA: Diagnosis present

## 2015-12-15 DIAGNOSIS — E785 Hyperlipidemia, unspecified: Secondary | ICD-10-CM | POA: Diagnosis not present

## 2015-12-15 DIAGNOSIS — F901 Attention-deficit hyperactivity disorder, predominantly hyperactive type: Secondary | ICD-10-CM | POA: Diagnosis not present

## 2015-12-15 DIAGNOSIS — M7989 Other specified soft tissue disorders: Secondary | ICD-10-CM | POA: Diagnosis not present

## 2015-12-15 DIAGNOSIS — S91339A Puncture wound without foreign body, unspecified foot, initial encounter: Secondary | ICD-10-CM

## 2015-12-15 DIAGNOSIS — Z7984 Long term (current) use of oral hypoglycemic drugs: Secondary | ICD-10-CM

## 2015-12-15 DIAGNOSIS — L02612 Cutaneous abscess of left foot: Secondary | ICD-10-CM | POA: Diagnosis not present

## 2015-12-15 DIAGNOSIS — Z7982 Long term (current) use of aspirin: Secondary | ICD-10-CM | POA: Diagnosis not present

## 2015-12-15 DIAGNOSIS — L02619 Cutaneous abscess of unspecified foot: Secondary | ICD-10-CM | POA: Diagnosis present

## 2015-12-15 DIAGNOSIS — Z808 Family history of malignant neoplasm of other organs or systems: Secondary | ICD-10-CM

## 2015-12-15 DIAGNOSIS — S91332D Puncture wound without foreign body, left foot, subsequent encounter: Secondary | ICD-10-CM | POA: Diagnosis not present

## 2015-12-15 DIAGNOSIS — Z823 Family history of stroke: Secondary | ICD-10-CM | POA: Diagnosis not present

## 2015-12-15 DIAGNOSIS — S91332A Puncture wound without foreign body, left foot, initial encounter: Secondary | ICD-10-CM | POA: Diagnosis not present

## 2015-12-15 DIAGNOSIS — L03119 Cellulitis of unspecified part of limb: Secondary | ICD-10-CM | POA: Diagnosis not present

## 2015-12-15 DIAGNOSIS — L03116 Cellulitis of left lower limb: Secondary | ICD-10-CM | POA: Diagnosis not present

## 2015-12-15 DIAGNOSIS — I10 Essential (primary) hypertension: Secondary | ICD-10-CM | POA: Diagnosis not present

## 2015-12-15 DIAGNOSIS — W2209XA Striking against other stationary object, initial encounter: Secondary | ICD-10-CM | POA: Diagnosis present

## 2015-12-15 DIAGNOSIS — Z87891 Personal history of nicotine dependence: Secondary | ICD-10-CM | POA: Diagnosis not present

## 2015-12-15 DIAGNOSIS — E118 Type 2 diabetes mellitus with unspecified complications: Secondary | ICD-10-CM

## 2015-12-15 NOTE — ED Notes (Signed)
Pt c/o pain and swelling to left foot; pt states he stepped on something x 2 days ago and has had swelling since then; pt's foot is red and swollen with the redness going to the ankle

## 2015-12-15 NOTE — ED Notes (Signed)
Pt's left foot swollen and reddened.

## 2015-12-16 ENCOUNTER — Emergency Department (HOSPITAL_COMMUNITY): Payer: Medicare Other

## 2015-12-16 DIAGNOSIS — W2209XA Striking against other stationary object, initial encounter: Secondary | ICD-10-CM | POA: Diagnosis not present

## 2015-12-16 DIAGNOSIS — E119 Type 2 diabetes mellitus without complications: Secondary | ICD-10-CM

## 2015-12-16 DIAGNOSIS — L02619 Cutaneous abscess of unspecified foot: Secondary | ICD-10-CM

## 2015-12-16 DIAGNOSIS — Z833 Family history of diabetes mellitus: Secondary | ICD-10-CM | POA: Diagnosis not present

## 2015-12-16 DIAGNOSIS — L03119 Cellulitis of unspecified part of limb: Secondary | ICD-10-CM | POA: Diagnosis not present

## 2015-12-16 DIAGNOSIS — Z808 Family history of malignant neoplasm of other organs or systems: Secondary | ICD-10-CM | POA: Diagnosis not present

## 2015-12-16 DIAGNOSIS — Z7984 Long term (current) use of oral hypoglycemic drugs: Secondary | ICD-10-CM | POA: Diagnosis not present

## 2015-12-16 DIAGNOSIS — I10 Essential (primary) hypertension: Secondary | ICD-10-CM | POA: Diagnosis not present

## 2015-12-16 DIAGNOSIS — F10239 Alcohol dependence with withdrawal, unspecified: Secondary | ICD-10-CM | POA: Diagnosis not present

## 2015-12-16 DIAGNOSIS — L03116 Cellulitis of left lower limb: Secondary | ICD-10-CM | POA: Diagnosis not present

## 2015-12-16 DIAGNOSIS — S91332D Puncture wound without foreign body, left foot, subsequent encounter: Secondary | ICD-10-CM | POA: Diagnosis not present

## 2015-12-16 DIAGNOSIS — M7989 Other specified soft tissue disorders: Secondary | ICD-10-CM | POA: Diagnosis not present

## 2015-12-16 DIAGNOSIS — Z87891 Personal history of nicotine dependence: Secondary | ICD-10-CM | POA: Diagnosis not present

## 2015-12-16 DIAGNOSIS — F901 Attention-deficit hyperactivity disorder, predominantly hyperactive type: Secondary | ICD-10-CM | POA: Diagnosis not present

## 2015-12-16 DIAGNOSIS — Z823 Family history of stroke: Secondary | ICD-10-CM | POA: Diagnosis not present

## 2015-12-16 DIAGNOSIS — K219 Gastro-esophageal reflux disease without esophagitis: Secondary | ICD-10-CM | POA: Diagnosis not present

## 2015-12-16 DIAGNOSIS — S91332A Puncture wound without foreign body, left foot, initial encounter: Secondary | ICD-10-CM | POA: Diagnosis not present

## 2015-12-16 DIAGNOSIS — L02612 Cutaneous abscess of left foot: Secondary | ICD-10-CM | POA: Diagnosis not present

## 2015-12-16 DIAGNOSIS — S91339A Puncture wound without foreign body, unspecified foot, initial encounter: Secondary | ICD-10-CM

## 2015-12-16 DIAGNOSIS — Z7982 Long term (current) use of aspirin: Secondary | ICD-10-CM | POA: Diagnosis not present

## 2015-12-16 DIAGNOSIS — E785 Hyperlipidemia, unspecified: Secondary | ICD-10-CM | POA: Diagnosis not present

## 2015-12-16 LAB — BASIC METABOLIC PANEL
ANION GAP: 10 (ref 5–15)
Anion gap: 6 (ref 5–15)
BUN: 16 mg/dL (ref 6–20)
BUN: 18 mg/dL (ref 6–20)
CALCIUM: 8.6 mg/dL — AB (ref 8.9–10.3)
CHLORIDE: 104 mmol/L (ref 101–111)
CO2: 25 mmol/L (ref 22–32)
CO2: 22 mmol/L (ref 22–32)
CREATININE: 0.81 mg/dL (ref 0.61–1.24)
Calcium: 8.4 mg/dL — ABNORMAL LOW (ref 8.9–10.3)
Chloride: 104 mmol/L (ref 101–111)
Creatinine, Ser: 0.89 mg/dL (ref 0.61–1.24)
GFR calc Af Amer: >60 mL/min (ref >60)
GFR calc Af Amer: >60 mL/min (ref >60)
GFR calc non Af Amer: >60 mL/min (ref >60)
GFR calc non Af Amer: >60 mL/min (ref >60)
GLUCOSE: 131 mg/dL — AB (ref 65–99)
Glucose, Bld: 128 mg/dL — ABNORMAL HIGH (ref 65–99)
POTASSIUM: 4.1 mmol/L (ref 3.5–5.1)
Potassium: 4 mmol/L (ref 3.5–5.1)
Sodium: 135 mmol/L (ref 135–145)
Sodium: 136 mmol/L (ref 135–145)

## 2015-12-16 LAB — CBC WITH DIFFERENTIAL/PLATELET
Basophils Absolute: 0 10*3/uL (ref 0.0–0.1)
Basophils Relative: 0 %
EOS ABS: 0 10*3/uL (ref 0.0–0.7)
Eosinophils Relative: 0 %
HCT: 35.8 % — ABNORMAL LOW (ref 39.0–52.0)
HEMOGLOBIN: 12.3 g/dL — AB (ref 13.0–17.0)
LYMPHS ABS: 2.2 10*3/uL (ref 0.7–4.0)
Lymphocytes Relative: 21 %
MCH: 31.9 pg (ref 26.0–34.0)
MCHC: 34.4 g/dL (ref 30.0–36.0)
MCV: 92.7 fL (ref 78.0–100.0)
MONO ABS: 0.6 10*3/uL (ref 0.1–1.0)
MONOS PCT: 6 %
NEUTROS PCT: 73 %
Neutro Abs: 7.6 10*3/uL (ref 1.7–7.7)
Platelets: 195 10*3/uL (ref 150–400)
RBC: 3.86 MIL/uL — ABNORMAL LOW (ref 4.22–5.81)
RDW: 12.7 % (ref 11.5–15.5)
WBC: 10.4 10*3/uL (ref 4.0–10.5)

## 2015-12-16 LAB — CBC
HEMATOCRIT: 31.5 % — AB (ref 39.0–52.0)
HEMOGLOBIN: 10.8 g/dL — AB (ref 13.0–17.0)
MCH: 31.5 pg (ref 26.0–34.0)
MCHC: 34.3 g/dL (ref 30.0–36.0)
MCV: 91.8 fL (ref 78.0–100.0)
Platelets: 170 10*3/uL (ref 150–400)
RBC: 3.43 MIL/uL — ABNORMAL LOW (ref 4.22–5.81)
RDW: 12.8 % (ref 11.5–15.5)
WBC: 8.7 10*3/uL (ref 4.0–10.5)

## 2015-12-16 LAB — GLUCOSE, CAPILLARY
GLUCOSE-CAPILLARY: 148 mg/dL — AB (ref 65–99)
Glucose-Capillary: 105 mg/dL — ABNORMAL HIGH (ref 65–99)
Glucose-Capillary: 107 mg/dL — ABNORMAL HIGH (ref 65–99)
Glucose-Capillary: 108 mg/dL — ABNORMAL HIGH (ref 65–99)

## 2015-12-16 MED ORDER — KETOROLAC TROMETHAMINE 30 MG/ML IJ SOLN
30.0000 mg | Freq: Once | INTRAMUSCULAR | Status: AC
  Administered 2015-12-16: 30 mg via INTRAVENOUS
  Filled 2015-12-16: qty 1

## 2015-12-16 MED ORDER — ACETAMINOPHEN 650 MG RE SUPP: 650.0000 mg | Freq: Four times a day (QID) | RECTAL | Status: DC | PRN

## 2015-12-16 MED ORDER — ACETAMINOPHEN 325 MG PO TABS: 650.0000 mg | ORAL_TABLET | Freq: Four times a day (QID) | ORAL | Status: DC | PRN

## 2015-12-16 MED ORDER — CLINDAMYCIN PHOSPHATE 600 MG/50ML IV SOLN
600.0000 mg | Freq: Once | INTRAVENOUS | Status: AC
  Administered 2015-12-16: 600 mg via INTRAVENOUS
  Filled 2015-12-16: qty 50

## 2015-12-16 MED ORDER — TETANUS-DIPHTH-ACELL PERTUSSIS 5-2.5-18.5 LF-MCG/0.5 IM SUSP: 0.5000 mL | Freq: Once | INTRAMUSCULAR | Status: DC

## 2015-12-16 MED ORDER — LABETALOL HCL 200 MG PO TABS
300.0000 mg | ORAL_TABLET | Freq: Two times a day (BID) | ORAL | Status: DC
  Filled 2015-12-16: qty 2

## 2015-12-16 MED ORDER — CLINDAMYCIN PHOSPHATE 600 MG/50ML IV SOLN
600.0000 mg | Freq: Three times a day (TID) | INTRAVENOUS | Status: DC
  Administered 2015-12-16 – 2015-12-18 (×7): 600 mg via INTRAVENOUS
  Filled 2015-12-16 (×11): qty 50

## 2015-12-16 MED ORDER — ONDANSETRON HCL 4 MG/2ML IJ SOLN: 4.0000 mg | Freq: Four times a day (QID) | INTRAMUSCULAR | Status: DC | PRN

## 2015-12-16 MED ORDER — VANCOMYCIN HCL 10 G IV SOLR
1500.0000 mg | Freq: Once | INTRAVENOUS | Status: AC
  Administered 2015-12-16: 1500 mg via INTRAVENOUS
  Filled 2015-12-16: qty 1500

## 2015-12-16 MED ORDER — INSULIN ASPART 100 UNIT/ML ~~LOC~~ SOLN
0.0000 [IU] | Freq: Three times a day (TID) | SUBCUTANEOUS | Status: DC
  Administered 2015-12-16: 1 [IU] via SUBCUTANEOUS
  Administered 2015-12-17: 2 [IU] via SUBCUTANEOUS
  Administered 2015-12-17 (×2): 1 [IU] via SUBCUTANEOUS
  Administered 2015-12-18: 3 [IU] via SUBCUTANEOUS
  Administered 2015-12-18: 1 [IU] via SUBCUTANEOUS

## 2015-12-16 MED ORDER — ONDANSETRON HCL 4 MG PO TABS: 4.0000 mg | ORAL_TABLET | Freq: Four times a day (QID) | ORAL | Status: DC | PRN

## 2015-12-16 MED ORDER — LORAZEPAM 2 MG/ML IJ SOLN: 1.0000 mg | Freq: Four times a day (QID) | INTRAMUSCULAR | Status: DC | PRN

## 2015-12-16 MED ORDER — FOLIC ACID 1 MG PO TABS
1.0000 mg | ORAL_TABLET | Freq: Every day | ORAL | Status: DC
  Administered 2015-12-16 – 2015-12-18 (×3): 1 mg via ORAL
  Filled 2015-12-16 (×3): qty 1

## 2015-12-16 MED ORDER — TETANUS-DIPHTH-ACELL PERTUSSIS 5-2.5-18.5 LF-MCG/0.5 IM SUSP
0.5000 mL | Freq: Once | INTRAMUSCULAR | Status: AC
  Administered 2015-12-16: 0.5 mL via INTRAMUSCULAR

## 2015-12-16 MED ORDER — HYDROMORPHONE HCL 1 MG/ML IJ SOLN
0.5000 mg | INTRAMUSCULAR | Status: DC | PRN
  Administered 2015-12-16: 1 mg via INTRAVENOUS
  Filled 2015-12-16: qty 1

## 2015-12-16 MED ORDER — ASPIRIN EC 81 MG PO TBEC
81.0000 mg | DELAYED_RELEASE_TABLET | Freq: Every day | ORAL | Status: DC
  Administered 2015-12-16 – 2015-12-18 (×3): 81 mg via ORAL
  Filled 2015-12-16 (×3): qty 1

## 2015-12-16 MED ORDER — VITAMIN B-1 100 MG PO TABS
100.0000 mg | ORAL_TABLET | Freq: Every day | ORAL | Status: DC
  Administered 2015-12-16 – 2015-12-18 (×3): 100 mg via ORAL
  Filled 2015-12-16 (×3): qty 1

## 2015-12-16 MED ORDER — ALUM & MAG HYDROXIDE-SIMETH 200-200-20 MG/5ML PO SUSP: 30.0000 mL | Freq: Four times a day (QID) | ORAL | Status: DC | PRN

## 2015-12-16 MED ORDER — LISINOPRIL 10 MG PO TABS
40.0000 mg | ORAL_TABLET | Freq: Every day | ORAL | Status: DC
  Administered 2015-12-16 – 2015-12-18 (×3): 40 mg via ORAL
  Filled 2015-12-16 (×3): qty 4

## 2015-12-16 MED ORDER — ATORVASTATIN CALCIUM 40 MG PO TABS
40.0000 mg | ORAL_TABLET | Freq: Every day | ORAL | Status: DC
  Administered 2015-12-16 – 2015-12-18 (×3): 40 mg via ORAL
  Filled 2015-12-16: qty 1
  Filled 2015-12-16: qty 2
  Filled 2015-12-16: qty 1

## 2015-12-16 MED ORDER — SODIUM CHLORIDE 0.9 % IV SOLN
INTRAVENOUS | Status: AC
  Administered 2015-12-16: 04:00:00 via INTRAVENOUS

## 2015-12-16 MED ORDER — LABETALOL HCL 200 MG PO TABS
300.0000 mg | ORAL_TABLET | Freq: Two times a day (BID) | ORAL | Status: DC
  Administered 2015-12-16 – 2015-12-18 (×5): 300 mg via ORAL
  Filled 2015-12-16 (×5): qty 2

## 2015-12-16 MED ORDER — LORAZEPAM 1 MG PO TABS: 1.0000 mg | ORAL_TABLET | Freq: Four times a day (QID) | ORAL | Status: DC | PRN

## 2015-12-16 MED ORDER — TETANUS-DIPHTH-ACELL PERTUSSIS 5-2.5-18.5 LF-MCG/0.5 IM SUSP
INTRAMUSCULAR | Status: AC
  Filled 2015-12-16: qty 0.5

## 2015-12-16 MED ORDER — VANCOMYCIN HCL IN DEXTROSE 1-5 GM/200ML-% IV SOLN
1000.0000 mg | Freq: Three times a day (TID) | INTRAVENOUS | Status: DC
  Administered 2015-12-17 – 2015-12-18 (×5): 1000 mg via INTRAVENOUS
  Filled 2015-12-16 (×4): qty 200

## 2015-12-16 MED ORDER — ENOXAPARIN SODIUM 30 MG/0.3ML ~~LOC~~ SOLN
30.0000 mg | SUBCUTANEOUS | Status: DC
  Filled 2015-12-16: qty 0.3

## 2015-12-16 MED ORDER — OXYCODONE HCL 5 MG PO TABS
5.0000 mg | ORAL_TABLET | ORAL | Status: DC | PRN
  Administered 2015-12-16 – 2015-12-17 (×3): 5 mg via ORAL
  Filled 2015-12-16 (×3): qty 1

## 2015-12-16 MED ORDER — INSULIN ASPART 100 UNIT/ML ~~LOC~~ SOLN: 0.0000 [IU] | Freq: Every day | SUBCUTANEOUS | Status: DC

## 2015-12-16 MED ORDER — ENOXAPARIN SODIUM 40 MG/0.4ML ~~LOC~~ SOLN
40.0000 mg | SUBCUTANEOUS | Status: DC
  Administered 2015-12-16 – 2015-12-18 (×3): 40 mg via SUBCUTANEOUS
  Filled 2015-12-16 (×2): qty 0.4

## 2015-12-16 MED ORDER — THIAMINE HCL 100 MG/ML IJ SOLN: 100.0000 mg | Freq: Every day | INTRAMUSCULAR | Status: DC

## 2015-12-16 MED ORDER — ADULT MULTIVITAMIN W/MINERALS CH
1.0000 | ORAL_TABLET | Freq: Every day | ORAL | Status: DC
  Administered 2015-12-16 – 2015-12-18 (×3): 1 via ORAL
  Filled 2015-12-16 (×3): qty 1

## 2015-12-16 MED ORDER — HYDROCHLOROTHIAZIDE 25 MG PO TABS
12.5000 mg | ORAL_TABLET | Freq: Every day | ORAL | Status: DC
  Administered 2015-12-16 – 2015-12-18 (×3): 12.5 mg via ORAL
  Filled 2015-12-16 (×3): qty 1

## 2015-12-16 NOTE — Progress Notes (Signed)
[facility] PROGRESS NOTE  Anthony [name] [id number] DOB: 1961/05/13    PCP:   Nyoka Cowden, MD   HPI: Anthony [name] is an 54 y.o. male with hx of HLD, DM2, ADD, anxiety, daily alcohol use, admitted for punctured wound (stepping on a rock), barefoot, admitted for cellulitis and placed on Clindamycin.  He said his wound has gotten worse since admission.  No fever, chills, or systemic illness.   Rewiew of Systems:  Constitutional: Negative for malaise, fever and chills. No significant weight loss or weight gain Eyes: Negative for eye pain, redness and discharge, diplopia, visual changes, or flashes of light. ENMT: Negative for ear pain, hoarseness, nasal congestion, sinus pressure and sore throat. No headaches; tinnitus, drooling, or problem swallowing. Cardiovascular: Negative for chest pain, palpitations, diaphoresis, dyspnea and peripheral edema. ; No orthopnea, PND Respiratory: Negative for cough, hemoptysis, wheezing and stridor. No pleuritic chestpain. Gastrointestinal: Negative for nausea, vomiting, diarrhea, constipation, abdominal pain, melena, blood in stool, hematemesis, jaundice and rectal bleeding.    Genitourinary: Negative for frequency, dysuria, incontinence,flank pain and hematuria; Musculoskeletal: Negative for back pain and neck pain. Negative for swelling and trauma.;  Skin: . Negative for pruritus, rash, abrasions, bruising and skin lesion.; ulcerations. Neuro: Negative for headache, lightheadedness and neck stiffness. Negative for weakness, altered level of consciousness , altered mental status, extremity weakness, burning feet, involuntary movement, seizure and syncope.  Psych: negative for anxiety, depression, insomnia, tearfulness, panic attacks, hallucinations, paranoia, suicidal or homicidal ideation    Past Medical History  Diagnosis Date  . Anxiety   . GERD (gastroesophageal reflux disease)   . Hyperlipidemia   . Diabetes mellitus   .  ADD (attention deficit disorder with hyperactivity)   . Hx of colonic polyp - sessile serrated polyp 12/07/2014    Past Surgical History  Procedure Laterality Date  . Pilonidal cyst excision      Medications:  HOME MEDS: Prior to Admission medications   Medication Sig Start Date End Date Taking? Authorizing Provider  acetaminophen (TYLENOL) 325 MG tablet Take 650 mg by mouth every 6 (six) hours as needed for moderate pain.   Yes Historical Provider, MD  ACCU-CHEK AVIVA PLUS test strip USE TO TEST BLOOD SUGAR DAILY. 08/08/15   Marletta Lor, MD  ACCU-CHEK SOFTCLIX LANCETS lancets USE TO TEST BLOOD SUGAR DAILY. 08/08/15   Marletta Lor, MD  aspirin 81 MG tablet Take 81 mg by mouth daily.      Historical Provider, MD  atorvastatin (LIPITOR) 40 MG tablet Take 1 tablet (40 mg total) by mouth daily. 08/08/15   Marletta Lor, MD  fluticasone [facility]) 50 MCG/ACT nasal spray Place 2 sprays into both nostrils daily. 11/22/15   Marletta Lor, MD  glipiZIDE (GLUCOTROL XL) 5 MG 24 hr tablet Take 1 tablet (5 mg total) by mouth daily. 08/08/15   Marletta Lor, MD  hydrochlorothiazide (HYDRODIURIL) 12.5 MG tablet Take 1 tablet (12.5 mg total) by mouth daily. 08/08/15   Marletta Lor, MD  labetalol (NORMODYNE) 300 MG tablet Take 1 tablet (300 mg total) by mouth 2 (two) times daily. 08/08/15   Marletta Lor, MD  lisinopril (PRINIVIL,ZESTRIL) 40 MG tablet TAKE ONE (1) TABLET EACH DAY 08/08/15   Marletta Lor, MD  metFORMIN (GLUCOPHAGE) 1000 MG tablet TAKE ONE TABLET TWICE DAILY WITH MEALS 08/08/15   Marletta Lor, MD     Allergies:  No Known Allergies  Social History:   reports that he quit smoking about  19 years ago. He has never used smokeless tobacco. He reports that he drinks alcohol. He reports that he does not use illicit drugs.  Family History: Family History  Problem Relation Age of Onset  . Brain cancer Mother   . Diabetes Father   . Diabetes  Sister   . Colon cancer Neg Hx   . Esophageal cancer Neg Hx   . Rectal cancer Neg Hx   . Stomach cancer Neg Hx   . Stroke Brother   . Seizures Brother      Physical Exam: Filed Vitals:   12/16/15 0328 12/16/15 0419 12/16/15 0819 12/16/15 1219  BP: 110/67 119/58 114/62 101/52  Pulse: 97 97 95 97  Temp:  98.8 F (37.1 C) 98.6 F (37 C) 98.6 F (37 C)  TempSrc:  Oral Oral Oral  Resp: 20 20 20 20   Height:  5\' 8"  (1.727 m)    Weight:  85.503 kg (188 lb 8 oz)    SpO2: 98% 97% 97% 97%   Blood pressure 101/52, pulse 97, temperature 98.6 F (37 C), temperature source Oral, resp. rate 20, height 5\' 8"  (1.727 m), weight 85.503 kg (188 lb 8 oz), SpO2 97 %.  GEN:  Pleasant patient lying in the stretcher in no acute distress; cooperative with exam. PSYCH:  alert and oriented x4; does not appear anxious or depressed; affect is appropriate. HEENT: Mucous membranes pink and anicteric; PERRLA; EOM intact; no cervical lymphadenopathy nor thyromegaly or carotid bruit; no JVD; There were no stridor. Neck is very supple. Breasts:: Not examined CHEST WALL: No tenderness CHEST: Normal respiration, clear to auscultation bilaterally.  HEART: Regular rate and rhythm.  There are no murmur, rub, or gallops.   BACK: No kyphosis or scoliosis; no CVA tenderness ABDOMEN: soft and non-tender; no masses, no organomegaly, normal abdominal bowel sounds; no pannus; no intertriginous candida. There is no rebound and no distention. Rectal Exam: Not done EXTREMITIES: No bone or joint deformity; age-appropriate arthropathy of the hands and knees; no edema; no ulcerations.  There is no calf tenderness. Genitalia: not examined PULSES: 2+ and symmetric SKIN: Normal hydration no rash or ulceration.  There is swelling and erythema with puncture wound seen.  CNS: Cranial nerves 2-12 grossly intact no focal lateralizing neurologic deficit.  Speech is fluent; uvula elevated with phonation, facial symmetry and tongue  midline. DTR are normal bilaterally, cerebella exam is intact, barbinski is negative and strengths are equaled bilaterally.  No sensory loss.   Labs on Admission:  Basic Metabolic Panel:  Recent Labs Lab 12/16/15 0105 12/16/15 0504  NA 135 136  K 4.0 4.1  CL 104 104  CO2 25 22  GLUCOSE 128* 131*  BUN 16 18  CREATININE 0.81 0.89  CALCIUM 8.6* 8.4*   CBC:  Recent Labs Lab 12/16/15 0105 12/16/15 0504  WBC 10.4 8.7  NEUTROABS 7.6  --   HGB 12.3* 10.8*  HCT 35.8* 31.5*  MCV 92.7 91.8  PLT 195 170   CBG:  Recent Labs Lab 12/16/15 0738 12/16/15 1145  GLUCAP 105* 148*     Radiological Exams on Admission: Dg Foot Complete Left  12/16/2015  CLINICAL DATA:  Acute onset of left foot pain and swelling, with associated erythema. Initial encounter. EXAM: LEFT FOOT - COMPLETE 3+ VIEW COMPARISON:  Left foot radiographs performed 02/18/2015 FINDINGS: There is no evidence of fracture or dislocation. A bone island is noted within the first distal phalanx. There is mild chronic cortical irregularity involving the base of the third  distal phalanx, likely reflecting remote injury. The joint spaces are preserved. There is no evidence of talar subluxation; the subtalar joint is unremarkable in appearance. A posterior calcaneal spur is noted. An os peroneum is seen. No significant soft tissue abnormalities are seen. IMPRESSION: 1. No evidence of fracture or dislocation. 2. Os peroneum noted. Electronically Signed   By: Garald Balding M.D.   On: 12/16/2015 01:36    Assessment/Plan Present on Admission:  . Cellulitis and abscess of foot . Essential hypertension Alcohol abuse.   PLAN:  Cellulitis:  Will continue with IV Clinda.  Will add Vancomycin now as he is not better.  Will continue with IVF and IV analgesics.    HTN:  BP is controlled.   Alcohol abuse:  Will add CIWA and give thiamine.    Code Status: FULL Haskel Khan, MD.  FACP [facility] Pager [phone]  7pm to 7am.  12/16/2015, 2:58 PM

## 2015-12-16 NOTE — Care Management Note (Signed)
Case Management Note  Patient Details  Name: MILEZ LAGUEUX MRN: [id number] Date of Birth: 1961/07/17  Subjective/Objective:                  Pt admitted from home with cellulitis. Pt lives alone and will return home at discharge. Pt is independent with ADL's. Pt's grandmother assist pt with medications. Pt uses [facility] for transportation to MD appts.  Action/Plan: No CM needs noted.  Expected Discharge Date:                  Expected Discharge Plan:  Home/Self Care  In-House Referral:  NA  Discharge planning Services  CM Consult  Post Acute Care Choice:  NA Choice offered to:  NA  DME Arranged:    DME Agency:     HH Arranged:    HH Agency:     Status of Service:  Completed, signed off  Medicare Important Message Given:    Date Medicare IM Given:    Medicare IM give by:    Date Additional Medicare IM Given:    Additional Medicare Important Message give by:     If discussed at [place] of Stay Meetings, dates discussed:    Additional Comments:  Joylene Draft, RN 12/16/2015, 11:06 AM

## 2015-12-16 NOTE — ED Provider Notes (Signed)
CSN: Anthony [name]:[id number]     Arrival date & time 12/15/15  2220 History   First MD Initiated Contact with Patient 12/15/15 2355     Chief Complaint  Patient presents with  . Cellulitis     (Consider location/radiation/quality/duration/timing/severity/associated sxs/prior Treatment) The history is provided by the patient.   Anthony [name] is a 54 y.o. male with a history of diabetes presenting with pain and swelling of his left foot.  He states he was walking outdoors barefoot 2 days ago, and believes he stepped on a foreign body, however is unable to identify what this might have been.  Since this event he has had increased pain and swelling and no redness which is spreading to his dorsal foot.  He denies fevers or chills, nausea or vomiting.  He has been weight bearing on his foot, but reports it is uncomfortable.  He describes radiation of aching pain to his knee.  He has taken no medications for this problem.  He last checked his blood sugar 2 days ago and it was less than 100.  Patient's tetanus is up-to-date.     Past Medical History  Diagnosis Date  . Anxiety   . GERD (gastroesophageal reflux disease)   . Hyperlipidemia   . Diabetes mellitus   . ADD (attention deficit disorder with hyperactivity)   . Hx of colonic polyp - sessile serrated polyp 12/07/2014   Past Surgical History  Procedure Laterality Date  . Pilonidal cyst excision     Family History  Problem Relation Age of Onset  . Brain cancer Mother   . Diabetes Father   . Diabetes Sister   . Colon cancer Neg Hx   . Esophageal cancer Neg Hx   . Rectal cancer Neg Hx   . Stomach cancer Neg Hx   . Stroke Brother   . Seizures Brother    Social History  Substance Use Topics  . Smoking status: Former Smoker    Quit date: 08/24/1996  . Smokeless tobacco: Never Used  . Alcohol Use: 0.0 oz/week    0 Standard drinks or equivalent per week     Comment: 3-40 oz a day of beer     Review of Systems  Constitutional: Negative  for fever and chills.  Respiratory: Negative for shortness of breath and wheezing.   Gastrointestinal: Negative for nausea and vomiting.  Skin: Positive for color change and wound.  Neurological: Negative for numbness.      Allergies  Review of patient's allergies indicates no known allergies.  Home Medications   Prior to Admission medications   Medication Sig Start Date End Date Taking? Authorizing Provider  ACCU-CHEK AVIVA PLUS test strip USE TO TEST BLOOD SUGAR DAILY. 08/08/15   Marletta Lor, MD  ACCU-CHEK SOFTCLIX LANCETS lancets USE TO TEST BLOOD SUGAR DAILY. 08/08/15   Marletta Lor, MD  aspirin 81 MG tablet Take 81 mg by mouth daily.      Historical Provider, MD  atorvastatin (LIPITOR) 40 MG tablet Take 1 tablet (40 mg total) by mouth daily. 08/08/15   Marletta Lor, MD  fluticasone [facility]) 50 MCG/ACT nasal spray Place 2 sprays into both nostrils daily. 11/22/15   Marletta Lor, MD  glipiZIDE (GLUCOTROL XL) 5 MG 24 hr tablet Take 1 tablet (5 mg total) by mouth daily. 08/08/15   Marletta Lor, MD  hydrochlorothiazide (HYDRODIURIL) 12.5 MG tablet Take 1 tablet (12.5 mg total) by mouth daily. 08/08/15   Marletta Lor, MD  labetalol (  NORMODYNE) 300 MG tablet Take 1 tablet (300 mg total) by mouth 2 (two) times daily. 08/08/15   Marletta Lor, MD  lisinopril (PRINIVIL,ZESTRIL) 40 MG tablet TAKE ONE (1) TABLET EACH DAY 08/08/15   Marletta Lor, MD  metFORMIN (GLUCOPHAGE) 1000 MG tablet TAKE ONE TABLET TWICE DAILY WITH MEALS 08/08/15   Marletta Lor, MD   BP 123/86 mmHg  Pulse 93  Temp(Src) 98.4 F (36.9 C) (Oral)  Resp 20  Ht 5\' 8"  (1.727 m)  Wt 86.183 kg  BMI 28.90 kg/m2  SpO2 97% Physical Exam  Constitutional: He appears well-developed and well-nourished.  HENT:  Head: Atraumatic.  Neck: Normal range of motion.  Cardiovascular:  Pulses:      Dorsalis pedis pulses are 2+ on the right side, and 2+ on the left side.  Pulses  equal bilaterally  Musculoskeletal: He exhibits tenderness.  Neurological: He is alert. He has normal strength. He displays normal reflexes. No sensory deficit.  Skin: Skin is warm and dry. There is erythema.  Erythema and mild edema of left foot, most prominent along the medial aspect both plantar and dorsal.  There is a small puncture wound at his first plantar metatarsal head region.  There is no purulent drainage, no fluctuance.  No palpable foreign body.  Erythema extends to the ankle.  There is no red streaking beyond the ankle.  Psychiatric: He has a normal mood and affect.    ED Course  Procedures (including critical care time) Labs Review Labs Reviewed  CBC WITH DIFFERENTIAL/PLATELET - Abnormal; Notable for the following:    RBC 3.86 (*)    Hemoglobin 12.3 (*)    HCT 35.8 (*)    All other components within normal limits  BASIC METABOLIC PANEL - Abnormal; Notable for the following:    Glucose, Bld 128 (*)    Calcium 8.6 (*)    All other components within normal limits    Imaging Review Dg Foot Complete Left  12/16/2015  CLINICAL DATA:  Acute onset of left foot pain and swelling, with associated erythema. Initial encounter. EXAM: LEFT FOOT - COMPLETE 3+ VIEW COMPARISON:  Left foot radiographs performed 02/18/2015 FINDINGS: There is no evidence of fracture or dislocation. A bone island is noted within the first distal phalanx. There is mild chronic cortical irregularity involving the base of the third distal phalanx, likely reflecting remote injury. The joint spaces are preserved. There is no evidence of talar subluxation; the subtalar joint is unremarkable in appearance. A posterior calcaneal spur is noted. An os peroneum is seen. No significant soft tissue abnormalities are seen. IMPRESSION: 1. No evidence of fracture or dislocation. 2. Os peroneum noted. Electronically Signed   By: Garald Balding M.D.   On: 12/16/2015 01:36   I have personally reviewed and evaluated these images  and lab results as part of my medical decision-making.   EKG Interpretation None      MDM   Final diagnoses:  Cellulitis of left lower extremity  Puncture wound of foot, left, initial encounter    Patients  labs reviewed.  Radiological studies were viewed, interpreted and considered during the medical decision making and disposition process. I agree with radiologists reading.  Results were also discussed with patient.  He was given IV clindamycin. Will need admission for continued IV abx.    Pt seen by Dr. Tomi Bamberger during this visit.     Evalee Jefferson, PA-C 12/16/15 [id number]  Rolland Porter, MD 12/16/15 [phone]

## 2015-12-16 NOTE — Progress Notes (Signed)
ANTIBIOTIC CONSULT NOTE - INITIAL  Pharmacy Consult for Vancomycin Indication: wound infection  No Known Allergies  Patient Measurements: Height: 5\' 8"  (172.7 cm) Weight: 188 lb 8 oz (85.503 kg) IBW/kg (Calculated) : 68.4 Adjusted Body Weight:   Vital Signs: Temp: 98.6 F (37 C) (12/26 1219) Temp Source: Oral (12/26 1219) BP: 101/52 mmHg (12/26 1219) Pulse Rate: 97 (12/26 1219) Intake/Output from previous day:   Intake/Output from this shift:    Labs:  Recent Labs  12/16/15 0105 12/16/15 0504  WBC 10.4 8.7  HGB 12.3* 10.8*  PLT 195 170  CREATININE 0.81 0.89   Estimated Creatinine Clearance: 102.1 mL/min (by C-G formula based on Cr of 0.89). No results for input(s): VANCOTROUGH, VANCOPEAK, VANCORANDOM, GENTTROUGH, GENTPEAK, GENTRANDOM, TOBRATROUGH, TOBRAPEAK, TOBRARND, AMIKACINPEAK, AMIKACINTROU, AMIKACIN in the last 72 hours.   Microbiology: No results found for this or any previous visit (from the past 720 hour(s)).  Medical History: Past Medical History  Diagnosis Date  . Anxiety   . GERD (gastroesophageal reflux disease)   . Hyperlipidemia   . Diabetes mellitus   . ADD (attention deficit disorder with hyperactivity)   . Hx of colonic polyp - sessile serrated polyp 12/07/2014    Medications:  Scheduled:  . aspirin EC  81 mg Oral Daily  . atorvastatin  40 mg Oral Daily  . clindamycin (CLEOCIN) IV  600 mg Intravenous 3 times per day  . enoxaparin (LOVENOX) injection  40 mg Subcutaneous Q24H  . folic acid  1 mg Oral Daily  . hydrochlorothiazide  12.5 mg Oral Daily  . insulin aspart  0-5 Units Subcutaneous QHS  . insulin aspart  0-9 Units Subcutaneous TID WC  . labetalol  300 mg Oral BID  . lisinopril  40 mg Oral Daily  . multivitamin with minerals  1 tablet Oral Daily  . thiamine  100 mg Oral Daily   Or  . thiamine  100 mg Intravenous Daily  . vancomycin  1,500 mg Intravenous Once  . [START ON 12/17/2015] vancomycin  1,000 mg Intravenous Q8H    Assessment: 55 y.o. male complaints of increasing redness, pain and swelling of his left foot progressing over 2 days.Reports was walking outside bare foot, stepped on something that punctured left foot. Redness began to spread from the wound on the base of great toe up foot.   Goal of Therapy:  Vancomycin trough level 15-20 mcg/ml  Plan:  Vancomycin 1500 mg IV loading dose, then Vancomycin 1 GM IV every 8 hours. Vancomycin trough at steady state Monitor renal function Labs per protocol  Chriss Czar 12/16/2015,3:06 PM

## 2015-12-16 NOTE — H&P (Signed)
[facility] Admission History and Physical       SIMUEL [name] [id number] DOB: [birth year] DOA: 12/15/2015  Referring physician: EDP PCP: Nyoka Cowden, MD  Specialists:   Chief Complaint: Redness and Swelling of Left Foot  HPI: Anthony [name] is a 54 y.o. male with a history of IDDM who presents to the ED with complaints of increasing redness, pain and swelling of his left foot progressing over 2 days.  He reports that he was walking outside bare foot and stepped on something that punctured his left foot.   He soaked his foot but he said it would swell up more, and then he would place ice on the foot to relieve the swelling.  The redness began to spread from the wound on the base of his great toe up his foot.   He denies having any fever or chills.   He was seen in the ED, and an x-ray was performed which was negative for fracture    Review of Systems:  Constitutional: No Weight Loss, No Weight Gain, Night Sweats, Fevers, Chills, Dizziness, Light Headedness, Fatigue, or Generalized Weakness HEENT: No Headaches, Difficulty Swallowing,Tooth/Dental Problems,Sore Throat,  No Sneezing, Rhinitis, Ear Ache, Nasal Congestion, or Post Nasal Drip,  Cardio-vascular:  No Chest pain, Orthopnea, PND, Edema in Lower Extremities, Anasarca, Dizziness, Palpitations  Resp: No Dyspnea, No DOE, No Productive Cough, No Non-Productive Cough, No Hemoptysis, No Wheezing.    GI: No Heartburn, Indigestion, Abdominal Pain, Nausea, Vomiting, Diarrhea, Constipation, Hematemesis, Hematochezia, Melena, Change in Bowel Habits,  Loss of Appetite  GU: No Dysuria, No Change in Color of Urine, No Urgency or Urinary Frequency, No Flank pain.  Musculoskeletal: No Joint Pain or Swelling, No Decreased Range of Motion, No Back Pain.  Neurologic: No Syncope, No Seizures, Muscle Weakness, Paresthesia, Vision Disturbance or Loss, No Diplopia, No Vertigo, No Difficulty Walking,  Skin: No Rash, +Redness of Left  Foot, Ulcer of Left 1st MTP . Psych: No Change in Mood or Affect, No Depression or Anxiety, No Memory loss, No Confusion, or Hallucinations   Past Medical History  Diagnosis Date  . Anxiety   . GERD (gastroesophageal reflux disease)   . Hyperlipidemia   . Diabetes mellitus   . ADD (attention deficit disorder with hyperactivity)   . Hx of colonic polyp - sessile serrated polyp 12/07/2014     Past Surgical History  Procedure Laterality Date  . Pilonidal cyst excision        Prior to Admission medications   Medication Sig Start Date End Date Taking? Authorizing Provider  ACCU-CHEK AVIVA PLUS test strip USE TO TEST BLOOD SUGAR DAILY. 08/08/15   Marletta Lor, MD  ACCU-CHEK SOFTCLIX LANCETS lancets USE TO TEST BLOOD SUGAR DAILY. 08/08/15   Marletta Lor, MD  aspirin 81 MG tablet Take 81 mg by mouth daily.      Historical Provider, MD  atorvastatin (LIPITOR) 40 MG tablet Take 1 tablet (40 mg total) by mouth daily. 08/08/15   Marletta Lor, MD  fluticasone [facility]) 50 MCG/ACT nasal spray Place 2 sprays into both nostrils daily. 11/22/15   Marletta Lor, MD  glipiZIDE (GLUCOTROL XL) 5 MG 24 hr tablet Take 1 tablet (5 mg total) by mouth daily. 08/08/15   Marletta Lor, MD  hydrochlorothiazide (HYDRODIURIL) 12.5 MG tablet Take 1 tablet (12.5 mg total) by mouth daily. 08/08/15   Marletta Lor, MD  labetalol (NORMODYNE) 300 MG tablet Take 1 tablet (300 mg total) by mouth 2 (  two) times daily. 08/08/15   Marletta Lor, MD  lisinopril (PRINIVIL,ZESTRIL) 40 MG tablet TAKE ONE (1) TABLET EACH DAY 08/08/15   Marletta Lor, MD  metFORMIN (GLUCOPHAGE) 1000 MG tablet TAKE ONE TABLET TWICE DAILY WITH MEALS 08/08/15   Marletta Lor, MD     No Known Allergies  Social History:  reports that he quit smoking about 19 years ago. He has never used smokeless tobacco. He reports that he drinks alcohol. He reports that he does not use illicit drugs.    Family  History  Problem Relation Age of Onset  . Brain cancer Mother   . Diabetes Father   . Diabetes Sister   . Colon cancer Neg Hx   . Esophageal cancer Neg Hx   . Rectal cancer Neg Hx   . Stomach cancer Neg Hx   . Stroke Brother   . Seizures Brother        Physical Exam:  GEN:  Pleasant Well Nourished and Well Developed  54 y.o. Caucasian male examined and in no acute distress; cooperative with exam Filed Vitals:   12/15/15 2259 12/16/15 0151  BP: 143/89 123/86  Pulse: 102 93  Temp: 98.4 F (36.9 C)   TempSrc: Oral   Resp: 18 20  Height: 5\' 8"  (1.727 m)   Weight: 86.183 kg (190 lb)   SpO2: 98% 97%   Blood pressure 123/86, pulse 93, temperature 98.4 F (36.9 C), temperature source Oral, resp. rate 20, height 5\' 8"  (1.727 m), weight 86.183 kg (190 lb), SpO2 97 %. PSYCH: He is alert and oriented x4; does not appear anxious does not appear depressed; affect is normal HEENT: Normocephalic and Atraumatic, Mucous membranes pink; PERRLA; EOM intact; Fundi:  Benign;  No scleral icterus, Nares: Patent, Oropharynx: Clear, Fair Dentition,    Neck:  FOM, No Cervical Lymphadenopathy nor Thyromegaly or Carotid Bruit; No JVD; Breasts:: Not examined CHEST WALL: No tenderness CHEST: Normal respiration, clear to auscultation bilaterally HEART: Regular rate and rhythm; no murmurs rubs or gallops BACK: No kyphosis or scoliosis; No CVA tenderness ABDOMEN: Positive Bowel Sounds, Soft Non-Tender, No Rebound or Guarding; No Masses, No Organomegaly Rectal Exam: Not done EXTREMITIES: No Cyanosis, Clubbing, 1+Edemaof Left Foot; +Punctate Wound on the Plantar and Base of the Left Great Toe MTP area with Confluent Erythema Genitalia: not examined PULSES: 2+ and symmetric SKIN: Normal hydration no rash  CNS:  Alert and Oriented x 4, No Focal Deficits Vascular: pulses palpable throughout    Labs on Admission:  Basic Metabolic Panel:  Recent Labs Lab 12/16/15 0105  NA 135  K 4.0  CL 104  CO2 25    GLUCOSE 128*  BUN 16  CREATININE 0.81  CALCIUM 8.6*   Liver Function Tests: No results for input(s): AST, ALT, ALKPHOS, BILITOT, PROT, ALBUMIN in the last 168 hours. No results for input(s): LIPASE, AMYLASE in the last 168 hours. No results for input(s): AMMONIA in the last 168 hours. CBC:  Recent Labs Lab 12/16/15 0105  WBC 10.4  NEUTROABS 7.6  HGB 12.3*  HCT 35.8*  MCV 92.7  PLT 195   Cardiac Enzymes: No results for input(s): CKTOTAL, CKMB, CKMBINDEX, TROPONINI in the last 168 hours.  BNP (last 3 results) No results for input(s): BNP in the last 8760 hours.  ProBNP (last 3 results) No results for input(s): PROBNP in the last 8760 hours.  CBG: No results for input(s): GLUCAP in the last 168 hours.  Radiological Exams on Admission: Dg Foot Complete  Left  12/16/2015  CLINICAL DATA:  Acute onset of left foot pain and swelling, with associated erythema. Initial encounter. EXAM: LEFT FOOT - COMPLETE 3+ VIEW COMPARISON:  Left foot radiographs performed 02/18/2015 FINDINGS: There is no evidence of fracture or dislocation. A bone island is noted within the first distal phalanx. There is mild chronic cortical irregularity involving the base of the third distal phalanx, likely reflecting remote injury. The joint spaces are preserved. There is no evidence of talar subluxation; the subtalar joint is unremarkable in appearance. A posterior calcaneal spur is noted. An os peroneum is seen. No significant soft tissue abnormalities are seen. IMPRESSION: 1. No evidence of fracture or dislocation. 2. Os peroneum noted. Electronically Signed   By: Garald Balding M.D.   On: 12/16/2015 01:36       Assessment/Plan:     54 y.o. male with  Principal Problem:    Cellulitis and abscess of foot   IV Clindamycin   Active Problems:      Puncture wound of foot   Tetanus Vaccine Ordered      Essential hypertension   Continue Lisinopril Rx   Monitor BPs      Diabetes mellitus without  complication (HCC)   Hold Oral Dm meds   SSI Coverage PRN   Check HbA1C in AM    DVT Prophylaxis     Lovenox    Code Status:     FULL CODE    Family Communication:   No Family Present    Disposition Plan:    Inpatient Status        Time spent:  [place] [facility] Pager [phone]   If 7AM -7PM Please Contact the Day Rounding Team MD for [facility]  If 7PM-7AM, Please Contact Night-Floor Coverage  [email or web address] Password [email or web address] 12/16/2015, 2:57 AM     ADDENDUM:   Patient was seen and examined on 12/16/2015

## 2015-12-16 NOTE — ED Provider Notes (Addendum)
Patient reports he was walking outside 2 days ago and stepped on something with his left foot. He states he started getting pain, redness, and swelling with pain shooting up into his left femoral triangle area.   Patient's noted to have a puncture wound on the sole of his left foot proximal to the MTP joint of the great toe. He is noted to have a lot of redness and swelling around the MTP area of increased left great toe that extends to the medial aspect of his foot in the medial aspect of the dorsum of his foot that extends up to the ankle with a faint red streak about halfway up his lower leg. He is very tender to palpation in his left femoral triangle without distinct lymphadenopathy felt yet.       01:41 Dr Gasper Lloyd, hospitalist, admit to [place] screening examination/treatment/procedure(s) were conducted as a shared visit with non-physician practitioner(s) and myself.  I personally evaluated the patient during the encounter.   EKG Interpretation None       Rolland Porter, MD, Barbette Or, MD 12/16/15 [place]  Rolland Porter, MD 12/16/15 [phone]

## 2015-12-17 LAB — HEMOGLOBIN A1C
Hgb A1c MFr Bld: 6.1 % — ABNORMAL HIGH (ref 4.8–5.6)
Mean Plasma Glucose: 128 mg/dL

## 2015-12-17 LAB — CBC
HCT: 30.6 % — ABNORMAL LOW (ref 39.0–52.0)
Hemoglobin: 10.4 g/dL — ABNORMAL LOW (ref 13.0–17.0)
MCH: 32.1 pg (ref 26.0–34.0)
MCHC: 34 g/dL (ref 30.0–36.0)
MCV: 94.4 fL (ref 78.0–100.0)
PLATELETS: 175 10*3/uL (ref 150–400)
RBC: 3.24 MIL/uL — ABNORMAL LOW (ref 4.22–5.81)
RDW: 13 % (ref 11.5–15.5)
WBC: 8.7 10*3/uL (ref 4.0–10.5)

## 2015-12-17 LAB — GLUCOSE, CAPILLARY
GLUCOSE-CAPILLARY: 176 mg/dL — AB (ref 65–99)
GLUCOSE-CAPILLARY: 132 mg/dL — AB (ref 65–99)
GLUCOSE-CAPILLARY: 112 mg/dL — AB (ref 65–99)
Glucose-Capillary: 134 mg/dL — ABNORMAL HIGH (ref 65–99)

## 2015-12-17 LAB — BASIC METABOLIC PANEL
Anion gap: 8 (ref 5–15)
BUN: 17 mg/dL (ref 6–20)
CHLORIDE: 102 mmol/L (ref 101–111)
CO2: 23 mmol/L (ref 22–32)
CREATININE: 0.85 mg/dL (ref 0.61–1.24)
Calcium: 8.7 mg/dL — ABNORMAL LOW (ref 8.9–10.3)
GFR calc Af Amer: >60 mL/min (ref >60)
Glucose, Bld: 138 mg/dL — ABNORMAL HIGH (ref 65–99)
Potassium: 4 mmol/L (ref 3.5–5.1)
SODIUM: 133 mmol/L — AB (ref 135–145)

## 2015-12-17 NOTE — Progress Notes (Signed)
[facility] PROGRESS NOTE  Anthony [name] [id number] DOB: 1961-10-15    PCP:   Nyoka Cowden, MD   HPI:   Anthony [name] is an 54 y.o. male with hx of HLD, DM2, ADD, anxiety, daily alcohol use, admitted for punctured wound (stepping on a rock), barefoot, admitted for cellulitis and placed on Clindamycin. He said his wound has gotten worse since admission. No fever, chills, or systemic illness. Vancomycin was added yesterday, and he has significant improvement.   Rewiew of Systems:  Constitutional: Negative for malaise, fever and chills. No significant weight loss or weight gain Eyes: Negative for eye pain, redness and discharge, diplopia, visual changes, or flashes of light. ENMT: Negative for ear pain, hoarseness, nasal congestion, sinus pressure and sore throat. No headaches; tinnitus, drooling, or problem swallowing. Cardiovascular: Negative for chest pain, palpitations, diaphoresis, dyspnea and peripheral edema. ; No orthopnea, PND Respiratory: Negative for cough, hemoptysis, wheezing and stridor. No pleuritic chestpain. Gastrointestinal: Negative for nausea, vomiting, diarrhea, constipation, abdominal pain, melena, blood in stool, hematemesis, jaundice and rectal bleeding.    Genitourinary: Negative for frequency, dysuria, incontinence,flank pain and hematuria; Musculoskeletal: Negative for back pain and neck pain. Negative for swelling and trauma.;  Skin: . Negative for pruritus, rash, abrasions, bruising and skin lesion.; ulcerations Neuro: Negative for headache, lightheadedness and neck stiffness. Negative for weakness, altered level of consciousness , altered mental status, extremity weakness, burning feet, involuntary movement, seizure and syncope.  Psych: negative for anxiety, depression, insomnia, tearfulness, panic attacks, hallucinations, paranoia, suicidal or homicidal ideation   Past Medical History  Diagnosis Date  . Anxiety   . GERD  (gastroesophageal reflux disease)   . Hyperlipidemia   . Diabetes mellitus   . ADD (attention deficit disorder with hyperactivity)   . Hx of colonic polyp - sessile serrated polyp 12/07/2014    Past Surgical History  Procedure Laterality Date  . Pilonidal cyst excision      Medications:  HOME MEDS: Prior to Admission medications   Medication Sig Start Date End Date Taking? Authorizing Provider  acetaminophen (TYLENOL) 325 MG tablet Take 650 mg by mouth every 6 (six) hours as needed for moderate pain.   Yes Historical Provider, MD  aspirin 81 MG tablet Take 81 mg by mouth daily.     Yes Historical Provider, MD  atorvastatin (LIPITOR) 40 MG tablet Take 1 tablet (40 mg total) by mouth daily. 08/08/15  Yes Marletta Lor, MD  fluticasone [facility]) 50 MCG/ACT nasal spray Place 2 sprays into both nostrils daily. 11/22/15  Yes Marletta Lor, MD  glipiZIDE (GLUCOTROL XL) 5 MG 24 hr tablet Take 1 tablet (5 mg total) by mouth daily. 08/08/15  Yes Marletta Lor, MD  hydrochlorothiazide (MICROZIDE) 12.5 MG capsule Take 1 capsule by mouth daily. 11/20/15  Yes Historical Provider, MD  labetalol (NORMODYNE) 300 MG tablet Take 1 tablet (300 mg total) by mouth 2 (two) times daily. 08/08/15  Yes Marletta Lor, MD  lisinopril (PRINIVIL,ZESTRIL) 40 MG tablet TAKE ONE (1) TABLET EACH DAY 08/08/15  Yes Marletta Lor, MD  metFORMIN (GLUCOPHAGE) 1000 MG tablet TAKE ONE TABLET TWICE DAILY WITH MEALS 08/08/15  Yes Marletta Lor, MD  ACCU-CHEK AVIVA PLUS test strip USE TO TEST BLOOD SUGAR DAILY. 08/08/15   Marletta Lor, MD  ACCU-CHEK SOFTCLIX LANCETS lancets USE TO TEST BLOOD SUGAR DAILY. 08/08/15   Marletta Lor, MD     Allergies:  No Known Allergies  Social History:   reports that  he quit smoking about 19 years ago. He has never used smokeless tobacco. He reports that he drinks alcohol. He reports that he does not use illicit drugs.  Family History: Family History   Problem Relation Age of Onset  . Brain cancer Mother   . Diabetes Father   . Diabetes Sister   . Colon cancer Neg Hx   . Esophageal cancer Neg Hx   . Rectal cancer Neg Hx   . Stomach cancer Neg Hx   . Stroke Brother   . Seizures Brother      Physical Exam: Filed Vitals:   12/16/15 2043 12/16/15 2231 12/17/15 0618 12/17/15 1200  BP: 123/76 136/77 106/57 120/62  Pulse: 80 77 84 97  Temp: 98.9 F (37.2 C) 98.7 F (37.1 C) 98.9 F (37.2 C) 99 F (37.2 C)  TempSrc: Oral Oral Oral Oral  Resp: 18  20 20   Height:      Weight:      SpO2: 98% 100% 95% 100%   Blood pressure 120/62, pulse 97, temperature 99 F (37.2 C), temperature source Oral, resp. rate 20, height 5\' 8"  (1.727 m), weight 85.503 kg (188 lb 8 oz), SpO2 100 %.  GEN:  Pleasant  patient lying in the stretcher in no acute distress; cooperative with exam. PSYCH:  alert and oriented x4; does not appear anxious or depressed; affect is appropriate. HEENT: Mucous membranes pink and anicteric; PERRLA; EOM intact; no cervical lymphadenopathy nor thyromegaly or carotid bruit; no JVD; There were no stridor. Neck is very supple. Breasts:: Not examined CHEST WALL: No tenderness CHEST: Normal respiration, clear to auscultation bilaterally.  HEART: Regular rate and rhythm.  There are no murmur, rub, or gallops.   BACK: No kyphosis or scoliosis; no CVA tenderness ABDOMEN: soft and non-tender; no masses, no organomegaly, normal abdominal bowel sounds; no pannus; no intertriginous candida. There is no rebound and no distention. Rectal Exam: Not done EXTREMITIES: No bone or joint deformity; age-appropriate arthropathy of the hands and knees; no edema; no ulcerations.  There is no calf tenderness. Genitalia: not examined PULSES: 2+ and symmetric SKIN: Marked improvement of his foot.  CNS: Cranial nerves 2-12 grossly intact no focal lateralizing neurologic deficit.  Speech is fluent; uvula elevated with phonation, facial symmetry and  tongue midline. DTR are normal bilaterally, cerebella exam is intact, barbinski is negative and strengths are equaled bilaterally.  No sensory loss.   Labs on Admission:  Basic Metabolic Panel:  Recent Labs Lab 12/16/15 0105 12/16/15 0504 12/17/15 0554  NA 135 136 133*  K 4.0 4.1 4.0  CL 104 104 102  CO2 25 22 23   GLUCOSE 128* 131* 138*  BUN 16 18 17   CREATININE 0.81 0.89 0.85  CALCIUM 8.6* 8.4* 8.7*   Liver Function Tests: CBC:  Recent Labs Lab 12/16/15 0105 12/16/15 0504 12/17/15 0554  WBC 10.4 8.7 8.7  NEUTROABS 7.6  --   --   HGB 12.3* 10.8* 10.4*  HCT 35.8* 31.5* 30.6*  MCV 92.7 91.8 94.4  PLT 195 170 175   CBG:  Recent Labs Lab 12/16/15 1145 12/16/15 1644 12/16/15 2005 12/17/15 0744 12/17/15 1118  GLUCAP 148* 107* 108* 134* 176*     Radiological Exams on Admission: Dg Foot Complete Left  12/16/2015  CLINICAL DATA:  Acute onset of left foot pain and swelling, with associated erythema. Initial encounter. EXAM: LEFT FOOT - COMPLETE 3+ VIEW COMPARISON:  Left foot radiographs performed 02/18/2015 FINDINGS: There is no evidence of fracture or dislocation. A  bone island is noted within the first distal phalanx. There is mild chronic cortical irregularity involving the base of the third distal phalanx, likely reflecting remote injury. The joint spaces are preserved. There is no evidence of talar subluxation; the subtalar joint is unremarkable in appearance. A posterior calcaneal spur is noted. An os peroneum is seen. No significant soft tissue abnormalities are seen. IMPRESSION: 1. No evidence of fracture or dislocation. 2. Os peroneum noted. Electronically Signed   By: Garald Balding M.D.   On: 12/16/2015 01:36   Assessment/Plan Present on Admission:  . Cellulitis and abscess of foot . Essential hypertension  PLAN:   Cellulitis :  Will continue IV antibiotics for today.  Likely able to be discharged tomorrow.  Given dramatic improvement with IV vanco, would  discharge him on Bactrim DS if possible.    ALCOHOL ABUSE:   Mild withdrawal symptoms.  Continue with CIWA.   Other plans as per orders. Code Status: FULL Haskel Khan, MD.  FACP [facility] Pager [phone] 7pm to 7am.  12/17/2015, 2:00 PM

## 2015-12-18 DIAGNOSIS — S91332D Puncture wound without foreign body, left foot, subsequent encounter: Secondary | ICD-10-CM

## 2015-12-18 LAB — BASIC METABOLIC PANEL
ANION GAP: 9 (ref 5–15)
BUN: 11 mg/dL (ref 6–20)
CO2: 26 mmol/L (ref 22–32)
Calcium: 8.9 mg/dL (ref 8.9–10.3)
Chloride: 98 mmol/L — ABNORMAL LOW (ref 101–111)
Creatinine, Ser: 0.79 mg/dL (ref 0.61–1.24)
GFR calc Af Amer: >60 mL/min (ref >60)
GLUCOSE: 141 mg/dL — AB (ref 65–99)
POTASSIUM: 3.8 mmol/L (ref 3.5–5.1)
Sodium: 133 mmol/L — ABNORMAL LOW (ref 135–145)

## 2015-12-18 LAB — GLUCOSE, CAPILLARY
GLUCOSE-CAPILLARY: 146 mg/dL — AB (ref 65–99)
Glucose-Capillary: 201 mg/dL — ABNORMAL HIGH (ref 65–99)

## 2015-12-18 MED ORDER — ADULT MULTIVITAMIN W/MINERALS CH: 1.0000 | ORAL_TABLET | Freq: Every day | ORAL | Status: DC

## 2015-12-18 MED ORDER — SULFAMETHOXAZOLE-TRIMETHOPRIM 800-160 MG PO TABS: 1.0000 | ORAL_TABLET | Freq: Two times a day (BID) | ORAL | Status: DC

## 2015-12-18 NOTE — Progress Notes (Signed)
Pt's IV catheter removed and intact. Pt's IV site clean dry and intact. Discharge instructions, medications, and follow up appointments reviewed and discussed with patient. All questions were answered and no further questions at this time. Pt verbalized understanding of medications and discharge instructions. Pt in stable condition and in no acute distress at time of discharge.

## 2015-12-18 NOTE — Discharge Summary (Signed)
Physician Discharge Summary  Anthony [name] [id number] DOB: July 24, 1961 DOA: 12/15/2015  PCP: Nyoka Cowden, MD  Admit date: 12/15/2015 Discharge date: 12/18/2015  Time spent: 45 minutes  Recommendations for Outpatient Follow-up:  -Will be discharged home today. -Advised to follow-up with primary care provider in 2 weeks for follow-up of his left foot cellulitis.   Discharge Diagnoses:  Principal Problem:   Cellulitis and abscess of foot Active Problems:   Essential hypertension   Diabetes mellitus without complication ([place])   Puncture wound of foot   Discharge Condition: Stable and improved  Filed Weights   12/15/15 2259 12/16/15 0419  Weight: 86.183 kg (190 lb) 85.503 kg (188 lb 8 oz)    History of present illness:  As per Dr. Arnoldo Morale on 12/26: Anthony [name] is a 53 y.o. male with a history of IDDM who presents to the ED with complaints of increasing redness, pain and swelling of his left foot progressing over 2 days. He reports that he was walking outside bare foot and stepped on something that punctured his left foot. He soaked his foot but he said it would swell up more, and then he would place ice on the foot to relieve the swelling. The redness began to spread from the wound on the base of his great toe up his foot. He denies having any fever or chills. He was seen in the ED, and an x-ray was performed which was negative for fracture   Hospital Course:   Left foot cellulitis -Markedly improved. -We'll discharge him on Bactrim double strength for 14 days. -Advised close medical follow-up for his cellulitis.  Alcohol abuse -Continue thiamine, folate, multivitamin. -Cessation counseling provided.  Type 2 diabetes -Fair control while in the hospital.  Procedures:  None   Consultations:  None  Discharge Instructions  Discharge Instructions    Diet - low sodium heart healthy    Complete by:  As directed      Increase activity  slowly    Complete by:  As directed             Medication List    TAKE these medications        ACCU-CHEK AVIVA PLUS test strip  Generic drug:  glucose blood  USE TO TEST BLOOD SUGAR DAILY.     ACCU-CHEK SOFTCLIX LANCETS lancets  USE TO TEST BLOOD SUGAR DAILY.     acetaminophen 325 MG tablet  Commonly known as:  TYLENOL  Take 650 mg by mouth every 6 (six) hours as needed for moderate pain.     aspirin 81 MG tablet  Take 81 mg by mouth daily.     atorvastatin 40 MG tablet  Commonly known as:  LIPITOR  Take 1 tablet (40 mg total) by mouth daily.     fluticasone 50 MCG/ACT nasal spray  Commonly known as:  FLONASE  Place 2 sprays into both nostrils daily.     glipiZIDE 5 MG 24 hr tablet  Commonly known as:  GLUCOTROL XL  Take 1 tablet (5 mg total) by mouth daily.     hydrochlorothiazide 12.5 MG capsule  Commonly known as:  MICROZIDE  Take 1 capsule by mouth daily.     labetalol 300 MG tablet  Commonly known as:  NORMODYNE  Take 1 tablet (300 mg total) by mouth 2 (two) times daily.     lisinopril 40 MG tablet  Commonly known as:  PRINIVIL,ZESTRIL  TAKE ONE (1) TABLET EACH DAY  metFORMIN 1000 MG tablet  Commonly known as:  GLUCOPHAGE  TAKE ONE TABLET TWICE DAILY WITH MEALS     multivitamin with minerals Tabs tablet  Take 1 tablet by mouth daily.     sulfamethoxazole-trimethoprim 800-160 MG tablet  Commonly known as:  BACTRIM DS  Take 1 tablet by mouth 2 (two) times daily.       No Known Allergies    The results of significant diagnostics from this hospitalization (including imaging, microbiology, ancillary and laboratory) are listed below for reference.    Significant Diagnostic Studies: Dg Foot Complete Left  12/16/2015  CLINICAL DATA:  Acute onset of left foot pain and swelling, with associated erythema. Initial encounter. EXAM: LEFT FOOT - COMPLETE 3+ VIEW COMPARISON:  Left foot radiographs performed 02/18/2015 FINDINGS: There is no evidence of  fracture or dislocation. A bone island is noted within the first distal phalanx. There is mild chronic cortical irregularity involving the base of the third distal phalanx, likely reflecting remote injury. The joint spaces are preserved. There is no evidence of talar subluxation; the subtalar joint is unremarkable in appearance. A posterior calcaneal spur is noted. An os peroneum is seen. No significant soft tissue abnormalities are seen. IMPRESSION: 1. No evidence of fracture or dislocation. 2. Os peroneum noted. Electronically Signed   By: Garald Balding M.D.   On: 12/16/2015 01:36    Microbiology: No results found for this or any previous visit (from the past 240 hour(s)).   Labs: Basic Metabolic Panel:  Recent Labs Lab 12/16/15 0105 12/16/15 0504 12/17/15 0554 12/18/15 0601  NA 135 136 133* 133*  K 4.0 4.1 4.0 3.8  CL 104 104 102 98*  CO2 25 22 23 26   GLUCOSE 128* 131* 138* 141*  BUN 16 18 17 11   CREATININE 0.81 0.89 0.85 0.79  CALCIUM 8.6* 8.4* 8.7* 8.9   Liver Function Tests: No results for input(s): AST, ALT, ALKPHOS, BILITOT, PROT, ALBUMIN in the last 168 hours. No results for input(s): LIPASE, AMYLASE in the last 168 hours. No results for input(s): AMMONIA in the last 168 hours. CBC:  Recent Labs Lab 12/16/15 0105 12/16/15 0504 12/17/15 0554  WBC 10.4 8.7 8.7  NEUTROABS 7.6  --   --   HGB 12.3* 10.8* 10.4*  HCT 35.8* 31.5* 30.6*  MCV 92.7 91.8 94.4  PLT 195 170 175   Cardiac Enzymes: No results for input(s): CKTOTAL, CKMB, CKMBINDEX, TROPONINI in the last 168 hours. BNP: BNP (last 3 results) No results for input(s): BNP in the last 8760 hours.  ProBNP (last 3 results) No results for input(s): PROBNP in the last 8760 hours.  CBG:  Recent Labs Lab 12/17/15 1118 12/17/15 1631 12/17/15 2027 12/18/15 0743 12/18/15 1132  GLUCAP 176* 132* 112* 146* 201*       Signed:  HERNANDEZ ACOSTA,ESTELA  [facility] Pager: [phone] 12/18/2015, 2:49  PM

## 2015-12-18 NOTE — Progress Notes (Signed)
Pt has loss of IV access. MD notified and made aware. Per MD okay to not restart IV.

## 2015-12-18 NOTE — Care Management Important Message (Signed)
Important Message  Patient Details  Name: Anthony [name] MRN: [id number] Date of Birth: Nov 24, 1961   Medicare Important Message Given:  N/A - LOS <3 / Initial given by admissions    Joylene Draft, RN 12/18/2015, 2:31 PM

## 2015-12-18 NOTE — Care Management Note (Signed)
Case Management Note  Patient Details  Name: Anthony [name] MRN: [id number] Date of Birth: 10/14/1961  Subjective/Objective:                    Action/Plan:   Expected Discharge Date:  12/18/15               Expected Discharge Plan:  Home/Self Care  In-House Referral:  NA  Discharge planning Services  CM Consult  Post Acute Care Choice:  NA Choice offered to:  NA  DME Arranged:    DME Agency:     HH Arranged:    [place] Agency:     Status of Service:  Completed, signed off  Medicare Important Message Given:  N/A - LOS <3 / Initial given by admissions Date Medicare IM Given:    Medicare IM give by:    Date Additional Medicare IM Given:    Additional Medicare Important Message give by:     If discussed at [place] of Stay Meetings, dates discussed:    Additional Comments: Pt discharged home today. No CM needs noted. Christinia Gully McKee, RN 12/18/2015, 2:31 PM

## 2015-12-20 ENCOUNTER — Telehealth: Payer: Self-pay

## 2015-12-20 NOTE — Telephone Encounter (Signed)
Transition Care Management Follow-up Telephone Call  How have you been since you were released from the hospital? Not so good still in pain    Do you understand why you were in the hospital? Yes    Do you understand the discharge instrcutions? Yes   Items Reviewed:  Medications reviewed: Yes   Allergies reviewed: Yes   Dietary changes reviewed: Yes   Referrals reviewed:  Yes    Functional Questionnaire:   Activities of Daily Living (ADLs):   He states they are independent in the following: ambulation, bathing and hygiene, feeding, continence, grooming, toileting and dressing States they require assistance with the following: None    Any transportation issues/concerns?: no   Any patient concerns? No   Confirmed importance and date/time of follow-up visits scheduled: yes   Confirmed with patient if condition begins to worsen call PCP or go to the ER.  Patient was given the Call-a-Nurse line [phone]: yes   Pt has his hospital follow up on Thursday 12/26/15 at 10:00

## 2015-12-26 ENCOUNTER — Encounter: Payer: Self-pay | Admitting: Internal Medicine

## 2015-12-26 ENCOUNTER — Other Ambulatory Visit: Payer: Self-pay | Admitting: *Deleted

## 2015-12-26 ENCOUNTER — Ambulatory Visit (INDEPENDENT_AMBULATORY_CARE_PROVIDER_SITE_OTHER): Payer: Medicare Other | Admitting: Internal Medicine

## 2015-12-26 ENCOUNTER — Other Ambulatory Visit: Payer: Self-pay | Admitting: Internal Medicine

## 2015-12-26 VITALS — BP 140/82 | HR 86 | Temp 98.6°F | Resp 20 | Ht 68.0 in | Wt 190.0 lb

## 2015-12-26 DIAGNOSIS — E7800 Pure hypercholesterolemia, unspecified: Secondary | ICD-10-CM | POA: Diagnosis not present

## 2015-12-26 DIAGNOSIS — E119 Type 2 diabetes mellitus without complications: Secondary | ICD-10-CM | POA: Diagnosis not present

## 2015-12-26 DIAGNOSIS — I10 Essential (primary) hypertension: Secondary | ICD-10-CM | POA: Diagnosis not present

## 2015-12-26 DIAGNOSIS — L03032 Cellulitis of left toe: Secondary | ICD-10-CM | POA: Diagnosis not present

## 2015-12-26 MED ORDER — FLUTICASONE PROPIONATE 50 MCG/ACT NA SUSP: NASAL | Status: DC

## 2015-12-26 MED ORDER — AMOXICILLIN-POT CLAVULANATE 875-125 MG PO TABS: 1.0000 | ORAL_TABLET | Freq: Two times a day (BID) | ORAL | Status: DC

## 2015-12-26 NOTE — Progress Notes (Signed)
Pre visit review using our clinic review tool, if applicable. No additional management support is needed unless otherwise documented below in the visit note. 

## 2015-12-26 NOTE — Progress Notes (Signed)
Subjective:    Patient ID: Anthony [name], male    DOB: 03/05/1961, 55 y.o.   MRN: [id number]  HPI  55 year old patient has a history of essential hypertension and type 2 diabetes.  He was hospitalized recently after a puncture wound to the left plantar foot which resulted in cellulitis.  He responded well to 3 days of intravenous antibiotics and was discontinued on December 28, to complete 14 days of Bactrim DS.  Over the past few days she's had increasing redness and swelling involving his left third toe.  There is been no known trauma toe is slightly tender Denies any fever or chills Diabetes has been well controlled with last hemoglobin A1c nicely controlled  Hospital records reviewed  Lab Results  Component Value Date   HGBA1C 6.1* 12/16/2015    Past Medical History  Diagnosis Date  . Anxiety   . GERD (gastroesophageal reflux disease)   . Hyperlipidemia   . Diabetes mellitus   . ADD (attention deficit disorder with hyperactivity)   . Hx of colonic polyp - sessile serrated polyp 12/07/2014    Social History   Social History  . Marital Status: Single    Spouse Name: N/A  . Number of Children: N/A  . Years of Education: N/A   Occupational History  . Not on file.   Social History Main Topics  . Smoking status: Former Smoker    Quit date: 08/24/1996  . Smokeless tobacco: Never Used  . Alcohol Use: 0.0 oz/week    0 Standard drinks or equivalent per week     Comment: 3-40 oz a day of beer   . Drug Use: No  . Sexual Activity: Not on file   Other Topics Concern  . Not on file   Social History Narrative    Past Surgical History  Procedure Laterality Date  . Pilonidal cyst excision      Family History  Problem Relation Age of Onset  . Brain cancer Mother   . Diabetes Father   . Diabetes Sister   . Colon cancer Neg Hx   . Esophageal cancer Neg Hx   . Rectal cancer Neg Hx   . Stomach cancer Neg Hx   . Stroke Brother   . Seizures Brother     No Known  Allergies  Current Outpatient Prescriptions on File Prior to Visit  Medication Sig Dispense Refill  . ACCU-CHEK AVIVA PLUS test strip USE TO TEST BLOOD SUGAR DAILY. 100 each 6  . ACCU-CHEK SOFTCLIX LANCETS lancets USE TO TEST BLOOD SUGAR DAILY. 100 each 6  . acetaminophen (TYLENOL) 325 MG tablet Take 650 mg by mouth every 6 (six) hours as needed for moderate pain.    Marland Kitchen aspirin 81 MG tablet Take 81 mg by mouth daily.      Marland Kitchen atorvastatin (LIPITOR) 40 MG tablet Take 1 tablet (40 mg total) by mouth daily. 90 tablet 1  . fluticasone (FLONASE) 50 MCG/ACT nasal spray Place 2 sprays into both nostrils daily. 16 g 5  . glipiZIDE (GLUCOTROL XL) 5 MG 24 hr tablet Take 1 tablet (5 mg total) by mouth daily. 90 tablet 1  . hydrochlorothiazide (MICROZIDE) 12.5 MG capsule Take 1 capsule by mouth daily.    Marland Kitchen labetalol (NORMODYNE) 300 MG tablet Take 1 tablet (300 mg total) by mouth 2 (two) times daily. 180 tablet 5  . lisinopril (PRINIVIL,ZESTRIL) 40 MG tablet TAKE ONE (1) TABLET EACH DAY 90 tablet 1  . metFORMIN (GLUCOPHAGE) 1000 MG tablet TAKE  ONE TABLET TWICE DAILY WITH MEALS 180 tablet 1  . Multiple Vitamin (MULTIVITAMIN WITH MINERALS) TABS tablet Take 1 tablet by mouth daily.    Marland Kitchen sulfamethoxazole-trimethoprim (BACTRIM DS) 800-160 MG tablet Take 1 tablet by mouth 2 (two) times daily. 28 tablet 0   No current facility-administered medications on file prior to visit.    BP 140/82 mmHg  Pulse 86  Temp(Src) 98.6 F (37 C) (Oral)  Resp 20  Ht 5\' 8"  (1.727 m)  Wt 190 lb (86.183 kg)  BMI 28.90 kg/m2  SpO2 98%          Review of Systems  Constitutional: Negative for fever, chills, appetite change and fatigue.  HENT: Negative for congestion, dental problem, ear pain, hearing loss, sore throat, tinnitus, trouble swallowing and voice change.   Eyes: Negative for pain, discharge and visual disturbance.  Respiratory: Negative for cough, chest tightness, wheezing and stridor.   Cardiovascular:  Negative for chest pain, palpitations and leg swelling.  Gastrointestinal: Negative for nausea, vomiting, abdominal pain, diarrhea, constipation, blood in stool and abdominal distention.  Genitourinary: Negative for urgency, hematuria, flank pain, discharge, difficulty urinating and genital sores.  Musculoskeletal: Negative for myalgias, back pain, joint swelling, arthralgias, gait problem and neck stiffness.  Skin: Positive for wound. Negative for rash.       Swelling, pain and redness involving his left third toe  Neurological: Negative for dizziness, syncope, speech difficulty, weakness, numbness and headaches.  Hematological: Negative for adenopathy. Does not bruise/bleed easily.  Psychiatric/Behavioral: Negative for behavioral problems and dysphoric mood. The patient is not nervous/anxious.        Objective:   Physical Exam  Constitutional: He appears well-developed and well-nourished. No distress.  Temperature 98.6 No tachycardia No distress  Skin:   The site of the puncture wound over the left first MTP without inflammatory changes  The left third toe was erythematous, slightly tender and edematous and warm to touch          Assessment & Plan:    Cellulitis left third toe Status post puncture wound left foot Diabetes mellitus.  Well-controlled Essential hypertension  We'll continue Bactrim DS.  Will add Augmentin for 7 days  Return in 3 months for follow-up

## 2015-12-26 NOTE — Patient Instructions (Addendum)
Take your antibiotic as prescribed until ALL of it is gone, but stop if you develop a rash, swelling, or any side effects of the medication.  Contact our office as soon as possible if  there are side effects of the medication.   Please check your hemoglobin A1c every 3 months  Cellulitis Cellulitis is an infection of the skin and the tissue under the skin. The infected area is usually red and tender. This happens most often in the arms and lower legs. HOME CARE   Take your antibiotic medicine as told. Finish the medicine even if you start to feel better.  Keep the infected arm or leg raised (elevated).  Put a warm cloth on the area up to 4 times per day.  Only take medicines as told by your doctor.  Keep all doctor visits as told. GET HELP IF:  You see red streaks on the skin coming from the infected area.  Your red area gets bigger or turns a dark color.  Your bone or joint under the infected area is painful after the skin heals.  Your infection comes back in the same area or different area.  You have a puffy (swollen) bump in the infected area.  You have new symptoms.  You have a fever. GET HELP RIGHT AWAY IF:   You feel very sleepy.  You throw up (vomit) or have watery poop (diarrhea).  You feel sick and have muscle aches and pains.   This information is not intended to replace advice given to you by your health care provider. Make sure you discuss any questions you have with your health care provider.   Document Released: 05/25/2008 Document Revised: 08/28/2015 Document Reviewed: 02/22/2012 Elsevier Interactive Patient Education [employer].

## 2016-01-22 ENCOUNTER — Ambulatory Visit (INDEPENDENT_AMBULATORY_CARE_PROVIDER_SITE_OTHER): Payer: Medicare Other | Admitting: Internal Medicine

## 2016-01-22 ENCOUNTER — Encounter: Payer: Self-pay | Admitting: Internal Medicine

## 2016-01-22 VITALS — BP 142/80 | HR 75 | Temp 98.4°F | Resp 20 | Ht 68.0 in | Wt 191.0 lb

## 2016-01-22 DIAGNOSIS — M722 Plantar fascial fibromatosis: Secondary | ICD-10-CM | POA: Diagnosis not present

## 2016-01-22 DIAGNOSIS — E119 Type 2 diabetes mellitus without complications: Secondary | ICD-10-CM | POA: Diagnosis not present

## 2016-01-22 DIAGNOSIS — L02619 Cutaneous abscess of unspecified foot: Secondary | ICD-10-CM

## 2016-01-22 DIAGNOSIS — L03119 Cellulitis of unspecified part of limb: Secondary | ICD-10-CM | POA: Diagnosis not present

## 2016-01-22 DIAGNOSIS — I10 Essential (primary) hypertension: Secondary | ICD-10-CM

## 2016-01-22 NOTE — Progress Notes (Signed)
Pre visit review using our clinic review tool, if applicable. No additional management support is needed unless otherwise documented below in the visit note. 

## 2016-01-22 NOTE — Progress Notes (Signed)
Subjective:    Patient ID: Anthony [name], male    DOB: [birth year], 55 y.o.   MRN: [id number]  HPI Admit date: 12/15/2015 Discharge date: 12/18/2015  Recommendations for Outpatient Follow-up:  -Will be discharged home today. -Advised to follow-up with primary care provider in 2 weeks for follow-up of his left foot cellulitis.  Discharge Diagnoses:  Principal Problem:  Cellulitis and abscess of foot Active Problems:  Essential hypertension  Diabetes mellitus without complication ([place])  Puncture wound of foot   Lab Results  Component Value Date   HGBA1C 6.1* 12/16/2015   55 year old patient who is seen following a hospital discharge in December.  The puncture wound and cellulitis involving left foot has total resolved.  He does complain of some left heel pain for the past few weeks with ambulation. He has type 2 diabetes which has been stable.  Hemoglobin A1c was 6.1 in December No new concerns or complaints He does have a history of dyslipidemia and essential hypertension. No hypoglycemia  Past Medical History  Diagnosis Date  . Anxiety   . GERD (gastroesophageal reflux disease)   . Hyperlipidemia   . Diabetes mellitus   . ADD (attention deficit disorder with hyperactivity)   . Hx of colonic polyp - sessile serrated polyp 12/07/2014    Social History   Social History  . Marital Status: Single    Spouse Name: N/A  . Number of Children: N/A  . Years of Education: N/A   Occupational History  . Not on file.   Social History Main Topics  . Smoking status: Former Smoker    Quit date: 08/24/1996  . Smokeless tobacco: Never Used  . Alcohol Use: 0.0 oz/week    0 Standard drinks or equivalent per week     Comment: 3-40 oz a day of beer   . Drug Use: No  . Sexual Activity: Not on file   Other Topics Concern  . Not on file   Social History Narrative    Past Surgical History  Procedure Laterality Date  . Pilonidal cyst excision      Family History    Problem Relation Age of Onset  . Brain cancer Mother   . Diabetes Father   . Diabetes Sister   . Colon cancer Neg Hx   . Esophageal cancer Neg Hx   . Rectal cancer Neg Hx   . Stomach cancer Neg Hx   . Stroke Brother   . Seizures Brother     No Known Allergies  Current Outpatient Prescriptions on File Prior to Visit  Medication Sig Dispense Refill  . ACCU-CHEK AVIVA PLUS test strip USE TO TEST BLOOD SUGAR DAILY. 100 each 6  . ACCU-CHEK SOFTCLIX LANCETS lancets USE TO TEST BLOOD SUGAR DAILY. 100 each 6  . acetaminophen (TYLENOL) 325 MG tablet Take 650 mg by mouth every 6 (six) hours as needed for moderate pain.    Marland Kitchen amoxicillin-clavulanate (AUGMENTIN) 875-125 MG tablet Take 1 tablet by mouth 2 (two) times daily. 14 tablet 0  . aspirin 81 MG tablet Take 81 mg by mouth daily.      Marland Kitchen atorvastatin (LIPITOR) 40 MG tablet Take 1 tablet (40 mg total) by mouth daily. 90 tablet 1  . fluticasone (FLONASE) 50 MCG/ACT nasal spray USE TWO SPRAYS IN BOTH NOSTRIL DAILY 48 g 1  . glipiZIDE (GLUCOTROL XL) 5 MG 24 hr tablet Take 1 tablet (5 mg total) by mouth daily. 90 tablet 1  . hydrochlorothiazide (MICROZIDE) 12.5 MG capsule Take  1 capsule by mouth daily.    Marland Kitchen labetalol (NORMODYNE) 300 MG tablet Take 1 tablet (300 mg total) by mouth 2 (two) times daily. 180 tablet 5  . lisinopril (PRINIVIL,ZESTRIL) 40 MG tablet TAKE ONE (1) TABLET EACH DAY 90 tablet 1  . metFORMIN (GLUCOPHAGE) 1000 MG tablet TAKE ONE TABLET TWICE DAILY WITH MEALS 180 tablet 1  . Multiple Vitamin (MULTIVITAMIN WITH MINERALS) TABS tablet Take 1 tablet by mouth daily.    Marland Kitchen sulfamethoxazole-trimethoprim (BACTRIM DS) 800-160 MG tablet Take 1 tablet by mouth 2 (two) times daily. 28 tablet 0   No current facility-administered medications on file prior to visit.    BP 142/80 mmHg  Pulse 75  Temp(Src) 98.4 F (36.9 C) (Oral)  Resp 20  Ht 5\' 8"  (1.727 m)  Wt 191 lb (86.637 kg)  BMI 29.05 kg/m2  SpO2 99%     Review of Systems   Constitutional: Negative for fever, chills, appetite change and fatigue.  HENT: Negative for congestion, dental problem, ear pain, hearing loss, sore throat, tinnitus, trouble swallowing and voice change.   Eyes: Negative for pain, discharge and visual disturbance.  Respiratory: Negative for cough, chest tightness, wheezing and stridor.   Cardiovascular: Negative for chest pain, palpitations and leg swelling.  Gastrointestinal: Negative for nausea, vomiting, abdominal pain, diarrhea, constipation, blood in stool and abdominal distention.  Genitourinary: Negative for urgency, hematuria, flank pain, discharge, difficulty urinating and genital sores.  Musculoskeletal: Negative for myalgias, back pain, joint swelling, arthralgias, gait problem and neck stiffness.       Left heel pain  Skin: Negative for rash.  Neurological: Negative for dizziness, syncope, speech difficulty, weakness, numbness and headaches.  Hematological: Negative for adenopathy. Does not bruise/bleed easily.  Psychiatric/Behavioral: Negative for behavioral problems and dysphoric mood. The patient is not nervous/anxious.        Objective:   Physical Exam  Constitutional: He is oriented to person, place, and time. He appears well-developed.  Blood pressure 130/80  HENT:  Head: Normocephalic.  Right Ear: External ear normal.  Left Ear: External ear normal.  Eyes: Conjunctivae and EOM are normal.  Neck: Normal range of motion.  Cardiovascular: Normal rate and normal heart sounds.   Pulmonary/Chest: Breath sounds normal.  Abdominal: Bowel sounds are normal.  Musculoskeletal: Normal range of motion. He exhibits no edema or tenderness.  Left heel appeared to be normal.  No inflammatory changes.  No tenderness to palpation  Neurological: He is alert and oriented to person, place, and time.  Skin:  Puncture wound over the plantar surface near the left first MTP joint was normal  Psychiatric: He has a normal mood and affect.  His behavior is normal.          Assessment & Plan:   Status post left foot cellulitis.  Resolved Diabetes mellitus.  Well-controlled Essential hypertension, stable Plantar fasciitis, left foot Dyslipidemia.  Continue statin therapy   Recheck 4 months

## 2016-01-22 NOTE — Patient Instructions (Signed)

## 2016-01-30 DIAGNOSIS — F10239 Alcohol dependence with withdrawal, unspecified: Secondary | ICD-10-CM | POA: Insufficient documentation

## 2016-02-03 ENCOUNTER — Ambulatory Visit (INDEPENDENT_AMBULATORY_CARE_PROVIDER_SITE_OTHER): Payer: Medicare Other | Admitting: Internal Medicine

## 2016-02-03 ENCOUNTER — Encounter: Payer: Self-pay | Admitting: Internal Medicine

## 2016-02-03 VITALS — BP 110/68 | HR 88 | Temp 98.5°F | Resp 20 | Ht 68.0 in | Wt 194.0 lb

## 2016-02-03 DIAGNOSIS — I10 Essential (primary) hypertension: Secondary | ICD-10-CM | POA: Diagnosis not present

## 2016-02-03 DIAGNOSIS — E119 Type 2 diabetes mellitus without complications: Secondary | ICD-10-CM

## 2016-02-03 DIAGNOSIS — F1020 Alcohol dependence, uncomplicated: Secondary | ICD-10-CM | POA: Diagnosis not present

## 2016-02-03 NOTE — Patient Instructions (Addendum)
Please follow through with inpatient rehabilitation  Return here in 2 months for follow-up   Please check your hemoglobin A1c every 3 months  Limit your sodium (Salt) intake  Alcohol Use Disorder Alcohol use disorder is a mental disorder. It is not a one-time incident of heavy drinking. Alcohol use disorder is the excessive and uncontrollable use of alcohol over time that leads to problems with functioning in one or more areas of daily living. People with this disorder risk harming themselves and others when they drink to excess. Alcohol use disorder also can cause other mental disorders, such as mood and anxiety disorders, and serious physical problems. People with alcohol use disorder often misuse other drugs.  Alcohol use disorder is common and widespread. Some people with this disorder drink alcohol to cope with or escape from negative life events. Others drink to relieve chronic pain or symptoms of mental illness. People with a family history of alcohol use disorder are at higher risk of losing control and using alcohol to excess.  Drinking too much alcohol can cause injury, accidents, and health problems. One drink can be too much when you are:  Working.  Pregnant or breastfeeding.  Taking medicines. Ask your doctor.  Driving or planning to drive. SYMPTOMS  Signs and symptoms of alcohol use disorder may include the following:   Consumption ofalcohol inlarger amounts or over a longer period of time than intended.  Multiple unsuccessful attempts to cutdown or control alcohol use.   A great deal of time spent obtaining alcohol, using alcohol, or recovering from the effects of alcohol (hangover).  A strong desire or urge to use alcohol (cravings).   Continued use of alcohol despite problems at work, school, or home because of alcohol use.   Continued use of alcohol despite problems in relationships because of alcohol use.  Continued use of alcohol in situations when it is  physically hazardous, such as driving a car.  Continued use of alcohol despite awareness of a physical or psychological problem that is likely related to alcohol use. Physical problems related to alcohol use can involve the brain, heart, liver, stomach, and intestines. Psychological problems related to alcohol use include intoxication, depression, anxiety, psychosis, delirium, and dementia.   The need for increased amounts of alcohol to achieve the same desired effect, or a decreased effect from the consumption of the same amount of alcohol (tolerance).  Withdrawal symptoms upon reducing or stopping alcohol use, or alcohol use to reduce or avoid withdrawal symptoms. Withdrawal symptoms include:  Racing heart.  Hand tremor.  Difficulty sleeping.  Nausea.  Vomiting.  Hallucinations.  Restlessness.  Seizures. DIAGNOSIS Alcohol use disorder is diagnosed through an assessment by your health care provider. Your health care provider may start by asking three or four questions to screen for excessive or problematic alcohol use. To confirm a diagnosis of alcohol use disorder, at least two symptoms must be present within a 29-month period. The severity of alcohol use disorder depends on the number of symptoms:  Mild--two or three.  Moderate--four or five.  Severe--six or more. Your health care provider may perform a physical exam or use results from lab tests to see if you have physical problems resulting from alcohol use. Your health care provider may refer you to a mental health professional for evaluation. TREATMENT  Some people with alcohol use disorder are able to reduce their alcohol use to low-risk levels. Some people with alcohol use disorder need to quit drinking alcohol. When necessary, mental health professionals  with specialized training in substance use treatment can help. Your health care provider can help you decide how severe your alcohol use disorder is and what type of  treatment you need. The following forms of treatment are available:   Detoxification. Detoxification involves the use of prescription medicines to prevent alcohol withdrawal symptoms in the first week after quitting. This is important for people with a history of symptoms of withdrawal and for heavy drinkers who are likely to have withdrawal symptoms. Alcohol withdrawal can be dangerous and, in severe cases, cause death. Detoxification is usually provided in a hospital or in-patient substance use treatment facility.  Counseling or talk therapy. Talk therapy is provided by substance use treatment counselors. It addresses the reasons people use alcohol and ways to keep them from drinking again. The goals of talk therapy are to help people with alcohol use disorder find healthy activities and ways to cope with life stress, to identify and avoid triggers for alcohol use, and to handle cravings, which can cause relapse.  Medicines.Different medicines can help treat alcohol use disorder through the following actions:  Decrease alcohol cravings.  Decrease the positive reward response felt from alcohol use.  Produce an uncomfortable physical reaction when alcohol is used (aversion therapy).  Support groups. Support groups are run by people who have quit drinking. They provide emotional support, advice, and guidance. These forms of treatment are often combined. Some people with alcohol use disorder benefit from intensive combination treatment provided by specialized substance use treatment centers. Both inpatient and outpatient treatment programs are available.   This information is not intended to replace advice given to you by your health care provider. Make sure you discuss any questions you have with your health care provider.   Document Released: 01/14/2005 Document Revised: 12/28/2014 Document Reviewed: 03/16/2013 Elsevier Interactive Patient Education [employer].

## 2016-02-03 NOTE — Progress Notes (Signed)
Pre visit review using our clinic review tool, if applicable. No additional management support is needed unless otherwise documented below in the visit note. 

## 2016-02-03 NOTE — Progress Notes (Signed)
Subjective:    Patient ID: Anthony [name], male    DOB: [birth year], 55 y.o.   MRN: [id number]  HPI  55 year old patient who has a history of type 2 diabetes. He has a history of alcoholism and was discharged from [facility] for detoxification yesterday.  He was hospitalized for 3 days and discharged on Celexa and gabapentin.  He feels well.  His only complaints are left-sided epistaxis and right lateral ankle pain.  Both of one days duration  Lab Results  Component Value Date   HGBA1C 6.1* 12/16/2015    He was given contact information yesterday after his discharge from [facility] for ongoing treatment.  He is interested in considering inpatient rehabilitation  Past Medical History  Diagnosis Date  . Anxiety   . GERD (gastroesophageal reflux disease)   . Hyperlipidemia   . Diabetes mellitus   . ADD (attention deficit disorder with hyperactivity)   . Hx of colonic polyp - sessile serrated polyp 12/07/2014    Social History   Social History  . Marital Status: Single    Spouse Name: N/A  . Number of Children: N/A  . Years of Education: N/A   Occupational History  . Not on file.   Social History Main Topics  . Smoking status: Former Smoker    Quit date: 08/24/1996  . Smokeless tobacco: Never Used  . Alcohol Use: 0.0 oz/week    0 Standard drinks or equivalent per week     Comment: 3-40 oz a day of beer   . Drug Use: No  . Sexual Activity: Not on file   Other Topics Concern  . Not on file   Social History Narrative    Past Surgical History  Procedure Laterality Date  . Pilonidal cyst excision      Family History  Problem Relation Age of Onset  . Brain cancer Mother   . Diabetes Father   . Diabetes Sister   . Colon cancer Neg Hx   . Esophageal cancer Neg Hx   . Rectal cancer Neg Hx   . Stomach cancer Neg Hx   . Stroke Brother   . Seizures Brother     No Known Allergies  Current Outpatient Prescriptions on File  Prior to Visit  Medication Sig Dispense Refill  . ACCU-CHEK AVIVA PLUS test strip USE TO TEST BLOOD SUGAR DAILY. 100 each 6  . ACCU-CHEK SOFTCLIX LANCETS lancets USE TO TEST BLOOD SUGAR DAILY. 100 each 6  . acetaminophen (TYLENOL) 325 MG tablet Take 650 mg by mouth every 6 (six) hours as needed for moderate pain.    Marland Kitchen amoxicillin-clavulanate (AUGMENTIN) 875-125 MG tablet Take 1 tablet by mouth 2 (two) times daily. 14 tablet 0  . aspirin 81 MG tablet Take 81 mg by mouth daily.      Marland Kitchen atorvastatin (LIPITOR) 40 MG tablet Take 1 tablet (40 mg total) by mouth daily. 90 tablet 1  . fluticasone (FLONASE) 50 MCG/ACT nasal spray USE TWO SPRAYS IN BOTH NOSTRIL DAILY 48 g 1  . glipiZIDE (GLUCOTROL XL) 5 MG 24 hr tablet Take 1 tablet (5 mg total) by mouth daily. 90 tablet 1  . hydrochlorothiazide (MICROZIDE) 12.5 MG capsule Take 1 capsule by mouth daily.    Marland Kitchen labetalol (NORMODYNE) 300 MG tablet Take 1 tablet (300 mg total) by mouth 2 (two) times daily. 180 tablet 5  . lisinopril (PRINIVIL,ZESTRIL) 40 MG tablet TAKE ONE (1) TABLET EACH DAY 90 tablet 1  .  metFORMIN (GLUCOPHAGE) 1000 MG tablet TAKE ONE TABLET TWICE DAILY WITH MEALS 180 tablet 1  . Multiple Vitamin (MULTIVITAMIN WITH MINERALS) TABS tablet Take 1 tablet by mouth daily.     No current facility-administered medications on file prior to visit.    BP 110/68 mmHg  Pulse 88  Temp(Src) 98.5 F (36.9 C) (Oral)  Resp 20  Ht 5\' 8"  (1.727 m)  Wt 194 lb (87.998 kg)  BMI 29.50 kg/m2  SpO2 98%     Review of Systems  Constitutional: Negative for fever, chills, appetite change and fatigue.  HENT: Positive for nosebleeds. Negative for congestion, dental problem, ear pain, hearing loss, sore throat, tinnitus, trouble swallowing and voice change.   Eyes: Negative for pain, discharge and visual disturbance.  Respiratory: Negative for cough, chest tightness, wheezing and stridor.   Cardiovascular: Negative for chest pain, palpitations and leg  swelling.  Gastrointestinal: Negative for nausea, vomiting, abdominal pain, diarrhea, constipation, blood in stool and abdominal distention.  Genitourinary: Negative for urgency, hematuria, flank pain, discharge, difficulty urinating and genital sores.  Musculoskeletal: Positive for joint swelling and arthralgias. Negative for myalgias, back pain, gait problem and neck stiffness.  Skin: Negative for rash.  Neurological: Negative for dizziness, syncope, speech difficulty, weakness, numbness and headaches.  Hematological: Negative for adenopathy. Does not bruise/bleed easily.  Psychiatric/Behavioral: Negative for behavioral problems and dysphoric mood. The patient is not nervous/anxious.        Objective:   Physical Exam  Constitutional: He is oriented to person, place, and time. He appears well-developed.  HENT:  Head: Normocephalic.  Right Ear: External ear normal.  Left Ear: External ear normal.  Small superficial ulceration with crusting left  mid septal area  Eyes: Conjunctivae and EOM are normal.  Neck: Normal range of motion.  Cardiovascular: Normal rate and normal heart sounds.   Pedal pulses full  Pulmonary/Chest: Breath sounds normal.  Abdominal: Bowel sounds are normal.  Musculoskeletal: Normal range of motion. He exhibits no edema or tenderness.  Soft tissue swelling right lateral ankle  Neurological: He is alert and oriented to person, place, and time.  Psychiatric: He has a normal mood and affect. His behavior is normal.          Assessment & Plan:   Alcoholism.  Status post recent detoxification.  Patient encouraged to follow through with inpatient rehabilitation.  He has been given contact information Diabetes mellitus stable.  Recheck hemoglobin A1c in 2 months Essential hypertension, stable Mild epistaxis  Recheck 2 months

## 2016-02-06 ENCOUNTER — Telehealth: Payer: Self-pay | Admitting: Internal Medicine

## 2016-02-06 NOTE — Telephone Encounter (Signed)
Patient Name: LUKEN PHENIS  DOB: 1961-03-23    Initial Comment Caller states her son-in-law is on medication for alcohol abuse, and he's wanting to sleep to much.    Nurse Assessment  Nurse: Mallie Mussel, RN, Alveta Heimlich Date/Time Eilene Ghazi Time): 02/06/2016 1:57:46 PM  Confirm and document reason for call. If symptomatic, describe symptoms. You must click the next button to save text entered. ---Caller states that she is concerned about her son-in-law. He came out from the hospital from detox with 2 new medications. He is on Celexa and Neurontin. All he is doing is sleeping when he takes these meds. He is a diabetic. Per Drugs.com's site, Celexa does have a side effect of drowsiness. I did not see that listed on the Nuerontin. She wants to know what she needs to do about his medications. She states that there is a permission form signed at the office that allows her to talk to [place] about his condition. I advised her that the doctor would have to address this with her. I will send a note to the office for someone to call her back. She verbalized understanding https://www.drugs.com/sfx/celexa-side-effects.html https://www.drugs.com/sfx/neurontin-side-effects.html  Has the patient traveled out of the country within the last 30 days? ---Not Applicable  Does the patient have any new or worsening symptoms? ---Yes  Will a triage be completed? ---No  Select reason for no triage. ---Other     Guidelines    Guideline Title Affirmed Question Affirmed Notes       Final Disposition User        Comments  Caller wanted to give an extra number in case she couldn't be reached. [phone].

## 2016-02-06 NOTE — Telephone Encounter (Signed)
Spoke to [place], she said pt is very sleepy on the medication Gabapentin he is taking 300 mg 4 times a day and she is concerned due to all he is doing is sleeping. Told her [name] is gone for the day but for now have pt not take tonight doses. I will discuss with [name] in the morning and get back to you. Inez Catalina verbalized understanding and said she will tell pt.   Discussed with Dr. Yong Channel and he said okay to hold doses till I discuss with [name] tomorrow.

## 2016-02-06 NOTE — Telephone Encounter (Signed)
Dr. K, please see message and advise. 

## 2016-02-07 NOTE — Telephone Encounter (Signed)
Spoke to [place], told her to have pt decrease Gabapentin to 1 at bedtime only. Inez Catalina verbalized understanding and will let pt know. Inez Catalina concerned about pt's alcohol abuse and needs to get him in a facility. She would like a referral for pt to go to either [facility] or [facility] facilities. Told her I will discuss with Dr. Raliegh Ip and find out if he can do referral and get back to you. Inez Catalina verbalized understanding.

## 2016-02-07 NOTE — Telephone Encounter (Signed)
Decrease gabapentin to 1 at bedtime only

## 2016-02-07 NOTE — Telephone Encounter (Signed)
Discussed pt with [name] in regards to referral to a Treatment  facility for alcohol abuse. Told him I checked with [facility] and [facility] and we can not do referrals to facilities of that nature. Dr Raliegh Ip said they were given all the information when he was in the hospital. Anthony [name] will need to contact them herself if she wants him to go to one. Told him okay will let her know.

## 2016-02-07 NOTE — Telephone Encounter (Signed)
Called Anthony [name] and left message on voicemail to call office.

## 2016-02-10 NOTE — Telephone Encounter (Signed)
Spoke to [name], told her discussed with Dr. Raliegh Ip and office does not do referrals you will need to contact the places yourself and the information was given to him when he was in the hospital. Anthony [name] verbalized understanding and has already taken care of it pt is out [place]. Told her okay, good I will let [name] know. Anthony [name] verbalized understanding. Dr. Raliegh Ip notified.

## 2016-03-05 ENCOUNTER — Other Ambulatory Visit: Payer: Self-pay | Admitting: Internal Medicine

## 2016-03-20 ENCOUNTER — Ambulatory Visit (INDEPENDENT_AMBULATORY_CARE_PROVIDER_SITE_OTHER): Payer: Medicare Other | Admitting: Internal Medicine

## 2016-03-20 ENCOUNTER — Encounter: Payer: Self-pay | Admitting: Internal Medicine

## 2016-03-20 ENCOUNTER — Telehealth: Payer: Self-pay | Admitting: Internal Medicine

## 2016-03-20 VITALS — BP 150/90 | HR 112 | Temp 98.5°F | Resp 20 | Ht 68.0 in | Wt 192.0 lb

## 2016-03-20 DIAGNOSIS — E538 Deficiency of other specified B group vitamins: Secondary | ICD-10-CM

## 2016-03-20 DIAGNOSIS — I10 Essential (primary) hypertension: Secondary | ICD-10-CM | POA: Diagnosis not present

## 2016-03-20 DIAGNOSIS — E119 Type 2 diabetes mellitus without complications: Secondary | ICD-10-CM | POA: Diagnosis not present

## 2016-03-20 DIAGNOSIS — E7800 Pure hypercholesterolemia, unspecified: Secondary | ICD-10-CM | POA: Diagnosis not present

## 2016-03-20 LAB — CBC WITH DIFFERENTIAL/PLATELET
BASOS ABS: 0 10*3/uL (ref 0.0–0.1)
Basophils Relative: 0.2 % (ref 0.0–3.0)
Eosinophils Absolute: 0 10*3/uL (ref 0.0–0.7)
Eosinophils Relative: 0.3 % (ref 0.0–5.0)
HCT: 38.6 % — ABNORMAL LOW (ref 39.0–52.0)
HEMOGLOBIN: 13.1 g/dL (ref 13.0–17.0)
LYMPHS ABS: 2 10*3/uL (ref 0.7–4.0)
LYMPHS PCT: 19.3 % (ref 12.0–46.0)
MCHC: 34.1 g/dL (ref 30.0–36.0)
MCV: 89.1 fl (ref 78.0–100.0)
MONOS PCT: 5.8 % (ref 3.0–12.0)
Monocytes Absolute: 0.6 10*3/uL (ref 0.1–1.0)
NEUTROS PCT: 74.4 % (ref 43.0–77.0)
Neutro Abs: 7.6 10*3/uL (ref 1.4–7.7)
Platelets: 196 10*3/uL (ref 150.0–400.0)
RBC: 4.33 Mil/uL (ref 4.22–5.81)
RDW: 13.9 % (ref 11.5–15.5)
WBC: 10.2 10*3/uL (ref 4.0–10.5)

## 2016-03-20 LAB — HEMOGLOBIN A1C: HEMOGLOBIN A1C: 6.6 % — AB (ref 4.6–6.5)

## 2016-03-20 NOTE — Progress Notes (Signed)
Subjective:    Patient ID: Anthony [name], male    DOB: [birth year], 55 y.o.   MRN: [id number]  HPI  55 year old patient who is seen following detoxification and rehabilitation.  He was discharged about 2 weeks ago from [facility] in [place], [place].  He is accompanied by his significant other. 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  He has not followed up with AA. He was diagnosed with the B12 deficiency during his hospital stay He has returned to work.  He has a history of hospital admission in December for cellulitis of the left foot with abscess formation  Past Medical History  Diagnosis Date  . Anxiety   . GERD (gastroesophageal reflux disease)   . Hyperlipidemia   . Diabetes mellitus   . ADD (attention deficit disorder with hyperactivity)   . Hx of colonic polyp - sessile serrated polyp 12/07/2014    Social History   Social History  . Marital Status: Single    Spouse Name: N/A  . Number of Children: N/A  . Years of Education: N/A   Occupational History  . Not on file.   Social History Main Topics  . Smoking status: Former Smoker    Quit date: 08/24/1996  . Smokeless tobacco: Never Used  . Alcohol Use: 0.0 oz/week    0 Standard drinks or equivalent per week     Comment: 3-40 oz a day of beer   . Drug Use: No  . Sexual Activity: Not on file   Other Topics Concern  . Not on file   Social History Narrative    Past Surgical History  Procedure Laterality Date  . Pilonidal cyst excision      Family History  Problem Relation Age of Onset  . Brain cancer Mother   . Diabetes Father   . Diabetes Sister   . Colon cancer Neg Hx   . Esophageal cancer Neg Hx   . Rectal cancer Neg Hx   . Stomach cancer Neg Hx   . Stroke Brother   . Seizures Brother     No Known Allergies  Current Outpatient Prescriptions on File Prior to Visit  Medication Sig Dispense Refill  . ACCU-CHEK AVIVA PLUS test strip  USE TO TEST BLOOD SUGAR DAILY. 100 each 6  . ACCU-CHEK SOFTCLIX LANCETS lancets USE TO TEST BLOOD SUGAR DAILY. 100 each 6  . acetaminophen (TYLENOL) 325 MG tablet Take 650 mg by mouth every 6 (six) hours as needed for moderate pain.    Marland Kitchen aspirin 81 MG tablet Take 81 mg by mouth daily.      Marland Kitchen atorvastatin (LIPITOR) 40 MG tablet TAKE ONE (1) TABLET EACH DAY 90 tablet 1  . citalopram (CELEXA) 10 MG tablet Take 10 mg by mouth daily.    . fluticasone (FLONASE) 50 MCG/ACT nasal spray USE TWO SPRAYS IN BOTH NOSTRIL DAILY 48 g 1  . gabapentin (NEURONTIN) 300 MG capsule Take 300 mg by mouth at bedtime.     Marland Kitchen glipiZIDE (GLUCOTROL XL) 5 MG 24 hr tablet TAKE ONE (1) TABLET EACH DAY 90 tablet 1  . hydrochlorothiazide (MICROZIDE) 12.5 MG capsule Take 1 capsule by mouth daily.    Marland Kitchen labetalol (NORMODYNE) 300 MG tablet Take 1 tablet (300 mg total) by mouth 2 (two) times daily. 180 tablet 5  . lisinopril (PRINIVIL,ZESTRIL) 40 MG tablet TAKE ONE (1) TABLET EACH DAY 90 tablet 1  . metFORMIN (GLUCOPHAGE) 1000 MG tablet TAKE  ONE TABLET TWICE DAILY WITH MEALS 180 tablet 1  . Multiple Vitamin (MULTIVITAMIN WITH MINERALS) TABS tablet Take 1 tablet by mouth daily.     No current facility-administered medications on file prior to visit.    BP 150/90 mmHg  Pulse 112  Temp(Src) 98.5 F (36.9 C) (Oral)  Resp 20  Ht 5\' 8"  (1.727 m)  Wt 192 lb (87.091 kg)  BMI 29.20 kg/m2  SpO2 98%      Review of Systems  Constitutional: Negative for fever, chills, appetite change and fatigue.  HENT: Negative for congestion, dental problem, ear pain, hearing loss, sore throat, tinnitus, trouble swallowing and voice change.   Eyes: Negative for pain, discharge and visual disturbance.  Respiratory: Negative for cough, chest tightness, wheezing and stridor.   Cardiovascular: Negative for chest pain, palpitations and leg swelling.  Gastrointestinal: Negative for nausea, vomiting, abdominal pain, diarrhea, constipation, blood in  stool and abdominal distention.  Genitourinary: Negative for urgency, hematuria, flank pain, discharge, difficulty urinating and genital sores.  Musculoskeletal: Negative for myalgias, back pain, joint swelling, arthralgias, gait problem and neck stiffness.  Skin: Negative for rash.  Neurological: Negative for dizziness, syncope, speech difficulty, weakness, numbness and headaches.  Hematological: Negative for adenopathy. Does not bruise/bleed easily.  Psychiatric/Behavioral: Negative for behavioral problems and dysphoric mood. The patient is not nervous/anxious.        Objective:   Physical Exam  Constitutional: He is oriented to person, place, and time. He appears well-developed.  140 over 10  HENT:  Head: Normocephalic.  Right Ear: External ear normal.  Left Ear: External ear normal.  Eyes: Conjunctivae and EOM are normal.  Neck: Normal range of motion.  Cardiovascular: Normal rate, normal heart sounds and intact distal pulses.   Pulmonary/Chest: Breath sounds normal.  Abdominal: Bowel sounds are normal.  Musculoskeletal: Normal range of motion. He exhibits no edema or tenderness.  Neurological: He is alert and oriented to person, place, and time.  Diabetic foot exam performed Pedal pulses full Sensory exam normal No visual abnormalities except for a callus involving the left lateral foot near the fifth MTP joint  Skin: He is diaphoretic.  Superficial callus, left lateral foot near the fifth MTP joint  Psychiatric: He has a normal mood and affect. His behavior is normal.          Assessment & Plan:   Diabetes mellitus.  Currently good control.  Will recheck a hemoglobin A1c alcoholism Essential hypertension, stable Anxiety disorder History of dyslipidemia.  Continue statin therapy Chronic alcoholism.  Relapsed.  Follow-up AA.  Encouraged

## 2016-03-20 NOTE — Telephone Encounter (Signed)
In order for pt to get DM shoes, pt needs to have had a foot exam, with documention. Pt told apothecary he had foot exam today, Notes must state what ever the doctor found problematic, and why, then they will need form filled out for DM shoes. They need Rx for the DM shoes to move forward.  Also must state pt has neuropathy in order for pt to qualify.  Fax [phone]

## 2016-03-20 NOTE — Patient Instructions (Addendum)
Please check your hemoglobin A1c every 3 months  Discontinue all alcohol products  Limit your sodium (Salt) intake  Please check your blood pressure on a regular basis.  If it is consistently greater than 150/90, please make an office appointment.  Return in 3 months for follow-up  Follow-up AA

## 2016-03-20 NOTE — Progress Notes (Signed)
Pre visit review using our clinic review tool, if applicable. No additional management support is needed unless otherwise documented below in the visit note. 

## 2016-03-24 NOTE — Telephone Encounter (Signed)
Office notes from 3/31 faxed to [facility] regarding diabetic foot exam.

## 2016-03-25 ENCOUNTER — Emergency Department (HOSPITAL_COMMUNITY)
Admission: EM | Admit: 2016-03-25 | Discharge: 2016-03-25 | Disposition: A | Discharge status: Home / Self Care | Payer: Medicare Other | Attending: Emergency Medicine | Admitting: Emergency Medicine

## 2016-03-25 ENCOUNTER — Encounter (HOSPITAL_COMMUNITY): Payer: Self-pay

## 2016-03-25 ENCOUNTER — Ambulatory Visit: Payer: Medicare Other | Admitting: Internal Medicine

## 2016-03-25 ENCOUNTER — Emergency Department (HOSPITAL_COMMUNITY): Payer: Medicare Other

## 2016-03-25 DIAGNOSIS — Y999 Unspecified external cause status: Secondary | ICD-10-CM | POA: Insufficient documentation

## 2016-03-25 DIAGNOSIS — R079 Chest pain, unspecified: Secondary | ICD-10-CM | POA: Diagnosis not present

## 2016-03-25 DIAGNOSIS — Y9389 Activity, other specified: Secondary | ICD-10-CM | POA: Diagnosis not present

## 2016-03-25 DIAGNOSIS — Y929 Unspecified place or not applicable: Secondary | ICD-10-CM | POA: Insufficient documentation

## 2016-03-25 DIAGNOSIS — Z87891 Personal history of nicotine dependence: Secondary | ICD-10-CM | POA: Insufficient documentation

## 2016-03-25 DIAGNOSIS — X500XXA Overexertion from strenuous movement or load, initial encounter: Secondary | ICD-10-CM | POA: Diagnosis not present

## 2016-03-25 DIAGNOSIS — S20219A Contusion of unspecified front wall of thorax, initial encounter: Secondary | ICD-10-CM | POA: Insufficient documentation

## 2016-03-25 DIAGNOSIS — S20212A Contusion of left front wall of thorax, initial encounter: Secondary | ICD-10-CM | POA: Diagnosis not present

## 2016-03-25 DIAGNOSIS — S299XXA Unspecified injury of thorax, initial encounter: Secondary | ICD-10-CM | POA: Diagnosis present

## 2016-03-25 DIAGNOSIS — E119 Type 2 diabetes mellitus without complications: Secondary | ICD-10-CM | POA: Diagnosis not present

## 2016-03-25 DIAGNOSIS — E785 Hyperlipidemia, unspecified: Secondary | ICD-10-CM | POA: Insufficient documentation

## 2016-03-25 MED ORDER — IBUPROFEN 800 MG PO TABS: 800.0000 mg | ORAL_TABLET | Freq: Three times a day (TID) | ORAL | Status: DC

## 2016-03-25 MED ORDER — HYDROCODONE-ACETAMINOPHEN 5-325 MG PO TABS: 2.0000 | ORAL_TABLET | ORAL | Status: DC | PRN

## 2016-03-25 NOTE — ED Provider Notes (Signed)
CSN: [id number]     Arrival date & time 03/25/16  0818 History   First MD Initiated Contact with Patient 03/25/16 0831     Chief Complaint  Patient presents with  . chest wall pain      (Consider location/radiation/quality/duration/timing/severity/associated sxs/prior Treatment) Patient is a 55 y.o. male presenting with chest pain. The history is provided by the patient. No language interpreter was used.  Chest Pain Pain location:  L chest Pain quality: aching   Pain radiates to:  Upper back Pain radiates to the back: no   Pain severity:  Moderate Onset quality:  Gradual Duration:  4 days Timing:  Constant Progression:  Worsening Chronicity:  New Context: lifting   Relieved by:  Nothing Worsened by:  Nothing tried Ineffective treatments:  None tried Risk factors: hypertension    Pt complains of pain in left chest.  Pt reports he was lifting cinderblocks on Sunday and began having pain Monday morning.  Pt unsure if block hit him.   Pt has a brick to her chest.  Past Medical History  Diagnosis Date  . Anxiety   . GERD (gastroesophageal reflux disease)   . Hyperlipidemia   . Diabetes mellitus   . ADD (attention deficit disorder with hyperactivity)   . Hx of colonic polyp - sessile serrated polyp 12/07/2014   Past Surgical History  Procedure Laterality Date  . Pilonidal cyst excision     Family History  Problem Relation Age of Onset  . Brain cancer Mother   . Diabetes Father   . Diabetes Sister   . Colon cancer Neg Hx   . Esophageal cancer Neg Hx   . Rectal cancer Neg Hx   . Stomach cancer Neg Hx   . Stroke Brother   . Seizures Brother    Social History  Substance Use Topics  . Smoking status: Former Smoker    Quit date: 08/24/1996  . Smokeless tobacco: Never Used  . Alcohol Use: 0.0 oz/week    0 Standard drinks or equivalent per week     Comment: 3-40 oz a day of beer     Review of Systems  Cardiovascular: Positive for chest pain.  All other systems  reviewed and are negative.     Allergies  Review of patient's allergies indicates no known allergies.  Home Medications   Prior to Admission medications   Medication Sig Start Date End Date Taking? Authorizing Provider  ACCU-CHEK AVIVA PLUS test strip USE TO TEST BLOOD SUGAR DAILY. 08/08/15   Marletta Lor, MD  ACCU-CHEK SOFTCLIX LANCETS lancets USE TO TEST BLOOD SUGAR DAILY. 08/08/15   Marletta Lor, MD  acetaminophen (TYLENOL) 325 MG tablet Take 650 mg by mouth every 6 (six) hours as needed for moderate pain.    Historical Provider, MD  aspirin 81 MG tablet Take 81 mg by mouth daily.      Historical Provider, MD  atorvastatin (LIPITOR) 40 MG tablet TAKE ONE (1) TABLET EACH DAY 03/05/16   Marletta Lor, MD  citalopram (CELEXA) 10 MG tablet Take 10 mg by mouth daily.    Historical Provider, MD  fluticasone (FLONASE) 50 MCG/ACT nasal spray USE TWO SPRAYS IN BOTH NOSTRIL DAILY 12/26/15   Marletta Lor, MD  gabapentin (NEURONTIN) 300 MG capsule Take 300 mg by mouth at bedtime.     Historical Provider, MD  glipiZIDE (GLUCOTROL XL) 5 MG 24 hr tablet TAKE ONE (1) TABLET EACH DAY 03/05/16   Marletta Lor, MD  hydrochlorothiazide (  MICROZIDE) 12.5 MG capsule Take 1 capsule by mouth daily. 11/20/15   Historical Provider, MD  labetalol (NORMODYNE) 300 MG tablet Take 1 tablet (300 mg total) by mouth 2 (two) times daily. 08/08/15   Marletta Lor, MD  lisinopril (PRINIVIL,ZESTRIL) 40 MG tablet TAKE ONE (1) TABLET EACH DAY 03/05/16   Marletta Lor, MD  metFORMIN (GLUCOPHAGE) 1000 MG tablet TAKE ONE TABLET TWICE DAILY WITH MEALS 03/05/16   Marletta Lor, MD  Multiple Vitamin (MULTIVITAMIN WITH MINERALS) TABS tablet Take 1 tablet by mouth daily. 12/18/15   Erline Hau, MD   BP 167/97 mmHg  Pulse 80  Temp(Src) 98.1 F (36.7 C) (Oral)  Resp 18  Ht 5\' 8"  (1.727 m)  Wt 90.719 kg  BMI 30.42 kg/m2  SpO2 99% Physical Exam  Constitutional: He is  oriented to person, place, and time. He appears well-developed and well-nourished.  HENT:  Head: Normocephalic.  Eyes: EOM are normal.  Neck: Normal range of motion.  Cardiovascular: Normal rate.   Pulmonary/Chest: Effort normal.  Bruised left lower chest area,  Tender to palpation   Abdominal: Soft. He exhibits no distension.  Musculoskeletal: Normal range of motion.  Neurological: He is alert and oriented to person, place, and time.  Psychiatric: He has a normal mood and affect.  Nursing note and vitals reviewed.   ED Course  Procedures (including critical care time) Labs Review Labs Reviewed - No data to display  Imaging Review Dg Ribs Unilateral W/chest Left  03/25/2016  CLINICAL DATA:  Left-sided chest pain after heavy lifting EXAM: LEFT RIBS AND CHEST - 3+ VIEW COMPARISON:  Chest radiograph November 08, 2006; chest CT November 08, 2006 FINDINGS: Frontal chest as well as oblique and cone-down lower rib images were obtained. There is no edema or consolidation. There is stable asymmetric opacity in the left apex. Heart size and pulmonary vascularity are normal. No adenopathy. There is no demonstrable pneumothorax or effusion. There is chronic deformity involving the left anterior second and third ribs. There old healed fractures of the posterior left fifth, sixth, and seventh ribs. No acute fracture evident. IMPRESSION: Evidence of old rib trauma on the left. No acute fracture. No edema or consolidation. Electronically Signed   By: Lowella Grip III M.D.   On: 03/25/2016 09:02   I have personally reviewed and evaluated these images and lab results as part of my medical decision-making.   EKG Interpretation   Date/Time:  Wednesday March 25 2016 10:46:43 EDT Ventricular Rate:  72 PR Interval:  138 QRS Duration: 84 QT Interval:  392 QTC Calculation: 429 R Axis:   -18 Text Interpretation:  Sinus rhythm Borderline left axis deviation Baseline  wander in lead(s) II III aVF V1 No  previous ECGs available Confirmed by  ZACKOWSKI  MD, SCOTT ([id number]) on 03/25/2016 11:01:33 AM      MDM   Final diagnoses:  Contusion, chest wall, unspecified laterality, initial encounter    Meds ordered this encounter  Medications  . HYDROcodone-acetaminophen (NORCO/VICODIN) 5-325 MG tablet    Sig: Take 2 tablets by mouth every 4 (four) hours as needed.    Dispense:  10 tablet    Refill:  0    Order Specific Question:  Supervising Provider    Answer:  Sabra Heck, BRIAN [[id number]]  . ibuprofen (ADVIL,MOTRIN) 800 MG tablet    Sig: Take 1 tablet (800 mg total) by mouth 3 (three) times daily.    Dispense:  21 tablet    Refill:  0    Order Specific Question:  Supervising Provider    Answer:  Noemi Chapel [[id number]]    An After Visit Summary was printed and given to the patient.  Hollace Kinnier [place], PA-C 03/25/16 [place], MD 03/26/16 [phone]

## 2016-03-25 NOTE — Discharge Instructions (Signed)

## 2016-03-25 NOTE — ED Notes (Signed)
Pt reports woke up Monday morning with pain and tenderness under left breast.  Reports he thinks he picked up something heavy Sunday.  Area sore to touch and hurts worse with movement.

## 2016-03-30 ENCOUNTER — Other Ambulatory Visit: Payer: Self-pay | Admitting: Internal Medicine

## 2016-03-30 NOTE — Telephone Encounter (Signed)
Pt states he discussed at last visit diabetic shoes for pt due to issues with his feet. Mom states [facility] in [place] states they need a Rx from dr K in order to fill this request for diabetic shoes  Phone: [phone]  Fax:  [phone]

## 2016-03-31 NOTE — Telephone Encounter (Signed)
Anthony [name] notified order for Diabetic shoes was faxed to [facility]. Anthony [name] verbalized understanding.

## 2016-04-08 ENCOUNTER — Ambulatory Visit: Payer: Medicare Other | Admitting: Internal Medicine

## 2016-04-11 ENCOUNTER — Emergency Department (HOSPITAL_COMMUNITY)
Admission: EM | Admit: 2016-04-11 | Discharge: 2016-04-11 | Disposition: A | Discharge status: Home / Self Care | Payer: Medicare Other | Attending: Emergency Medicine | Admitting: Emergency Medicine

## 2016-04-11 ENCOUNTER — Encounter (HOSPITAL_COMMUNITY): Payer: Self-pay

## 2016-04-11 DIAGNOSIS — Z7984 Long term (current) use of oral hypoglycemic drugs: Secondary | ICD-10-CM | POA: Diagnosis not present

## 2016-04-11 DIAGNOSIS — K645 Perianal venous thrombosis: Secondary | ICD-10-CM | POA: Insufficient documentation

## 2016-04-11 DIAGNOSIS — Z7982 Long term (current) use of aspirin: Secondary | ICD-10-CM | POA: Insufficient documentation

## 2016-04-11 DIAGNOSIS — Z79899 Other long term (current) drug therapy: Secondary | ICD-10-CM | POA: Diagnosis not present

## 2016-04-11 DIAGNOSIS — E785 Hyperlipidemia, unspecified: Secondary | ICD-10-CM | POA: Insufficient documentation

## 2016-04-11 DIAGNOSIS — E119 Type 2 diabetes mellitus without complications: Secondary | ICD-10-CM | POA: Insufficient documentation

## 2016-04-11 DIAGNOSIS — Z87891 Personal history of nicotine dependence: Secondary | ICD-10-CM | POA: Diagnosis not present

## 2016-04-11 MED ORDER — HYDROCORTISONE 2.5 % RE CREA
TOPICAL_CREAM | Freq: Two times a day (BID) | RECTAL | Status: DC
  Filled 2016-04-11: qty 28.35

## 2016-04-11 MED ORDER — HYDROCORTISONE 2.5 % RE CREA: TOPICAL_CREAM | Freq: Two times a day (BID) | RECTAL | Status: DC

## 2016-04-11 MED ORDER — LIDOCAINE-EPINEPHRINE (PF) 2 %-1:200000 IJ SOLN
10.0000 mL | Freq: Once | INTRAMUSCULAR | Status: AC
  Administered 2016-04-11: 10 mL
  Filled 2016-04-11: qty 20

## 2016-04-11 MED ORDER — HYDROCORTISONE ACETATE 25 MG RE SUPP
RECTAL | Status: AC
  Filled 2016-04-11: qty 1

## 2016-04-11 NOTE — ED Notes (Signed)
Pt reports he has cyst on buttocks that started bleeding 2 days ago. Pt also reports pain started 2 days ago.

## 2016-04-11 NOTE — Discharge Instructions (Signed)
Disposable Sitz Bath A disposable sitz bath is a plastic basin that fits over the toilet. A bag is hung above the toilet and is connected to a tube that opens into the disposable sitz bath. The bag is filled with warm water that can flow into the basin through the tube.  HOW TO USE A DISPOSABLE SITZ BATH  Close the clamp on the tubing before filling the bag with water. This is to prevent leakage.  Fill the sitz bath basin and the plastic bag with warm water.  Place the filled basin on the toilet with the seat raised. Make sure the overflow opening is facing toward the back of the toilet.  Hang the filled plastic bag overhead on a hook or towel rack close to the toilet. When the bag is unclamped, a steady stream of water will flow from the bag, through the tubing, and into the basin.  Attach the tubing to the opening on the basin.  Sit on the basin positioned on the toilet seat and release the clamp. This will allow warm water to flush the area around your genitals and anus (perineum).  Remain sitting on the basin for approximately 15 to 20 minutes.  Stand up and pat the perineum area dry. If needed, apply clean bandages (dressings) to the affected area.  Tip the basin into the toilet to remove any remaining water and flush the toilet.  Wash the basin with warm water and soap. Let it dry in the sink.  Store the basin and tubing in a clean, dry area.  Wash your hands with soap and water. SEEK MEDICAL CARE IF: You get worse instead of better. Stop the sitz baths if you get worse. MAKE SURE YOU:  Understand these instructions.  Will watch your condition.  Will get help right away if you are not doing well or get worse.   This information is not intended to replace advice given to you by your health care provider. Make sure you discuss any questions you have with your health care provider.   Document Released: 06/07/2012 Document Revised: 08/31/2012 Document Reviewed:  06/07/2012 Elsevier Interactive Patient Education 2016 [employer].  Hemorrhoids Hemorrhoids are swollen veins around the rectum or anus. There are two types of hemorrhoids:   Internal hemorrhoids. These occur in the veins just inside the rectum. They may poke through to the outside and become irritated and painful.  External hemorrhoids. These occur in the veins outside the anus and can be felt as a painful swelling or hard lump near the anus. CAUSES  Pregnancy.   Obesity.   Constipation or diarrhea.   Straining to have a bowel movement.   Sitting for long periods on the toilet.  Heavy lifting or other activity that caused you to strain.  Anal intercourse. SYMPTOMS   Pain.   Anal itching or irritation.   Rectal bleeding.   Fecal leakage.   Anal swelling.   One or more lumps around the anus.  DIAGNOSIS  Your caregiver may be able to diagnose hemorrhoids by visual examination. Other examinations or tests that may be performed include:   Examination of the rectal area with a gloved hand (digital rectal exam).   Examination of anal canal using a small tube (scope).   A blood test if you have lost a significant amount of blood.  A test to look inside the colon (sigmoidoscopy or colonoscopy). TREATMENT Most hemorrhoids can be treated at home. However, if symptoms do not seem to be getting  better or if you have a lot of rectal bleeding, your caregiver may perform a procedure to help make the hemorrhoids get smaller or remove them completely. Possible treatments include:   Placing a rubber band at the base of the hemorrhoid to cut off the circulation (rubber band ligation).   Injecting a chemical to shrink the hemorrhoid (sclerotherapy).   Using a tool to burn the hemorrhoid (infrared light therapy).   Surgically removing the hemorrhoid (hemorrhoidectomy).   Stapling the hemorrhoid to block blood flow to the tissue (hemorrhoid stapling).  HOME  CARE INSTRUCTIONS   Eat foods with fiber, such as whole grains, beans, nuts, fruits, and vegetables. Ask your doctor about taking products with added fiber in them (fibersupplements).  Increase fluid intake. Drink enough water and fluids to keep your urine clear or pale yellow.   Exercise regularly.   Go to the bathroom when you have the urge to have a bowel movement. Do not wait.   Avoid straining to have bowel movements.   Keep the anal area dry and clean. Use wet toilet paper or moist towelettes after a bowel movement.   Medicated creams and suppositories may be used or applied as directed.   Only take over-the-counter or prescription medicines as directed by your caregiver.   Take warm sitz baths for 15-20 minutes, 3-4 times a day to ease pain and discomfort.   Place ice packs on the hemorrhoids if they are tender and swollen. Using ice packs between sitz baths may be helpful.   Put ice in a plastic bag.   Place a towel between your skin and the bag.   Leave the ice on for 15-20 minutes, 3-4 times a day.   Do not use a donut-shaped pillow or sit on the toilet for long periods. This increases blood pooling and pain.  SEEK MEDICAL CARE IF:  You have increasing pain and swelling that is not controlled by treatment or medicine.  You have uncontrolled bleeding.  You have difficulty or you are unable to have a bowel movement.  You have pain or inflammation outside the area of the hemorrhoids. MAKE SURE YOU:  Understand these instructions.  Will watch your condition.  Will get help right away if you are not doing well or get worse.   This information is not intended to replace advice given to you by your health care provider. Make sure you discuss any questions you have with your health care provider.   Document Released: 12/04/2000 Document Revised: 11/23/2012 Document Reviewed: 10/11/2012 Elsevier Interactive Patient Education 2016 Elsevier  Inc.  High-Fiber Diet Fiber, also called dietary fiber, is a type of carbohydrate found in fruits, vegetables, whole grains, and beans. A high-fiber diet can have many health benefits. Your health care provider may recommend a high-fiber diet to help:  Prevent constipation. Fiber can make your bowel movements more regular.  Lower your cholesterol.  Relieve hemorrhoids, uncomplicated diverticulosis, or irritable bowel syndrome.  Prevent overeating as part of a weight-loss plan.  Prevent heart disease, type 2 diabetes, and certain cancers. WHAT IS MY PLAN? The recommended daily intake of fiber includes:  38 grams for men under age 70.  20 grams for men over age 59.  22 grams for women under age 62.  17 grams for women over age 11. You can get the recommended daily intake of dietary fiber by eating a variety of fruits, vegetables, grains, and beans. Your health care provider may also recommend a fiber supplement if it is  not possible to get enough fiber through your diet. WHAT DO I NEED TO KNOW ABOUT A HIGH-FIBER DIET?  Fiber supplements have not been widely studied for their effectiveness, so it is better to get fiber through food sources.  Always check the fiber content on thenutrition facts label of any prepackaged food. Look for foods that contain at least 5 grams of fiber per serving.  Ask your dietitian if you have questions about specific foods that are related to your condition, especially if those foods are not listed in the following section.  Increase your daily fiber consumption gradually. Increasing your intake of dietary fiber too quickly may cause bloating, cramping, or gas.  Drink plenty of water. Water helps you to digest fiber. WHAT FOODS CAN I EAT? Grains Whole-grain breads. Multigrain cereal. Oats and oatmeal. Brown rice. Barley. Bulgur wheat. Empire. Bran muffins. Popcorn. Rye wafer crackers. Vegetables Sweet potatoes. Spinach. Kale. Artichokes. Cabbage.  Broccoli. Green peas. Carrots. Squash. Fruits Berries. Pears. Apples. Oranges. Avocados. Prunes and raisins. Dried figs. Meats and Other Protein Sources Navy, kidney, pinto, and soy beans. Split peas. Lentils. Nuts and seeds. Dairy Fiber-fortified yogurt. Beverages Fiber-fortified soy milk. Fiber-fortified orange juice. Other Fiber bars. The items listed above may not be a complete list of recommended foods or beverages. Contact your dietitian for more options. WHAT FOODS ARE NOT RECOMMENDED? Grains White bread. Pasta made with refined flour. White rice. Vegetables Fried potatoes. Canned vegetables. Well-cooked vegetables.  Fruits Fruit juice. Cooked, strained fruit. Meats and Other Protein Sources Fatty cuts of meat. Fried Sales executive or fried fish. Dairy Milk. Yogurt. Cream cheese. Sour cream. Beverages Soft drinks. Other Cakes and pastries. Butter and oils. The items listed above may not be a complete list of foods and beverages to avoid. Contact your dietitian for more information. WHAT ARE SOME TIPS FOR INCLUDING HIGH-FIBER FOODS IN MY DIET?  Eat a wide variety of high-fiber foods.  Make sure that half of all grains consumed each day are whole grains.  Replace breads and cereals made from refined flour or white flour with whole-grain breads and cereals.  Replace white rice with brown rice, bulgur wheat, or millet.  Start the day with a breakfast that is high in fiber, such as a cereal that contains at least 5 grams of fiber per serving.  Use beans in place of meat in soups, salads, or pasta.  Eat high-fiber snacks, such as berries, raw vegetables, nuts, or popcorn.   This information is not intended to replace advice given to you by your health care provider. Make sure you discuss any questions you have with your health care provider.   Document Released: 12/07/2005 Document Revised: 12/28/2014 Document Reviewed: 05/22/2014 Elsevier Interactive Patient Education [employer]

## 2016-04-11 NOTE — ED Provider Notes (Signed)
CSN: [id number]     Arrival date & time 04/11/16  [id number] History   First MD Initiated Contact with Patient 04/11/16 [phone]     Chief Complaint  Patient presents with  . Cyst  PT SAID THAT HE HAS NOTICED A BUMP ON HIS BOTTOM THAT HAS BEEN BLEEDING FOR THE PAST 2 DAYS.  THE PT HAS NEVER HAD ANYTHING LIKE THIS IN THE PAST.   (Consider location/radiation/quality/duration/timing/severity/associated sxs/prior Treatment) The history is provided by the patient.    Past Medical History  Diagnosis Date  . Anxiety   . GERD (gastroesophageal reflux disease)   . Hyperlipidemia   . Diabetes mellitus   . ADD (attention deficit disorder with hyperactivity)   . Hx of colonic polyp - sessile serrated polyp 12/07/2014   Past Surgical History  Procedure Laterality Date  . Pilonidal cyst excision     Family History  Problem Relation Age of Onset  . Brain cancer Mother   . Diabetes Father   . Diabetes Sister   . Colon cancer Neg Hx   . Esophageal cancer Neg Hx   . Rectal cancer Neg Hx   . Stomach cancer Neg Hx   . Stroke Brother   . Seizures Brother    Social History  Substance Use Topics  . Smoking status: Former Smoker    Quit date: 08/24/1996  . Smokeless tobacco: Never Used  . Alcohol Use: 0.0 oz/week    0 Standard drinks or equivalent per week     Comment: 3-40 oz a day of beer     Review of Systems  Gastrointestinal: Positive for anal bleeding.  All other systems reviewed and are negative.     Allergies  Review of patient's allergies indicates no known allergies.  Home Medications   Prior to Admission medications   Medication Sig Start Date End Date Taking? Authorizing Provider  ACCU-CHEK AVIVA PLUS test strip USE TO TEST BLOOD SUGAR DAILY. 08/08/15   Marletta Lor, MD  ACCU-CHEK SOFTCLIX LANCETS lancets USE TO TEST BLOOD SUGAR DAILY. 08/08/15   Marletta Lor, MD  acetaminophen (TYLENOL) 325 MG tablet Take 650 mg by mouth every 6 (six) hours as needed for  moderate pain.    Historical Provider, MD  aspirin 81 MG tablet Take 81 mg by mouth daily.      Historical Provider, MD  atorvastatin (LIPITOR) 40 MG tablet TAKE ONE (1) TABLET EACH DAY 03/05/16   Marletta Lor, MD  citalopram (CELEXA) 10 MG tablet Take 10 mg by mouth daily.    Historical Provider, MD  fluticasone (FLONASE) 50 MCG/ACT nasal spray USE TWO SPRAYS IN BOTH NOSTRIL DAILY 12/26/15   Marletta Lor, MD  gabapentin (NEURONTIN) 300 MG capsule Take 300 mg by mouth at bedtime.     Historical Provider, MD  glipiZIDE (GLUCOTROL XL) 5 MG 24 hr tablet TAKE ONE (1) TABLET EACH DAY 03/05/16   Marletta Lor, MD  hydrochlorothiazide (MICROZIDE) 12.5 MG capsule Take 1 capsule by mouth daily. 11/20/15   Historical Provider, MD  HYDROcodone-acetaminophen (NORCO/VICODIN) 5-325 MG tablet Take 2 tablets by mouth every 4 (four) hours as needed. 03/25/16   Fransico Meadow, PA-C  ibuprofen (ADVIL,MOTRIN) 800 MG tablet Take 1 tablet (800 mg total) by mouth 3 (three) times daily. 03/25/16   Fransico Meadow, PA-C  labetalol (NORMODYNE) 300 MG tablet Take 1 tablet (300 mg total) by mouth 2 (two) times daily. 08/08/15   Marletta Lor, MD  lisinopril (PRINIVIL,ZESTRIL) 40 MG  tablet TAKE ONE (1) TABLET EACH DAY 03/05/16   Marletta Lor, MD  metFORMIN (GLUCOPHAGE) 1000 MG tablet TAKE ONE TABLET TWICE DAILY WITH MEALS 03/05/16   Marletta Lor, MD  Multiple Vitamin (MULTIVITAMIN WITH MINERALS) TABS tablet Take 1 tablet by mouth daily. 12/18/15   Erline Hau, MD   BP 121/81 mmHg  Pulse 92  Temp(Src) 97.6 F (36.4 C) (Oral)  Ht 5\' 8"  (1.727 m)  Wt 190 lb (86.183 kg)  BMI 28.90 kg/m2  SpO2 98% Physical Exam  Constitutional: He is oriented to person, place, and time. He appears well-developed and well-nourished.  HENT:  Head: Normocephalic and atraumatic.  Eyes: Conjunctivae are normal. Pupils are equal, round, and reactive to light.  Neck: Normal range of motion. Neck supple.   Cardiovascular: Normal rate, regular rhythm and normal heart sounds.   Pulmonary/Chest: Effort normal and breath sounds normal.  Abdominal: Soft. Bowel sounds are normal.  Genitourinary:  BLEEDING AND THROMBOSED HEMORRHOID  Musculoskeletal: Normal range of motion.  Neurological: He is alert and oriented to person, place, and time.  Vitals reviewed.   ED Course  .Marland KitchenIncision and Drainage Date/Time: 04/11/2016 7:40 AM Performed by: Isla Pence Authorized by: Isla Pence Consent: Verbal consent obtained. Risks and benefits: risks, benefits and alternatives were discussed Consent given by: patient Patient understanding: patient states understanding of the procedure being performed Patient consent: the patient's understanding of the procedure matches consent given Procedure consent: procedure consent matches procedure scheduled Relevant documents: relevant documents present and verified Test results: test results available and properly labeled Site marked: the operative site was marked Required items: required blood products, implants, devices, and special equipment available Patient identity confirmed: verbally with patient Time out: Immediately prior to procedure a "time out" was called to verify the correct patient, procedure, equipment, support staff and site/side marked as required. Type: hematoma Body area: anogenital Location details: perianal Anesthesia: local infiltration Local anesthetic: lidocaine 2% with epinephrine Patient sedated: no Scalpel size: 11 Needle gauge: 22 Incision type: elliptical Complexity: simple Drainage: bloody Wound treatment: wound left open Patient tolerance: Patient tolerated the procedure well with no immediate complications Comments: THROMBOSED HEMORRHOID INCISED AND BLOOD CLOT REMOVED   (including critical care time) Labs Review Labs Reviewed - No data to display  Imaging Review No results found. I have personally reviewed and  evaluated these images and lab results as part of my medical decision-making.   EKG Interpretation None      MDM  PT TOLERATED PROCEDURE WELL.  HE IS TOLD TO RETURN IF WORSE AND F/U WITH SURGEON Final diagnoses:  None    THROMBOSED HEMORRHOID    Isla Pence, MD 04/11/16 [phone]

## 2016-04-22 ENCOUNTER — Encounter: Payer: Self-pay | Admitting: Internal Medicine

## 2016-04-22 ENCOUNTER — Ambulatory Visit (INDEPENDENT_AMBULATORY_CARE_PROVIDER_SITE_OTHER): Payer: Medicare Other | Admitting: Internal Medicine

## 2016-04-22 VITALS — BP 140/90 | HR 82 | Temp 98.3°F | Resp 20 | Ht 68.0 in | Wt 186.0 lb

## 2016-04-22 DIAGNOSIS — E538 Deficiency of other specified B group vitamins: Secondary | ICD-10-CM | POA: Diagnosis not present

## 2016-04-22 DIAGNOSIS — E119 Type 2 diabetes mellitus without complications: Secondary | ICD-10-CM | POA: Diagnosis not present

## 2016-04-22 DIAGNOSIS — E7800 Pure hypercholesterolemia, unspecified: Secondary | ICD-10-CM | POA: Diagnosis not present

## 2016-04-22 DIAGNOSIS — F1020 Alcohol dependence, uncomplicated: Secondary | ICD-10-CM

## 2016-04-22 DIAGNOSIS — I10 Essential (primary) hypertension: Secondary | ICD-10-CM | POA: Diagnosis not present

## 2016-04-22 NOTE — Patient Instructions (Signed)
Discontinue all alcohol products  Follow-up with AA  Limit your sodium (Salt) intake    It is important that you exercise regularly, at least 20 minutes 3 to 4 times per week.  If you develop chest pain or shortness of breath seek  medical attention.   Please check your hemoglobin A1c every 3 months

## 2016-04-22 NOTE — Progress Notes (Signed)
Pre visit review using our clinic review tool, if applicable. No additional management support is needed unless otherwise documented below in the visit note. 

## 2016-04-23 ENCOUNTER — Encounter: Payer: Self-pay | Admitting: Internal Medicine

## 2016-04-23 ENCOUNTER — Telehealth: Payer: Self-pay | Admitting: Internal Medicine

## 2016-04-23 NOTE — Progress Notes (Signed)
Subjective:    Patient ID: Anthony [name], male    DOB: 1961/04/06, 55 y.o.   MRN: [id number]  HPI  55 year old patient who is seen today for follow-up.  He has a history of essential hypertension as well as type 2 diabetes.  He also has a history of alcoholism. He states that he still drinks "a few beers"daily.  He has been admitted for detoxification at least twice.  He still has not purchased dictating in any outpatient treatment program.  AA and other options have been made available to the patient.  He is accompanied by a friend today who also has been very supportive and has encouraged the patient repeatedly to purchase a patent in [place] activities He maintains good glycemic control  Lab Results  Component Value Date   HGBA1C 6.6* 03/20/2016    Past Medical History  Diagnosis Date  . Anxiety   . GERD (gastroesophageal reflux disease)   . Hyperlipidemia   . Diabetes mellitus   . ADD (attention deficit disorder with hyperactivity)   . Hx of colonic polyp - sessile serrated polyp 12/07/2014     Social History   Social History  . Marital Status: Single    Spouse Name: N/A  . Number of Children: N/A  . Years of Education: N/A   Occupational History  . Not on file.   Social History Main Topics  . Smoking status: Former Smoker    Quit date: 08/24/1996  . Smokeless tobacco: Never Used  . Alcohol Use: 0.0 oz/week    0 Standard drinks or equivalent per week     Comment: 3-40 oz a day of beer   . Drug Use: No  . Sexual Activity: Not on file   Other Topics Concern  . Not on file   Social History Narrative    Past Surgical History  Procedure Laterality Date  . Pilonidal cyst excision      Family History  Problem Relation Age of Onset  . Brain cancer Mother   . Diabetes Father   . Diabetes Sister   . Colon cancer Neg Hx   . Esophageal cancer Neg Hx   . Rectal cancer Neg Hx   . Stomach cancer Neg Hx   . Stroke Brother   . Seizures Brother     No Known  Allergies  Current Outpatient Prescriptions on File Prior to Visit  Medication Sig Dispense Refill  . ACCU-CHEK AVIVA PLUS test strip USE TO TEST BLOOD SUGAR DAILY. 100 each 6  . ACCU-CHEK SOFTCLIX LANCETS lancets USE TO TEST BLOOD SUGAR DAILY. 100 each 6  . acetaminophen (TYLENOL) 325 MG tablet Take 650 mg by mouth every 6 (six) hours as needed for moderate pain.    Marland Kitchen aspirin 81 MG tablet Take 81 mg by mouth daily.      Marland Kitchen atorvastatin (LIPITOR) 40 MG tablet TAKE ONE (1) TABLET EACH DAY 90 tablet 1  . citalopram (CELEXA) 10 MG tablet Take 10 mg by mouth daily.    . fluticasone (FLONASE) 50 MCG/ACT nasal spray USE TWO SPRAYS IN BOTH NOSTRIL DAILY 48 g 1  . gabapentin (NEURONTIN) 300 MG capsule Take 300 mg by mouth at bedtime.     Marland Kitchen glipiZIDE (GLUCOTROL XL) 5 MG 24 hr tablet TAKE ONE (1) TABLET EACH DAY 90 tablet 1  . hydrochlorothiazide (MICROZIDE) 12.5 MG capsule Take 1 capsule by mouth daily.    Marland Kitchen HYDROcodone-acetaminophen (NORCO/VICODIN) 5-325 MG tablet Take 2 tablets by mouth every 4 (four)  hours as needed. 10 tablet 0  . hydrocortisone (ANUSOL-HC) 2.5 % rectal cream Place rectally 2 (two) times daily. 30 g 0  . ibuprofen (ADVIL,MOTRIN) 800 MG tablet Take 1 tablet (800 mg total) by mouth 3 (three) times daily. 21 tablet 0  . labetalol (NORMODYNE) 300 MG tablet Take 1 tablet (300 mg total) by mouth 2 (two) times daily. 180 tablet 5  . lisinopril (PRINIVIL,ZESTRIL) 40 MG tablet TAKE ONE (1) TABLET EACH DAY 90 tablet 1  . metFORMIN (GLUCOPHAGE) 1000 MG tablet TAKE ONE TABLET TWICE DAILY WITH MEALS 180 tablet 1  . Multiple Vitamin (MULTIVITAMIN WITH MINERALS) TABS tablet Take 1 tablet by mouth daily.     No current facility-administered medications on file prior to visit.    BP 140/90 mmHg  Pulse 82  Temp(Src) 98.3 F (36.8 C) (Oral)  Resp 20  Ht 5\' 8"  (1.727 m)  Wt 186 lb (84.369 kg)  BMI 28.29 kg/m2  SpO2 98%     Review of Systems  Constitutional: Negative for fever, chills,  appetite change and fatigue.  HENT: Negative for congestion, dental problem, ear pain, hearing loss, sore throat, tinnitus, trouble swallowing and voice change.   Eyes: Negative for pain, discharge and visual disturbance.  Respiratory: Negative for cough, chest tightness, wheezing and stridor.   Cardiovascular: Negative for chest pain, palpitations and leg swelling.  Gastrointestinal: Negative for nausea, vomiting, abdominal pain, diarrhea, constipation, blood in stool and abdominal distention.  Genitourinary: Negative for urgency, hematuria, flank pain, discharge, difficulty urinating and genital sores.  Musculoskeletal: Negative for myalgias, back pain, joint swelling, arthralgias, gait problem and neck stiffness.  Skin: Negative for rash.  Neurological: Negative for dizziness, syncope, speech difficulty, weakness, numbness and headaches.  Hematological: Negative for adenopathy. Does not bruise/bleed easily.  Psychiatric/Behavioral: Negative for behavioral problems and dysphoric mood. The patient is not nervous/anxious.        Objective:   Physical Exam  Constitutional: He is oriented to person, place, and time. He appears well-developed.  HENT:  Head: Normocephalic.  Right Ear: External ear normal.  Left Ear: External ear normal.  Eyes: Conjunctivae and EOM are normal.  Neck: Normal range of motion.  Cardiovascular: Normal rate and normal heart sounds.   Pulmonary/Chest: Breath sounds normal.  Abdominal: Bowel sounds are normal.  Musculoskeletal: Normal range of motion. He exhibits no edema or tenderness.  Neurological: He is alert and oriented to person, place, and time.  Psychiatric: He has a normal mood and affect. His behavior is normal.          Assessment & Plan:   Alcoholism.  Outpatient treatment and follow-up.  Strongly encouraged Hypertension, stable Diabetes, stable   Recheck 3 months

## 2016-04-23 NOTE — Telephone Encounter (Signed)
[facility] Day - Client [facility] Patient Name: Anthony [name] DOB: 12/23/1960 Initial Comment Caller states has a bleeding cyst Nurse Assessment Nurse: Dimas Chyle, RN, Dellis Filbert Date/Time (Eastern Time): 04/23/2016 11:29:21 AM Confirm and document reason for call. If symptomatic, describe symptoms. You must click the next button to save text entered. ---Caller states has a bleeding cyst. Seen by PCP yesterday. Has rectal cyst. Was seen in ED recently and advised to follow up with PCP but PCP did not examine cyst. Has the patient traveled out of the country within the last 30 days? ---No Does the patient have any new or worsening symptoms? ---No Please document clinical information provided and list any resource used. ---Advised caller that I would schedule appointment with PCP. Not currently having any symptoms. Guidelines Guideline Title Affirmed Question Affirmed Notes Final Disposition User Clinical Call Randall, RN, Dellis Filbert

## 2016-04-23 NOTE — Telephone Encounter (Signed)
Appt scheduled Monday with [name]. Did confirm in note no symptoms at present.

## 2016-04-27 ENCOUNTER — Encounter: Payer: Self-pay | Admitting: Internal Medicine

## 2016-04-27 ENCOUNTER — Ambulatory Visit (INDEPENDENT_AMBULATORY_CARE_PROVIDER_SITE_OTHER): Payer: Medicare Other | Admitting: Internal Medicine

## 2016-04-27 ENCOUNTER — Telehealth: Payer: Self-pay | Admitting: Internal Medicine

## 2016-04-27 VITALS — BP 138/82 | HR 82 | Temp 98.0°F | Resp 16 | Ht 68.0 in | Wt 180.0 lb

## 2016-04-27 DIAGNOSIS — F1020 Alcohol dependence, uncomplicated: Secondary | ICD-10-CM

## 2016-04-27 DIAGNOSIS — I10 Essential (primary) hypertension: Secondary | ICD-10-CM

## 2016-04-27 NOTE — Patient Instructions (Signed)

## 2016-04-27 NOTE — Progress Notes (Signed)
Pre visit review using our clinic review tool, if applicable. No additional management support is needed unless otherwise documented below in the visit note. 

## 2016-04-27 NOTE — Telephone Encounter (Signed)
[employer] in [place] need a written Rx for the diabetic shoes.

## 2016-04-27 NOTE — Progress Notes (Signed)
Subjective:    Patient ID: Anthony [name], male    DOB: [birth year], 55 y.o.   MRN: [id number]  HPI  55 year old patient who has a history of hypertension and alcoholism.  He was seen in the ED on April 22 for a bleeding, painful, external, thrombosed hemorrhoid.  This was I&D and the patient has done well.  He states that he is here today in follow-up.  He was seen here last week on May 3.  No further hemorrhoidal pain or bleeding He does have a history of alcoholism and today states that he has "slacked up a lot". Total abstinence has been encouraged.  He has been admitted for detoxification on at least 2 prior occasions  Past Medical History  Diagnosis Date  . Anxiety   . GERD (gastroesophageal reflux disease)   . Hyperlipidemia   . Diabetes mellitus   . ADD (attention deficit disorder with hyperactivity)   . Hx of colonic polyp - sessile serrated polyp 12/07/2014     Social History   Social History  . Marital Status: Single    Spouse Name: N/A  . Number of Children: N/A  . Years of Education: N/A   Occupational History  . Not on file.   Social History Main Topics  . Smoking status: Former Smoker    Quit date: 08/24/1996  . Smokeless tobacco: Never Used  . Alcohol Use: 0.0 oz/week    0 Standard drinks or equivalent per week     Comment: 3-40 oz a day of beer   . Drug Use: No  . Sexual Activity: Not on file   Other Topics Concern  . Not on file   Social History Narrative    Past Surgical History  Procedure Laterality Date  . Pilonidal cyst excision      Family History  Problem Relation Age of Onset  . Brain cancer Mother   . Diabetes Father   . Diabetes Sister   . Colon cancer Neg Hx   . Esophageal cancer Neg Hx   . Rectal cancer Neg Hx   . Stomach cancer Neg Hx   . Stroke Brother   . Seizures Brother     No Known Allergies  Current Outpatient Prescriptions on File Prior to Visit  Medication Sig Dispense Refill  . ACCU-CHEK AVIVA PLUS test strip  USE TO TEST BLOOD SUGAR DAILY. 100 each 6  . ACCU-CHEK SOFTCLIX LANCETS lancets USE TO TEST BLOOD SUGAR DAILY. 100 each 6  . acetaminophen (TYLENOL) 325 MG tablet Take 650 mg by mouth every 6 (six) hours as needed for moderate pain.    Marland Kitchen aspirin 81 MG tablet Take 81 mg by mouth daily.      Marland Kitchen atorvastatin (LIPITOR) 40 MG tablet TAKE ONE (1) TABLET EACH DAY 90 tablet 1  . citalopram (CELEXA) 10 MG tablet Take 10 mg by mouth daily.    . fluticasone (FLONASE) 50 MCG/ACT nasal spray USE TWO SPRAYS IN BOTH NOSTRIL DAILY 48 g 1  . gabapentin (NEURONTIN) 300 MG capsule Take 300 mg by mouth at bedtime.     Marland Kitchen glipiZIDE (GLUCOTROL XL) 5 MG 24 hr tablet TAKE ONE (1) TABLET EACH DAY 90 tablet 1  . hydrochlorothiazide (MICROZIDE) 12.5 MG capsule Take 1 capsule by mouth daily.    Marland Kitchen HYDROcodone-acetaminophen (NORCO/VICODIN) 5-325 MG tablet Take 2 tablets by mouth every 4 (four) hours as needed. 10 tablet 0  . hydrocortisone (ANUSOL-HC) 2.5 % rectal cream Place rectally 2 (two) times daily.  30 g 0  . ibuprofen (ADVIL,MOTRIN) 800 MG tablet Take 1 tablet (800 mg total) by mouth 3 (three) times daily. 21 tablet 0  . labetalol (NORMODYNE) 300 MG tablet Take 1 tablet (300 mg total) by mouth 2 (two) times daily. 180 tablet 5  . lisinopril (PRINIVIL,ZESTRIL) 40 MG tablet TAKE ONE (1) TABLET EACH DAY 90 tablet 1  . metFORMIN (GLUCOPHAGE) 1000 MG tablet TAKE ONE TABLET TWICE DAILY WITH MEALS 180 tablet 1  . Multiple Vitamin (MULTIVITAMIN WITH MINERALS) TABS tablet Take 1 tablet by mouth daily.     No current facility-administered medications on file prior to visit.    BP 138/82 mmHg  Pulse 82  Temp(Src) 98 F (36.7 C) (Oral)  Resp 16  Ht 5\' 8"  (1.727 m)  Wt 180 lb (81.647 kg)  BMI 27.38 kg/m2  SpO2 96%     Review of Systems  Constitutional: Negative for fever, chills, appetite change and fatigue.  HENT: Negative for congestion, dental problem, ear pain, hearing loss, sore throat, tinnitus, trouble  swallowing and voice change.   Eyes: Negative for pain, discharge and visual disturbance.  Respiratory: Negative for cough, chest tightness, wheezing and stridor.   Cardiovascular: Negative for chest pain, palpitations and leg swelling.  Gastrointestinal: Negative for nausea, vomiting, abdominal pain, diarrhea, constipation, blood in stool and abdominal distention.  Genitourinary: Negative for urgency, hematuria, flank pain, discharge, difficulty urinating and genital sores.  Musculoskeletal: Negative for myalgias, back pain, joint swelling, arthralgias, gait problem and neck stiffness.  Skin: Negative for rash.  Neurological: Negative for dizziness, syncope, speech difficulty, weakness, numbness and headaches.  Hematological: Negative for adenopathy. Does not bruise/bleed easily.  Psychiatric/Behavioral: Negative for behavioral problems and dysphoric mood. The patient is not nervous/anxious.        Objective:   Physical Exam  Constitutional: He appears well-developed and well-nourished. No distress.  Alert and appropriate Not intoxicated  Genitourinary:  Rectal area was examined and was normal without evidence of hemorrhoidal disease          Assessment & Plan:   Status post I&D of a thrombosed external hemorrhoid.  Clinically resolved Hypertension, stable Alcoholism.  Total absence.  Again discussed.  He was given written information detailing addresses and phone numbers of all local facilities for alcohol and drug addiction  Return as scheduled for follow-up

## 2016-04-28 NOTE — Telephone Encounter (Signed)
Spoke with [employer] and they need written rx on pad fordiabeticshoes. Also need condition required for pt to have diabetic shoes circled. Will provide paperwork.

## 2016-04-28 NOTE — Telephone Encounter (Signed)
Rx for Diabetic Shoes written and paper completed and given back to [facility] to refax.

## 2016-04-28 NOTE — Telephone Encounter (Signed)
Juliann Pulse can you check on this and see if I need to send an actual written Rx or if the one you faxed last week is okay. Thanks

## 2016-04-29 ENCOUNTER — Telehealth: Payer: Self-pay | Admitting: Internal Medicine

## 2016-04-29 NOTE — Telephone Encounter (Signed)
Doris from [facility] has some questions about pt  diabetic order for shoes and is requesting a call back    [phone]

## 2016-04-29 NOTE — Telephone Encounter (Signed)
Called [facility] and spoke to [place], she said the orders do not match office note. They must match or insurance will not pay for diabetic shoes. Charlaine Dalton to resend orders and I will have Dr. Raliegh Ip correct them and fax back. Sharee Pimple verbalized understanding.

## 2016-05-01 NOTE — Telephone Encounter (Signed)
Received form, filled out by [name] and faxed back. Pt does not qualify due to cellulitis not ulcerations.

## 2016-06-17 ENCOUNTER — Ambulatory Visit: Payer: Medicare Other | Admitting: Internal Medicine

## 2016-07-22 ENCOUNTER — Ambulatory Visit: Payer: Medicare Other | Admitting: Internal Medicine

## 2016-07-29 DIAGNOSIS — E119 Type 2 diabetes mellitus without complications: Secondary | ICD-10-CM | POA: Diagnosis not present

## 2016-07-29 LAB — HM DIABETES EYE EXAM

## 2016-08-14 ENCOUNTER — Other Ambulatory Visit: Payer: Self-pay | Admitting: Internal Medicine

## 2016-08-18 ENCOUNTER — Emergency Department (HOSPITAL_COMMUNITY): Payer: Medicare Other

## 2016-08-18 ENCOUNTER — Encounter (HOSPITAL_COMMUNITY): Payer: Self-pay

## 2016-08-18 ENCOUNTER — Emergency Department (HOSPITAL_COMMUNITY)
Admission: EM | Admit: 2016-08-18 | Discharge: 2016-08-18 | Disposition: A | Discharge status: Home / Self Care | Payer: Medicare Other | Attending: Emergency Medicine | Admitting: Emergency Medicine

## 2016-08-18 DIAGNOSIS — Y92010 Kitchen of single-family (private) house as the place of occurrence of the external cause: Secondary | ICD-10-CM | POA: Diagnosis not present

## 2016-08-18 DIAGNOSIS — Z7982 Long term (current) use of aspirin: Secondary | ICD-10-CM | POA: Diagnosis not present

## 2016-08-18 DIAGNOSIS — S299XXA Unspecified injury of thorax, initial encounter: Secondary | ICD-10-CM | POA: Diagnosis present

## 2016-08-18 DIAGNOSIS — Z87891 Personal history of nicotine dependence: Secondary | ICD-10-CM | POA: Insufficient documentation

## 2016-08-18 DIAGNOSIS — Y939 Activity, unspecified: Secondary | ICD-10-CM | POA: Insufficient documentation

## 2016-08-18 DIAGNOSIS — W0110XA Fall on same level from slipping, tripping and stumbling with subsequent striking against unspecified object, initial encounter: Secondary | ICD-10-CM | POA: Insufficient documentation

## 2016-08-18 DIAGNOSIS — I10 Essential (primary) hypertension: Secondary | ICD-10-CM | POA: Insufficient documentation

## 2016-08-18 DIAGNOSIS — Z7984 Long term (current) use of oral hypoglycemic drugs: Secondary | ICD-10-CM | POA: Insufficient documentation

## 2016-08-18 DIAGNOSIS — Z79899 Other long term (current) drug therapy: Secondary | ICD-10-CM | POA: Insufficient documentation

## 2016-08-18 DIAGNOSIS — E119 Type 2 diabetes mellitus without complications: Secondary | ICD-10-CM | POA: Diagnosis not present

## 2016-08-18 DIAGNOSIS — Y999 Unspecified external cause status: Secondary | ICD-10-CM | POA: Diagnosis not present

## 2016-08-18 DIAGNOSIS — S2242XA Multiple fractures of ribs, left side, initial encounter for closed fracture: Secondary | ICD-10-CM | POA: Diagnosis not present

## 2016-08-18 DIAGNOSIS — Z791 Long term (current) use of non-steroidal anti-inflammatories (NSAID): Secondary | ICD-10-CM | POA: Diagnosis not present

## 2016-08-18 LAB — CBC WITH DIFFERENTIAL/PLATELET
BASOS PCT: 0 %
Basophils Absolute: 0 10*3/uL (ref 0.0–0.1)
EOS PCT: 1 %
Eosinophils Absolute: 0.1 10*3/uL (ref 0.0–0.7)
HEMATOCRIT: 39.5 % (ref 39.0–52.0)
HEMOGLOBIN: 13.2 g/dL (ref 13.0–17.0)
LYMPHS PCT: 19 %
Lymphs Abs: 1.7 10*3/uL (ref 0.7–4.0)
MCH: 32.1 pg (ref 26.0–34.0)
MCHC: 33.4 g/dL (ref 30.0–36.0)
MCV: 96.1 fL (ref 78.0–100.0)
MONOS PCT: 7 %
Monocytes Absolute: 0.6 10*3/uL (ref 0.1–1.0)
NEUTROS ABS: 6.4 10*3/uL (ref 1.7–7.7)
NEUTROS PCT: 73 %
Platelets: 190 10*3/uL (ref 150–400)
RBC: 4.11 MIL/uL — ABNORMAL LOW (ref 4.22–5.81)
RDW: 12 % (ref 11.5–15.5)
WBC: 8.8 10*3/uL (ref 4.0–10.5)

## 2016-08-18 LAB — BASIC METABOLIC PANEL
Anion gap: 13 (ref 5–15)
BUN: 13 mg/dL (ref 6–20)
CHLORIDE: 99 mmol/L — AB (ref 101–111)
CO2: 25 mmol/L (ref 22–32)
CREATININE: 1.06 mg/dL (ref 0.61–1.24)
Calcium: 9.4 mg/dL (ref 8.9–10.3)
GFR calc Af Amer: >60 mL/min (ref >60)
GFR calc non Af Amer: >60 mL/min (ref >60)
Glucose, Bld: 151 mg/dL — ABNORMAL HIGH (ref 65–99)
POTASSIUM: 3.7 mmol/L (ref 3.5–5.1)
Sodium: 137 mmol/L (ref 135–145)

## 2016-08-18 MED ORDER — HYDROCODONE-ACETAMINOPHEN 7.5-325 MG PO TABS: 1.0000 | ORAL_TABLET | ORAL | 0 refills | Status: DC | PRN

## 2016-08-18 MED ORDER — OXYCODONE-ACETAMINOPHEN 5-325 MG PO TABS
2.0000 | ORAL_TABLET | Freq: Once | ORAL | Status: AC
Start: 2016-08-18 — End: 2016-08-18
  Administered 2016-08-18: 2 via ORAL
  Filled 2016-08-18: qty 2

## 2016-08-18 MED ORDER — METHOCARBAMOL 500 MG PO TABS: 500.0000 mg | ORAL_TABLET | Freq: Three times a day (TID) | ORAL | 0 refills | Status: DC

## 2016-08-18 NOTE — ED Provider Notes (Signed)
[facility] DEPT Provider Note   CSN: [id number] Arrival date & time: 08/18/16  1428     History   Chief Complaint Chief Complaint  Patient presents with  . Other    rib pain    HPI Anthony [name] is a 55 y.o. male.  The history is provided by the patient. No language interpreter was used.  Chest Pain   This is a new problem. The current episode started yesterday. The problem occurs constantly. The problem has been gradually worsening. The pain is associated with movement (fall). The pain is moderate. The pain does not radiate. He has tried nothing for the symptoms. The treatment provided no relief.  Pertinent negatives for past medical history include no MI.  Procedure history is negative for cardiac catheterization.   Pt complains of pain in left ribs.  Pt fell yesterday and hit a metal bar. Past Medical History:  Diagnosis Date  . ADD (attention deficit disorder with hyperactivity)   . Anxiety   . Diabetes mellitus   . GERD (gastroesophageal reflux disease)   . Hx of colonic polyp - sessile serrated polyp 12/07/2014  . Hyperlipidemia     Patient Active Problem List   Diagnosis Date Noted  . B12 deficiency 03/20/2016  . Alcoholism ([place]) 02/03/2016  . Cellulitis of left foot 12/16/2015  . Cellulitis and abscess of foot 12/16/2015  . Puncture wound of foot 12/16/2015  . Diabetes mellitus without complication ([place]) [id number]  . Hx of colonic polyp - sessile serrated polyp 12/07/2014  . Obesity (BMI 30-39.9) 08/18/2013  . Essential hypertension 07/22/2007  . Pure hypercholesterolemia 07/11/2007  . ANXIETY 07/11/2007  . GERD 07/11/2007    Past Surgical History:  Procedure Laterality Date  . PILONIDAL CYST EXCISION         Home Medications    Prior to Admission medications   Medication Sig Start Date End Date Taking? Authorizing Provider  aspirin 81 MG tablet Take 81 mg by mouth daily.     Yes Historical Provider, MD  atorvastatin (LIPITOR) 40 MG  tablet TAKE ONE (1) TABLET EACH DAY 03/05/16  Yes Marletta Lor, MD  citalopram (CELEXA) 10 MG tablet Take 10 mg by mouth daily.   Yes Historical Provider, MD  fluticasone (FLONASE) 50 MCG/ACT nasal spray USE TWO SPRAYS IN BOTH NOSTRIL DAILY 12/26/15  Yes Marletta Lor, MD  gabapentin (NEURONTIN) 300 MG capsule Take 300 mg by mouth 2 (two) times daily.    Yes Historical Provider, MD  glipiZIDE (GLUCOTROL XL) 5 MG 24 hr tablet TAKE ONE (1) TABLET EACH DAY 03/05/16  Yes Marletta Lor, MD  hydrochlorothiazide (MICROZIDE) 12.5 MG capsule Take 1 capsule by mouth daily. 11/20/15  Yes Historical Provider, MD  hydrocortisone (ANUSOL-HC) 2.5 % rectal cream Place rectally 2 (two) times daily. 04/11/16  Yes Isla Pence, MD  ibuprofen (ADVIL,MOTRIN) 200 MG tablet Take 600 mg by mouth every 6 (six) hours as needed for mild pain.   Yes Historical Provider, MD  labetalol (NORMODYNE) 300 MG tablet Take 1 tablet (300 mg total) by mouth 2 (two) times daily. 08/08/15  Yes Marletta Lor, MD  lisinopril (PRINIVIL,ZESTRIL) 40 MG tablet TAKE ONE (1) TABLET EACH DAY 03/05/16  Yes Marletta Lor, MD  metFORMIN (GLUCOPHAGE) 1000 MG tablet TAKE ONE TABLET TWICE DAILY WITH MEALS 03/05/16  Yes Marletta Lor, MD  Multiple Vitamin (MULTIVITAMIN WITH MINERALS) TABS tablet Take 1 tablet by mouth daily. 12/18/15  Yes Estela Leonie Green, MD  ACCU-CHEK AVIVA PLUS test strip USE TO TEST BLOOD SUGAR DAILY. 08/14/16   Marletta Lor, MD  ACCU-CHEK SOFTCLIX LANCETS lancets USE TO TEST BLOOD SUGAR DAILY. 08/14/16   Marletta Lor, MD  acetaminophen (TYLENOL) 325 MG tablet Take 650 mg by mouth every 6 (six) hours as needed for moderate pain.    Historical Provider, MD  HYDROcodone-acetaminophen (NORCO/VICODIN) 5-325 MG tablet Take 2 tablets by mouth every 4 (four) hours as needed. Patient not taking: Reported on 08/18/2016 03/25/16   Fransico Meadow, PA-C    Family History Family History  Problem  Relation Age of Onset  . Brain cancer Mother   . Diabetes Father   . Diabetes Sister   . Stroke Brother   . Seizures Brother   . Colon cancer Neg Hx   . Esophageal cancer Neg Hx   . Rectal cancer Neg Hx   . Stomach cancer Neg Hx     Social History Social History  Substance Use Topics  . Smoking status: Former Smoker    Quit date: 08/24/1996  . Smokeless tobacco: Never Used  . Alcohol use 0.0 oz/week     Comment: 3-40 oz a day of beer      Allergies   Review of patient's allergies indicates no known allergies.   Review of Systems Review of Systems  Cardiovascular: Positive for chest pain.  All other systems reviewed and are negative.    Physical Exam Updated Vital Signs BP 168/86 (BP Location: Left Arm)   Pulse 90   Temp 98 F (36.7 C) (Oral)   Resp 18   Ht 5\' 8"  (1.727 m)   Wt 81.6 kg   SpO2 95%   BMI 27.37 kg/m   Physical Exam  Constitutional: He appears well-developed and well-nourished.  HENT:  Head: Normocephalic and atraumatic.  Eyes: Conjunctivae are normal.  Neck: Neck supple.  Cardiovascular: Normal rate and regular rhythm.   No murmur heard. Pulmonary/Chest: Effort normal and breath sounds normal. No respiratory distress.  Tender left ribs. Bruising,  Abdomen nontender   Abdominal: Soft. There is no tenderness.  Musculoskeletal: He exhibits no edema.  Neurological: He is alert.  Skin: Skin is warm and dry.  Psychiatric: He has a normal mood and affect.  Nursing note and vitals reviewed.    ED Treatments / Results  Labs (all labs ordered are listed, but only abnormal results are displayed) Labs Reviewed - No data to display  EKG  EKG Interpretation None       Radiology Dg Ribs Unilateral W/chest Left  Result Date: 08/18/2016 CLINICAL DATA:  [place] on LEFT side on top of a ball, lower lateral LEFT thoracic pain, pain with deep inspiration, history of smoking, diabetes EXAM: LEFT RIBS AND CHEST - 3+ VIEW COMPARISON:  Chest radiograph  03/25/2016 FINDINGS: Normal heart size, mediastinal contours and pulmonary vascularity normal. Lungs clear. No pleural effusion or pneumothorax. Multiple old LEFT fractures. Additionally, acute mildly displaced fractures of the LEFT posterior 7th rib and anterior LEFT 8th rib. IMPRESSION: Multiple old LEFT rib fractures and additional acute LEFT 7th and 8th rib fractures. Electronically Signed   By: Lavonia Dana M.D.   On: 08/18/2016 16:06    Procedures Procedures (including critical care time)  Medications Ordered in ED Medications  oxyCODONE-acetaminophen (PERCOCET/ROXICET) 5-325 MG per tablet 2 tablet (2 tablets Oral Given 08/18/16 1544)     Initial Impression / Assessment and Plan / ED Course  I have reviewed the triage vital signs and the  nursing notes.  Pertinent labs & imaging results that were available during my care of the patient were reviewed by me and considered in my medical decision making (see chart for details).  Clinical Course      Final Clinical Impressions(s) / ED Diagnoses   Final diagnoses:  None    New Prescriptions New Prescriptions   No medications on file     Fransico Meadow, PA-C 08/18/16 1652    Elnora Morrison, MD 08/20/16 1356

## 2016-08-18 NOTE — ED Provider Notes (Signed)
CARE CONTINUED FROM KAREN SOFIA, P.A.-C.  Patient is a 55 year old male who sustained a fall on yesterday August 28 and injured his left ribs. X-ray question new fractures of the left ribs. A CT scan was obtained and confirm new fractures of rib #7 and 8 on the left side. They're noted old fractures that are healed. It was also noted an old compression fracture of thoracic spine.  Patient's color is good. He speaks in complete sentences without problem. There is symmetrical rise and fall of the chest. There is tenderness to the left mid to lower rib area. There is bruising on noted of the left flank area.  I discussed the CT scan with the patient in terms which he understands. The plan at this time is for the patient to be given incentive spirometer, Robaxin for spasm pain, and Norco 7.5 for pain not improved by Tylenol. The patient will follow-up with the primary physician in the office.  FRACTURE CARE LEFT RIBS. Patient sustained a fall on yesterday and injured the left ribs. CT scan confirms fracture of rib #7 and 8. The findings were discussed with the patient in terms which he understands. We discuss the need for incentive spirometry. We discussed the possible poor outcomes. We also discussed the need to return to the emergency department if any hemoptysis, high fever, difficulty breathing, or deterioration in general condition. The patient gives permission for the incentive spirometry. He will be treated with Robaxin and Norco for pain.  Patient tolerated use of incentive spirometry without problem.   Lily Kocher, PA-C 08/18/16 1843    Tanna Furry, MD 09/03/16 [phone]

## 2016-08-18 NOTE — ED Triage Notes (Signed)
Pt says he slipped on something in his kitchen Saturday and fell hitting left ribs on his counter.  Pt says he has a bruise to left side.  Denies any SOB but says hurts to breathe.

## 2016-08-18 NOTE — Discharge Instructions (Signed)
Your x-rays and CT scans reveal new fractures of the seventh and eighth rib on your left side. Please use your incentive spirometry every 30 minutes while awake. Use Tylenol for mild discomfort. Use Norco for more severe pain. Please use the Robaxin 3 times daily for spasm pain around the fracture areas. Robaxin and Norco may cause drowsiness, please do not drive, drink alcohol, or operate shaving when taking these medications. Please see your primary physician in the office for recheck of your rib fractures and your lungs. Return to the emergency department if any emergent changes, problems, or concerns.

## 2016-08-27 ENCOUNTER — Encounter: Payer: Self-pay | Admitting: Internal Medicine

## 2016-09-18 ENCOUNTER — Ambulatory Visit (INDEPENDENT_AMBULATORY_CARE_PROVIDER_SITE_OTHER): Payer: Medicare Other | Admitting: Internal Medicine

## 2016-09-18 ENCOUNTER — Encounter: Payer: Self-pay | Admitting: Internal Medicine

## 2016-09-18 VITALS — BP 150/86 | HR 76 | Temp 98.3°F | Resp 20 | Ht 68.0 in | Wt 200.4 lb

## 2016-09-18 DIAGNOSIS — E7800 Pure hypercholesterolemia, unspecified: Secondary | ICD-10-CM | POA: Diagnosis not present

## 2016-09-18 DIAGNOSIS — E119 Type 2 diabetes mellitus without complications: Secondary | ICD-10-CM | POA: Diagnosis not present

## 2016-09-18 DIAGNOSIS — M722 Plantar fascial fibromatosis: Secondary | ICD-10-CM

## 2016-09-18 DIAGNOSIS — Z23 Encounter for immunization: Secondary | ICD-10-CM | POA: Diagnosis not present

## 2016-09-18 DIAGNOSIS — F1020 Alcohol dependence, uncomplicated: Secondary | ICD-10-CM

## 2016-09-18 DIAGNOSIS — I10 Essential (primary) hypertension: Secondary | ICD-10-CM

## 2016-09-18 MED ORDER — ATORVASTATIN CALCIUM 40 MG PO TABS: ORAL_TABLET | ORAL | 1 refills | Status: DC

## 2016-09-18 MED ORDER — CLOTRIMAZOLE 1 % EX CREA: 1.0000 "application " | TOPICAL_CREAM | Freq: Two times a day (BID) | CUTANEOUS | 0 refills | Status: DC

## 2016-09-18 NOTE — Progress Notes (Signed)
Pre visit review using our clinic review tool, if applicable. No additional management support is needed unless otherwise documented below in the visit note. 

## 2016-09-18 NOTE — Patient Instructions (Addendum)

## 2016-09-18 NOTE — Progress Notes (Signed)
Subjective:    Patient ID: Anthony [name], male    DOB: 04-Nov-1961, 55 y.o.   MRN: [id number]  HPI 55 year old patient who is seen today for follow-up.  He has a history of type 2 diabetes which has been well-controlled on oral medications.  He has a history of alcoholism. His chief complaint was heel pain on the left.  This has been off and on for the past couple months, but he has been symptom-free for the past few days. He also describes a rash in the left axilla area present for about 2 months. He has treated hypertension He has been prescribed atorvastatin, but it is unclear whether he is taking this medication. Denies any cardiopulmonary complaints  Past Medical History:  Diagnosis Date  . ADD (attention deficit disorder with hyperactivity)   . Anxiety   . Diabetes mellitus   . GERD (gastroesophageal reflux disease)   . Hx of colonic polyp - sessile serrated polyp 12/07/2014  . Hyperlipidemia      Social History   Social History  . Marital status: Single    Spouse name: N/A  . Number of children: N/A  . Years of education: N/A   Occupational History  . Not on file.   Social History Main Topics  . Smoking status: Former Smoker    Quit date: 08/24/1996  . Smokeless tobacco: Never Used  . Alcohol use 0.0 oz/week     Comment: 3-40 oz a day of beer   . Drug use: No  . Sexual activity: Not on file   Other Topics Concern  . Not on file   Social History Narrative  . No narrative on file    Past Surgical History:  Procedure Laterality Date  . PILONIDAL CYST EXCISION      Family History  Problem Relation Age of Onset  . Brain cancer Mother   . Diabetes Father   . Diabetes Sister   . Stroke Brother   . Seizures Brother   . Colon cancer Neg Hx   . Esophageal cancer Neg Hx   . Rectal cancer Neg Hx   . Stomach cancer Neg Hx     No Known Allergies  Current Outpatient Prescriptions on File Prior to Visit  Medication Sig Dispense Refill  . ACCU-CHEK AVIVA  PLUS test strip USE TO TEST BLOOD SUGAR DAILY. 100 each 4  . ACCU-CHEK SOFTCLIX LANCETS lancets USE TO TEST BLOOD SUGAR DAILY. 100 each 4  . acetaminophen (TYLENOL) 325 MG tablet Take 650 mg by mouth every 6 (six) hours as needed for moderate pain.    Marland Kitchen aspirin 81 MG tablet Take 81 mg by mouth daily.      . citalopram (CELEXA) 10 MG tablet Take 10 mg by mouth daily.    . fluticasone (FLONASE) 50 MCG/ACT nasal spray USE TWO SPRAYS IN BOTH NOSTRIL DAILY 48 g 1  . glipiZIDE (GLUCOTROL XL) 5 MG 24 hr tablet TAKE ONE (1) TABLET EACH DAY 90 tablet 1  . hydrocortisone (ANUSOL-HC) 2.5 % rectal cream Place rectally 2 (two) times daily. 30 g 0  . ibuprofen (ADVIL,MOTRIN) 200 MG tablet Take 600 mg by mouth every 6 (six) hours as needed for mild pain.    Marland Kitchen labetalol (NORMODYNE) 300 MG tablet Take 1 tablet (300 mg total) by mouth 2 (two) times daily. 180 tablet 5  . lisinopril (PRINIVIL,ZESTRIL) 40 MG tablet TAKE ONE (1) TABLET EACH DAY 90 tablet 1  . metFORMIN (GLUCOPHAGE) 1000 MG tablet TAKE ONE  TABLET TWICE DAILY WITH MEALS 180 tablet 1  . Multiple Vitamin (MULTIVITAMIN WITH MINERALS) TABS tablet Take 1 tablet by mouth daily.    Marland Kitchen gabapentin (NEURONTIN) 300 MG capsule Take 300 mg by mouth 2 (two) times daily.     . hydrochlorothiazide (MICROZIDE) 12.5 MG capsule Take 1 capsule by mouth daily.     No current facility-administered medications on file prior to visit.     BP (!) 150/86 (BP Location: Left Arm, Patient Position: Sitting, Cuff Size: Normal)   Pulse 76   Temp 98.3 F (36.8 C) (Oral)   Resp 20   Ht 5\' 8"  (1.727 m)   Wt 200 lb 6.1 oz (90.9 kg)   SpO2 98%   BMI 30.47 kg/m     Review of Systems  Constitutional: Negative for appetite change, chills, fatigue and fever.  HENT: Negative for congestion, dental problem, ear pain, hearing loss, sore throat, tinnitus, trouble swallowing and voice change.   Eyes: Negative for pain, discharge and visual disturbance.  Respiratory: Negative for  cough, chest tightness, wheezing and stridor.   Cardiovascular: Negative for chest pain, palpitations and leg swelling.  Gastrointestinal: Negative for abdominal distention, abdominal pain, blood in stool, constipation, diarrhea, nausea and vomiting.  Genitourinary: Negative for difficulty urinating, discharge, flank pain, genital sores, hematuria and urgency.  Musculoskeletal: Negative for arthralgias, back pain, gait problem, joint swelling, myalgias and neck stiffness.       Heel pain  Skin: Negative for rash.  Neurological: Negative for dizziness, syncope, speech difficulty, weakness, numbness and headaches.  Hematological: Negative for adenopathy. Does not bruise/bleed easily.  Psychiatric/Behavioral: Negative for behavioral problems and dysphoric mood. The patient is not nervous/anxious.        Objective:   Physical Exam  Constitutional: He is oriented to person, place, and time. He appears well-developed.  HENT:  Head: Normocephalic.  Right Ear: External ear normal.  Left Ear: External ear normal.  Eyes: Conjunctivae and EOM are normal.  Neck: Normal range of motion.  Cardiovascular: Normal rate and normal heart sounds.   Pulmonary/Chest: Breath sounds normal.  Abdominal: Bowel sounds are normal.  Musculoskeletal: Normal range of motion. He exhibits no edema or tenderness.  Foot exam unremarkable  Neurological: He is alert and oriented to person, place, and time.  Psychiatric: He has a normal mood and affect. His behavior is normal.          Assessment & Plan:   Diabetes mellitus.  Will check a hemoglobin A1c.  Patient is requesting a prescription for diabetic shoes.  Patient has no evidence at this time of peripheral vascular disease or diabetic neuropathy.  He has been treated for puncture wound and cellulitis of the feet in the past.  He was told he does not qualify for diabetic shoes at this time Probable plantar fasciitis.  He was given some rehabilitation exercises  to perform.  If symptoms recur.  Proper foot care discussed Alcoholism.  Total abstinence encouraged Hypertension, controlled.  Repeat blood pressure 120/74  Follow-up 6 months Check hemoglobin A1c  Nyoka Cowden

## 2016-11-04 ENCOUNTER — Encounter: Payer: Medicare Other | Admitting: Internal Medicine

## 2016-11-16 ENCOUNTER — Ambulatory Visit (INDEPENDENT_AMBULATORY_CARE_PROVIDER_SITE_OTHER): Payer: Medicare Other | Admitting: Internal Medicine

## 2016-11-16 ENCOUNTER — Encounter: Payer: Self-pay | Admitting: Internal Medicine

## 2016-11-16 VITALS — BP 180/100 | HR 88 | Temp 98.5°F | Resp 20 | Ht 67.5 in | Wt 202.4 lb

## 2016-11-16 DIAGNOSIS — E538 Deficiency of other specified B group vitamins: Secondary | ICD-10-CM | POA: Diagnosis not present

## 2016-11-16 DIAGNOSIS — Z0000 Encounter for general adult medical examination without abnormal findings: Secondary | ICD-10-CM

## 2016-11-16 DIAGNOSIS — Z86010 Personal history of colonic polyps: Secondary | ICD-10-CM | POA: Diagnosis not present

## 2016-11-16 DIAGNOSIS — I10 Essential (primary) hypertension: Secondary | ICD-10-CM | POA: Diagnosis not present

## 2016-11-16 DIAGNOSIS — F1020 Alcohol dependence, uncomplicated: Secondary | ICD-10-CM

## 2016-11-16 DIAGNOSIS — E7800 Pure hypercholesterolemia, unspecified: Secondary | ICD-10-CM

## 2016-11-16 DIAGNOSIS — E119 Type 2 diabetes mellitus without complications: Secondary | ICD-10-CM

## 2016-11-16 LAB — COMPREHENSIVE METABOLIC PANEL
ALBUMIN: 4.4 g/dL (ref 3.5–5.2)
ALT: 57 U/L — AB (ref 0–53)
AST: 59 U/L — AB (ref 0–37)
Alkaline Phosphatase: 81 U/L (ref 39–117)
BILIRUBIN TOTAL: 0.4 mg/dL (ref 0.2–1.2)
BUN: 20 mg/dL (ref 6–23)
CALCIUM: 9.8 mg/dL (ref 8.4–10.5)
CO2: 29 meq/L (ref 19–32)
CREATININE: 0.96 mg/dL (ref 0.40–1.50)
Chloride: 99 mEq/L (ref 96–112)
GFR: 86.68 mL/min (ref >60.00)
Glucose, Bld: 189 mg/dL — ABNORMAL HIGH (ref 70–99)
Potassium: 4.4 mEq/L (ref 3.5–5.1)
Sodium: 138 mEq/L (ref 135–145)
Total Protein: 7.4 g/dL (ref 6.0–8.3)

## 2016-11-16 LAB — HEMOGLOBIN A1C: Hgb A1c MFr Bld: 7.2 % — ABNORMAL HIGH (ref 4.6–6.5)

## 2016-11-16 LAB — LDL CHOLESTEROL, DIRECT: LDL DIRECT: 116 mg/dL

## 2016-11-16 LAB — LIPID PANEL
CHOL/HDL RATIO: 3
CHOLESTEROL: 251 mg/dL — AB (ref 0–200)
HDL: 98.1 mg/dL (ref >39.00)
NonHDL: 152.67
Triglycerides: 237 mg/dL — ABNORMAL HIGH (ref 0.0–149.0)
VLDL: 47.4 mg/dL — ABNORMAL HIGH (ref 0.0–40.0)

## 2016-11-16 LAB — MICROALBUMIN / CREATININE URINE RATIO
Creatinine,U: 104.6 mg/dL
MICROALB UR: 2.7 mg/dL — AB (ref 0.0–1.9)
Microalb Creat Ratio: 2.6 mg/g (ref 0.0–30.0)

## 2016-11-16 LAB — VITAMIN B12: Vitamin B-12: 115 pg/mL — ABNORMAL LOW (ref 211–911)

## 2016-11-16 LAB — CBC WITH DIFFERENTIAL/PLATELET
BASOS PCT: 0.9 % (ref 0.0–3.0)
Basophils Absolute: 0.1 10*3/uL (ref 0.0–0.1)
EOS ABS: 0.1 10*3/uL (ref 0.0–0.7)
Eosinophils Relative: 1 % (ref 0.0–5.0)
HEMATOCRIT: 39.4 % (ref 39.0–52.0)
Hemoglobin: 13.3 g/dL (ref 13.0–17.0)
LYMPHS PCT: 18.5 % (ref 12.0–46.0)
Lymphs Abs: 1.5 10*3/uL (ref 0.7–4.0)
MCHC: 33.7 g/dL (ref 30.0–36.0)
MCV: 91.5 fl (ref 78.0–100.0)
Monocytes Absolute: 0.6 10*3/uL (ref 0.1–1.0)
Monocytes Relative: 7.1 % (ref 3.0–12.0)
NEUTROS ABS: 5.7 10*3/uL (ref 1.4–7.7)
Neutrophils Relative %: 72.5 % (ref 43.0–77.0)
PLATELETS: 219 10*3/uL (ref 150.0–400.0)
RBC: 4.3 Mil/uL (ref 4.22–5.81)
RDW: 13.5 % (ref 11.5–15.5)
WBC: 7.9 10*3/uL (ref 4.0–10.5)

## 2016-11-16 LAB — TSH: TSH: 0.56 u[IU]/mL (ref 0.35–4.50)

## 2016-11-16 MED ORDER — HYDROCHLOROTHIAZIDE 25 MG PO TABS: 25.0000 mg | ORAL_TABLET | Freq: Every day | ORAL | 3 refills | Status: DC

## 2016-11-16 NOTE — Progress Notes (Signed)
Pre visit review using our clinic review tool, if applicable. No additional management support is needed unless otherwise documented below in the visit note. 

## 2016-11-16 NOTE — Patient Instructions (Addendum)
Limit your sodium (Salt) intake  Take all your medications faithfully  Continue home blood pressure monitoring.  Please return for office follow-up in 3 weeks.  Please bring your home blood pressure monitor    It is important that you exercise regularly, at least 20 minutes 3 to 4 times per week.  If you develop chest pain or shortness of breath seek  medical attention.  Discontinue all alcoholic beverages       Finding Treatment for Addiction Introduction WHAT IS ADDICTION? Addiction is a complex disease of the brain. It causes an uncontrollable (compulsive) need for a substance. You can be addicted to alcohol, illegal drugs, or prescription medicines such as painkillers. Addiction can also be a behavior, like gambling or shopping. The need for the drug or activity can become so strong that you think about it all the time. You can also become physically dependent on a substance. Addiction can change the way your brain works. Because of these changes, getting more of whatever you are addicted to becomes the most important thing to you and feels better than other activities or relationships. Addiction can lead to changes in health, behavior, emotions, relationships, and choices that affect you and everyone around you. HOW DO I KNOW IF I NEED TREATMENT FOR ADDICTION? Addiction is a progressive disease. Without treatment, addiction can get worse. Living with addiction puts you at higher risk for injury, poor health, lost employment, loss of money, and even death. You might need treatment for addiction if:  You have tried to stop or cut down, but you cannot.  Your addiction is causing physical health problems.  You find it annoying that your friends and family are concerned about your alcohol or substance use.  You feel guilty about substance abuse or a compulsive behavior.  You have lied or tried to hide your addiction.  You need a particular substance or activity to start your day or  to calm down.  You are getting in trouble at school, work, home, or with the police.  You have done something illegal to support your addiction.  You are running out of money because of your addiction.  You have no time for anything other than your addiction. WHAT TYPES OF TREATMENT ARE AVAILABLE? The treatment program that is right for you will depend on many factors, including the type of addiction you have. Treatment programs can be outpatient or inpatient. In an outpatient program, you live at home and go to work or school, but you also go to a clinic for treatment. With an inpatient program, you live and sleep at the program facility during treatment. After treatment, you might need a plan for support during recovery. Other treatment options include:  Medicine.  Some addictions may be treated with prescription medicines.  You might also need medicine to treat anxiety or depression.  Counseling and behavior therapy. Therapy can help individuals and families behave in healthier ways and relate more effectively.  Support groups. Confidential group therapy, such as a 12-step program, can help individuals and families during treatment and recovery. No single type of program is right for everyone. Many treatment programs involve a combination of education, counseling, and a 12-step, spiritually-based approach. Some treatment programs are government sponsored. They are geared for patients who do not have private insurance. Treatment programs can vary in many respects, such as:  Cost and types of insurance that are accepted.  Types of on-site medical services that are offered.  Length of stay, setting, and size.  Overall philosophy of treatment. WHAT SHOULD I CONSIDER WHEN SELECTING A TREATMENT PROGRAM? It is important to think about your individual requirements when selecting a treatment program. There are a number of things to consider, such as:  If the program is certified by the  appropriate government agency. Even private programs must be certified and employ certified professionals.  If the program is covered by your insurance. If finances are a concern, the first call you should make is to your insurance company, if you have health insurance. Ask for a list of treatment programs that are in your network, and confirm any copayments and deductibles that you may have to pay.  If you do not have insurance, or if you choose to attend a program that does not accept your insurance, discuss whether a payment plan can be set up.  If treatment is available in languages other than English, if needed.  If the program offers detoxification treatment, if needed.  If 12-step meetings are held at the center or if transport is available for patients to attend meetings at other locations.  If the program is professional, organized, and clean.  If the program meets all of your needs, including physical and cultural needs.  If the facility offers specific treatment for your particular addiction.  If support continues to be offered after you have left the program.  If your treatment plan is continually looked at to make sure you are receiving the right treatment at the right time.  If mental health counseling is part of your treatment.  If medicine is included in treatment, if needed.  If your family is included in your treatment plan and if support is offered to them throughout the treatment process.  How the treatment works to prevent relapse. WHERE ELSE CAN I GET HELP?  Your health care provider. Ask him or her to help you find addiction treatment. These discussions are confidential.  The [employer] on Alcoholism and Drug Dependence (NCADD). This group has information about treatment centers and programs for people who have an addiction and for family members.  The telephone number is 1-800-NCA-CALL (719-675-8759).  The website is  https://ncadd.org/about-ncadd/our-affiliates  The Substance Abuse and Mental Health Services Administration [facility]). This group will help you find publicly funded treatment centers, help hotlines, and counseling services near you.  The telephone number is 1-800-662-HELP 860-106-7612).  The website is www.findtreatment.[employer].com.cy In countries outside of the [place] and [place], look in [employer] for contact information for services in your area. This information is not intended to replace advice given to you by your health care provider. Make sure you discuss any questions you have with your health care provider. Document Released: 11/05/2005 Document Revised: 05/14/2016 Document Reviewed: 09/25/2014  2017 [employer]

## 2016-11-16 NOTE — Progress Notes (Signed)
Subjective:    Patient ID: Anthony [name], male    DOB: [birth year], 55 y.o.   MRN: [id number]  HPI  55 year old patient who is seen today for a annual exam. Medical problems include type 2 diabetes.  He has hypertension and dyslipidemia. He has a history of chronic alcoholism and states that he drinks almost daily, but low quantity. He has been on Medicare disability since the nineties.  He lives alone in a rented home, but does have a former mother-in-law who watches after his care.  He has been widowed since 2006 No new concerns or complaints today He did have a colonoscopy in 2015 He does claim compliance with his medications  Patient did have annual eye examination in August 2017 with [facility]  Lab Results  Component Value Date   HGBA1C 6.6 (H) 03/20/2016    Past Medical History:  Diagnosis Date  . ADD (attention deficit disorder with hyperactivity)   . Anxiety   . Diabetes mellitus   . GERD (gastroesophageal reflux disease)   . Hx of colonic polyp - sessile serrated polyp 12/07/2014  . Hyperlipidemia      Social History   Social History  . Marital status: Single    Spouse name: N/A  . Number of children: N/A  . Years of education: N/A   Occupational History  . Not on file.   Social History Main Topics  . Smoking status: Former Smoker    Quit date: 08/24/1996  . Smokeless tobacco: Never Used  . Alcohol use 0.0 oz/week     Comment: 3-40 oz a day of beer   . Drug use: No  . Sexual activity: Not on file   Other Topics Concern  . Not on file   Social History Narrative  . No narrative on file    Past Surgical History:  Procedure Laterality Date  . PILONIDAL CYST EXCISION      Family History  Problem Relation Age of Onset  . Brain cancer Mother   . Diabetes Father   . Diabetes Sister   . Stroke Brother   . Seizures Brother   . Colon cancer Neg Hx   . Esophageal cancer Neg Hx   . Rectal cancer Neg Hx   . Stomach cancer Neg Hx     No  Known Allergies  Current Outpatient Prescriptions on File Prior to Visit  Medication Sig Dispense Refill  . ACCU-CHEK AVIVA PLUS test strip USE TO TEST BLOOD SUGAR DAILY. 100 each 4  . ACCU-CHEK SOFTCLIX LANCETS lancets USE TO TEST BLOOD SUGAR DAILY. 100 each 4  . acetaminophen (TYLENOL) 325 MG tablet Take 650 mg by mouth every 6 (six) hours as needed for moderate pain.    Marland Kitchen aspirin 81 MG tablet Take 81 mg by mouth daily.      Marland Kitchen atorvastatin (LIPITOR) 40 MG tablet TAKE ONE (1) TABLET EACH DAY 90 tablet 1  . citalopram (CELEXA) 10 MG tablet Take 10 mg by mouth daily.    . clotrimazole (LOTRIMIN) 1 % cream Apply 1 application topically 2 (two) times daily. 30 g 0  . fluticasone (FLONASE) 50 MCG/ACT nasal spray USE TWO SPRAYS IN BOTH NOSTRIL DAILY 48 g 1  . gabapentin (NEURONTIN) 300 MG capsule Take 300 mg by mouth 2 (two) times daily.     Marland Kitchen glipiZIDE (GLUCOTROL XL) 5 MG 24 hr tablet TAKE ONE (1) TABLET EACH DAY 90 tablet 1  . hydrochlorothiazide (MICROZIDE) 12.5 MG capsule Take 1  capsule by mouth daily.    . hydrocortisone (ANUSOL-HC) 2.5 % rectal cream Place rectally 2 (two) times daily. 30 g 0  . ibuprofen (ADVIL,MOTRIN) 200 MG tablet Take 600 mg by mouth every 6 (six) hours as needed for mild pain.    Marland Kitchen labetalol (NORMODYNE) 300 MG tablet Take 1 tablet (300 mg total) by mouth 2 (two) times daily. 180 tablet 5  . lisinopril (PRINIVIL,ZESTRIL) 40 MG tablet TAKE ONE (1) TABLET EACH DAY 90 tablet 1  . metFORMIN (GLUCOPHAGE) 1000 MG tablet TAKE ONE TABLET TWICE DAILY WITH MEALS 180 tablet 1  . Multiple Vitamin (MULTIVITAMIN WITH MINERALS) TABS tablet Take 1 tablet by mouth daily.     No current facility-administered medications on file prior to visit.     BP (!) 180/100 (BP Location: Left Arm, Patient Position: Sitting, Cuff Size: Normal)   Pulse 88   Temp 98.5 F (36.9 C) (Oral)   Resp 20   Ht 5' 7.5" (1.715 m)   Wt 202 lb 6.1 oz (91.8 kg)   SpO2 96%   BMI 31.23 kg/m   Medicare  wellness  1. Risk factors, based on past  M,S,F history.  Cardiovascular risk factors include diabetes hypertension and dyslipidemia  2.  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  Depression/mood:history generalized anxiety disorder  4.  Hearing:no deficits  5.  ADL's:independent 6.  Fall risk:low  7.  Home safety:no problems identified  8.  Height weight, and visual acuity;-height and weight stable.  No change in visual acuity 9.  Counseling: total abstinence from all alcohol.  Discussed and encouraged.  AAA.  Discussed referral for treatment discussed and encouraged 10. Lab orders based on risk factors: we'll check laboratory update including hemoglobin A1c, urine for microalbumin and lipid profile  11. Referral :  Referral for alcohol treatment plan discussed and encouraged  12. Care plan:ontinue efforts at aggressive risk factor modification.  Compliant with his medications.  Discussed and encouraged 13. Cognitive assessment: alert and oriented.    14. Screening: Patient provided with a written and personalized 5-10 year screening schedule in the AVS.    15. Provider List Update: primary care medicine [facility]    Review of Systems  Constitutional: Negative for appetite change, chills, fatigue and fever.  HENT: Negative for congestion, dental problem, ear pain, hearing loss, sore throat, tinnitus, trouble swallowing and voice change.   Eyes: Negative for pain, discharge and visual disturbance.  Respiratory: Negative for cough, chest tightness, wheezing and stridor.   Cardiovascular: Negative for chest pain, palpitations and leg swelling.  Gastrointestinal: Negative for abdominal distention, abdominal pain, blood in stool, constipation, diarrhea, nausea and vomiting.  Genitourinary: Negative for difficulty urinating, discharge, flank pain, genital sores, hematuria and urgency.  Musculoskeletal: Negative for arthralgias,  back pain, gait problem, joint swelling, myalgias and neck stiffness.  Skin: Negative for rash.  Neurological: Negative for dizziness, syncope, speech difficulty, weakness, numbness and headaches.  Hematological: Negative for adenopathy. Does not bruise/bleed easily.  Psychiatric/Behavioral: Negative for behavioral problems and dysphoric mood. The patient is not nervous/anxious.        Objective:   Physical Exam  Constitutional: He appears well-developed and well-nourished.  Blood pressure 160/100  HENT:  Head: Normocephalic and atraumatic.  Right Ear: External ear normal.  Left Ear: External ear normal.  Nose: Nose normal.  Mouth/Throat: Oropharynx is clear and moist.  Eyes: Conjunctivae and EOM are normal. Pupils are equal, round, and reactive  to light. No scleral icterus.  Neck: Normal range of motion. Neck supple. No JVD present. No thyromegaly present.  Cardiovascular: Regular rhythm, normal heart sounds and intact distal pulses.  Exam reveals no gallop and no friction rub.   No murmur heard. Pulmonary/Chest: Effort normal and breath sounds normal. He exhibits no tenderness.  Abdominal: Soft. Bowel sounds are normal. He exhibits no distension and no mass. There is no tenderness.  Genitourinary: Prostate normal and penis normal.  Musculoskeletal: Normal range of motion. He exhibits no edema or tenderness.  Lymphadenopathy:    He has no cervical adenopathy.  Neurological: He is alert. He has normal reflexes. No cranial nerve deficit. Coordination normal.  Skin: Skin is warm and dry. No rash noted.  Psychiatric: He has a normal mood and affect. His behavior is normal.          Assessment & Plan:   Preventive health examination Medicare wellness visit Hypertension, poor control.  Will increase hydrochlorothiazide.  Continue home blood pressure monitoring.  Patient has been asked report back in 3 weeks with home blood pressure monitor Diabetes mellitus.  Will check  hemoglobin A1c, urine for microalbumin Dyslipidemia.  Check lipid profile Chronic alcoholism.  Referral for treatment again discussed and encouraged.  Follow-up AA  Encouraged   Nyoka Cowden

## 2016-11-18 ENCOUNTER — Telehealth: Payer: Self-pay | Admitting: Internal Medicine

## 2016-11-18 NOTE — Telephone Encounter (Signed)
Pt would like to see if he can take the shot that the nurse called him about that he need to take every week for 4 weeks at [facility] due to he can not afford to come to [place] to have it done every week.

## 2016-11-19 ENCOUNTER — Telehealth: Payer: Self-pay | Admitting: Internal Medicine

## 2016-11-19 NOTE — Telephone Encounter (Signed)
Spoke to [place], told her pt's Vit B12 level came back low and needs to start Vit B12 injections. Once a week for 4 weeks and then monthly. Vit B12 deficiency can cause long term problems numbness and tingling, fatigue, stomach problems. Anthony [name] verbalized understanding and said what about interaction with alcohol due to pt drinks. Told her I am not sure will check with [name] tomorrow and get back to you. Anthony [name] verbalized understanding and said can not make appt tomorrow for injection change to Monday at 10:00. Told her okay and I will get back to you. Anthony [name] verbalized understanding.

## 2016-11-19 NOTE — Telephone Encounter (Signed)
Spoke to pt, told him he can not go to [facility] to have injections because we have the medication here. Asked him if he can get Inez Catalina to bring him it is just for a quick nurse visit and after the weekly injections it will go monthly. Pt verbalized understanding. Told pt appt tomorrow at 10:00 AM but you can come anytime I will be here all day. Pt verbalized understanding and said he will call Inez Catalina and see if she will bring him. Told him okay.

## 2016-11-19 NOTE — Telephone Encounter (Signed)
Mom would like a call back to explain why pt needs to come one time week.

## 2016-11-20 NOTE — Telephone Encounter (Signed)
Spoke to [place], told him to let Inez Catalina know that I discussed the alcohol with [name] and he said it does not interfere with B12 injections. Cephus verbalized understanding and said he will let Inez Catalina know.

## 2016-11-23 ENCOUNTER — Ambulatory Visit (INDEPENDENT_AMBULATORY_CARE_PROVIDER_SITE_OTHER): Payer: Medicare Other | Admitting: *Deleted

## 2016-11-23 DIAGNOSIS — E538 Deficiency of other specified B group vitamins: Secondary | ICD-10-CM

## 2016-11-23 MED ORDER — CYANOCOBALAMIN 1000 MCG/ML IJ SOLN
1000.0000 ug | Freq: Once | INTRAMUSCULAR | Status: AC
  Administered 2016-11-23: 1000 ug via INTRAMUSCULAR

## 2016-11-30 ENCOUNTER — Ambulatory Visit (INDEPENDENT_AMBULATORY_CARE_PROVIDER_SITE_OTHER): Payer: Medicare Other | Admitting: *Deleted

## 2016-11-30 DIAGNOSIS — E538 Deficiency of other specified B group vitamins: Secondary | ICD-10-CM | POA: Diagnosis not present

## 2016-11-30 MED ORDER — CYANOCOBALAMIN 1000 MCG/ML IJ SOLN
1000.0000 ug | Freq: Once | INTRAMUSCULAR | Status: AC
  Administered 2016-11-30: 1000 ug via INTRAMUSCULAR

## 2016-12-07 ENCOUNTER — Ambulatory Visit (INDEPENDENT_AMBULATORY_CARE_PROVIDER_SITE_OTHER): Payer: Medicare Other

## 2016-12-07 DIAGNOSIS — E538 Deficiency of other specified B group vitamins: Secondary | ICD-10-CM | POA: Diagnosis not present

## 2016-12-07 MED ORDER — CYANOCOBALAMIN 1000 MCG/ML IJ SOLN
1000.0000 ug | Freq: Once | INTRAMUSCULAR | Status: AC
  Administered 2016-12-07: 1000 ug via INTRAMUSCULAR

## 2016-12-08 ENCOUNTER — Ambulatory Visit: Payer: Medicare Other

## 2016-12-08 ENCOUNTER — Encounter: Payer: Self-pay | Admitting: Internal Medicine

## 2016-12-08 ENCOUNTER — Ambulatory Visit (INDEPENDENT_AMBULATORY_CARE_PROVIDER_SITE_OTHER): Payer: Medicare Other | Admitting: Internal Medicine

## 2016-12-08 VITALS — BP 100/58 | HR 83 | Temp 98.4°F | Ht 67.25 in | Wt 197.4 lb

## 2016-12-08 DIAGNOSIS — I10 Essential (primary) hypertension: Secondary | ICD-10-CM

## 2016-12-08 DIAGNOSIS — E538 Deficiency of other specified B group vitamins: Secondary | ICD-10-CM | POA: Diagnosis not present

## 2016-12-08 DIAGNOSIS — E119 Type 2 diabetes mellitus without complications: Secondary | ICD-10-CM

## 2016-12-08 NOTE — Progress Notes (Signed)
Pre visit review using our clinic review tool, if applicable. No additional management support is needed unless otherwise documented below in the visit note. 

## 2016-12-08 NOTE — Progress Notes (Signed)
Subjective:    Patient ID: Anthony [name], male    DOB: [birth year], 55 y.o.   MRN: [id number]  HPI  55 year old patient seen today in follow-up.  Patient has a recent diagnosis of B12 deficiency and has received 3 weekly injections out of a scheduled four. He is doing quite well today.  He has essential hypertension.  He has dyslipidemia and remains on statin therapy.  He has type 2 diabetes He was seen 54 month ago and blood pressure was elevated.  Hydrochlorothiazide was increased from 12 point 5-25 mg daily.  Apparently the patient has already been taking 12 point 5 mg daily  Blood pressure today is low  BP Readings from Last 3 Encounters:  12/08/16 (!) 100/58  11/16/16 (!) 180/100  09/18/16 (!) 150/86    Lab Results  Component Value Date   HGBA1C 7.2 (H) 11/16/2016   Past Medical History:  Diagnosis Date  . ADD (attention deficit disorder with hyperactivity)   . Anxiety   . Diabetes mellitus   . GERD (gastroesophageal reflux disease)   . Hx of colonic polyp - sessile serrated polyp 12/07/2014  . Hyperlipidemia      Social History   Social History  . Marital status: Single    Spouse name: N/A  . Number of children: N/A  . Years of education: N/A   Occupational History  . Not on file.   Social History Main Topics  . Smoking status: Former Smoker    Quit date: 08/24/1996  . Smokeless tobacco: Never Used  . Alcohol use 0.0 oz/week     Comment: 3-40 oz a day of beer   . Drug use: No  . Sexual activity: Not on file   Other Topics Concern  . Not on file   Social History Narrative  . No narrative on file    Past Surgical History:  Procedure Laterality Date  . PILONIDAL CYST EXCISION      Family History  Problem Relation Age of Onset  . Brain cancer Mother   . Diabetes Father   . Diabetes Sister   . Stroke Brother   . Seizures Brother   . Colon cancer Neg Hx   . Esophageal cancer Neg Hx   . Rectal cancer Neg Hx   . Stomach cancer Neg Hx     No  Known Allergies  Current Outpatient Prescriptions on File Prior to Visit  Medication Sig Dispense Refill  . ACCU-CHEK AVIVA PLUS test strip USE TO TEST BLOOD SUGAR DAILY. 100 each 4  . ACCU-CHEK SOFTCLIX LANCETS lancets USE TO TEST BLOOD SUGAR DAILY. 100 each 4  . acetaminophen (TYLENOL) 325 MG tablet Take 650 mg by mouth every 6 (six) hours as needed for moderate pain.    Marland Kitchen aspirin 81 MG tablet Take 81 mg by mouth daily.      Marland Kitchen atorvastatin (LIPITOR) 40 MG tablet TAKE ONE (1) TABLET EACH DAY 90 tablet 1  . fluticasone (FLONASE) 50 MCG/ACT nasal spray USE TWO SPRAYS IN BOTH NOSTRIL DAILY 48 g 1  . glipiZIDE (GLUCOTROL XL) 5 MG 24 hr tablet TAKE ONE (1) TABLET EACH DAY 90 tablet 1  . ibuprofen (ADVIL,MOTRIN) 200 MG tablet Take 600 mg by mouth every 6 (six) hours as needed for mild pain.    Marland Kitchen labetalol (NORMODYNE) 300 MG tablet Take 1 tablet (300 mg total) by mouth 2 (two) times daily. 180 tablet 5  . lisinopril (PRINIVIL,ZESTRIL) 40 MG tablet TAKE ONE (1) TABLET EACH DAY  90 tablet 1  . metFORMIN (GLUCOPHAGE) 1000 MG tablet TAKE ONE TABLET TWICE DAILY WITH MEALS 180 tablet 1  . Multiple Vitamin (MULTIVITAMIN WITH MINERALS) TABS tablet Take 1 tablet by mouth daily.    . citalopram (CELEXA) 10 MG tablet Take 10 mg by mouth daily.    Marland Kitchen gabapentin (NEURONTIN) 300 MG capsule Take 300 mg by mouth 2 (two) times daily.      No current facility-administered medications on file prior to visit.     BP (!) 100/58 (BP Location: Right Arm, Patient Position: Sitting, Cuff Size: Normal)   Pulse 83   Temp 98.4 F (36.9 C) (Oral)   Ht 5' 7.25" (1.708 m)   Wt 197 lb 6.4 oz (89.5 kg)   SpO2 97%   BMI 30.69 kg/m     Review of Systems  Constitutional: Negative for appetite change, chills, fatigue and fever.  HENT: Negative for congestion, dental problem, ear pain, hearing loss, sore throat, tinnitus, trouble swallowing and voice change.   Eyes: Negative for pain, discharge and visual disturbance.    Respiratory: Negative for cough, chest tightness, wheezing and stridor.   Cardiovascular: Negative for chest pain, palpitations and leg swelling.  Gastrointestinal: Negative for abdominal distention, abdominal pain, blood in stool, constipation, diarrhea, nausea and vomiting.  Genitourinary: Negative for difficulty urinating, discharge, flank pain, genital sores, hematuria and urgency.  Musculoskeletal: Negative for arthralgias, back pain, gait problem, joint swelling, myalgias and neck stiffness.  Skin: Negative for rash.  Neurological: Negative for dizziness, syncope, speech difficulty, weakness, numbness and headaches.  Hematological: Negative for adenopathy. Does not bruise/bleed easily.  Psychiatric/Behavioral: Negative for behavioral problems and dysphoric mood. The patient is not nervous/anxious.        Objective:   Physical Exam  Constitutional: He appears well-developed and well-nourished. He appears distressed.  Blood pressure as low as 84 over 56          Assessment & Plan:  Hypotension.  Hydrochlorothiazide will be discontinued Diabetes mellitus History of B12 deficiency.  Will receive a fourth weekly injection neck week and then will continue monthly injections.  Follow-up 4 weeks  Nyoka Cowden

## 2016-12-08 NOTE — Patient Instructions (Signed)
Discontinue hydrochlorothiazide Return in 3 months for follow-up  Return in one week for a B12 injection and then monthly  Limit your sodium (Salt) intake  Please check your blood pressure on a regular basis.  If it is consistently greater than 150/90, please make an office appointment.  Call if blood pressure is consistently less than 100/70

## 2016-12-15 ENCOUNTER — Ambulatory Visit (INDEPENDENT_AMBULATORY_CARE_PROVIDER_SITE_OTHER): Payer: Medicare Other

## 2016-12-15 DIAGNOSIS — E539 Vitamin B deficiency, unspecified: Secondary | ICD-10-CM

## 2016-12-15 MED ORDER — CYANOCOBALAMIN 1000 MCG/ML IJ SOLN
1000.0000 ug | Freq: Once | INTRAMUSCULAR | Status: AC
Start: 2016-12-15 — End: 2016-12-15
  Administered 2016-12-15: 1000 ug via INTRAMUSCULAR

## 2016-12-15 NOTE — Progress Notes (Signed)
Patient had a B12 injection for Vit B12 Deficiency on 12/15/2016 at 10.40am Given by [facility] CMA

## 2017-01-19 ENCOUNTER — Ambulatory Visit (INDEPENDENT_AMBULATORY_CARE_PROVIDER_SITE_OTHER): Payer: Medicare Other

## 2017-01-19 ENCOUNTER — Ambulatory Visit: Payer: Medicare Other

## 2017-01-19 DIAGNOSIS — E538 Deficiency of other specified B group vitamins: Secondary | ICD-10-CM | POA: Diagnosis not present

## 2017-01-19 MED ORDER — CYANOCOBALAMIN 1000 MCG/ML IJ SOLN
1000.0000 ug | Freq: Once | INTRAMUSCULAR | Status: AC
  Administered 2017-01-19: 1000 ug via INTRAMUSCULAR

## 2017-02-16 ENCOUNTER — Ambulatory Visit: Payer: Medicare Other

## 2017-02-17 ENCOUNTER — Ambulatory Visit (INDEPENDENT_AMBULATORY_CARE_PROVIDER_SITE_OTHER): Payer: Medicare Other

## 2017-02-17 ENCOUNTER — Ambulatory Visit: Payer: Medicare Other

## 2017-02-17 DIAGNOSIS — E538 Deficiency of other specified B group vitamins: Secondary | ICD-10-CM | POA: Diagnosis not present

## 2017-02-17 MED ORDER — CYANOCOBALAMIN 1000 MCG/ML IJ SOLN
1000.0000 ug | Freq: Once | INTRAMUSCULAR | Status: AC
Start: 2017-02-17 — End: 2017-02-17
  Administered 2017-02-17: 1000 ug via INTRAMUSCULAR

## 2017-02-17 NOTE — Patient Instructions (Signed)
**  Coming March 12th**  Anthony [name]'s Fast Track!!!  Same Day Appointments for Acute Care: Sprains, Injuries, cuts, abrasions Colds, flu, sore throats, cough, upset stomachs Fever, ear pain Sinus and eye infections Animal/insect bites  3 Easy Ways to Schedule: Walk-In Scheduling Call in scheduling Mychart Sign-up: [email or web address]         

## 2017-02-17 NOTE — Progress Notes (Signed)
Patient got his B12 injection for his Vit B12 deficiency on 02/17/2017 at 9.20am. Given by Rosealee Albee CMA.

## 2017-03-02 ENCOUNTER — Telehealth: Payer: Self-pay | Admitting: Internal Medicine

## 2017-03-02 DIAGNOSIS — I10 Essential (primary) hypertension: Secondary | ICD-10-CM

## 2017-03-02 NOTE — Telephone Encounter (Signed)
° ° ° °  Pt Mom call and pt need to have some meds refill but he doesn't know what to tell the pharmacy to refill because he does not have his med list Would like a call back

## 2017-03-03 MED ORDER — METFORMIN HCL 1000 MG PO TABS: ORAL_TABLET | ORAL | 1 refills | Status: DC

## 2017-03-03 MED ORDER — FLUTICASONE PROPIONATE 50 MCG/ACT NA SUSP: NASAL | 1 refills | Status: DC

## 2017-03-03 MED ORDER — GLIPIZIDE ER 5 MG PO TB24: ORAL_TABLET | ORAL | 1 refills | Status: DC

## 2017-03-03 MED ORDER — ATORVASTATIN CALCIUM 40 MG PO TABS: ORAL_TABLET | ORAL | 1 refills | Status: DC

## 2017-03-03 MED ORDER — CITALOPRAM HYDROBROMIDE 10 MG PO TABS: 10.0000 mg | ORAL_TABLET | Freq: Every day | ORAL | 1 refills | Status: DC

## 2017-03-03 MED ORDER — LISINOPRIL 40 MG PO TABS: ORAL_TABLET | ORAL | 1 refills | Status: DC

## 2017-03-03 MED ORDER — LABETALOL HCL 300 MG PO TABS: 300.0000 mg | ORAL_TABLET | Freq: Two times a day (BID) | ORAL | 5 refills | Status: DC

## 2017-03-03 MED ORDER — GABAPENTIN 300 MG PO CAPS: 300.0000 mg | ORAL_CAPSULE | Freq: Two times a day (BID) | ORAL | 1 refills | Status: DC

## 2017-03-03 NOTE — Telephone Encounter (Signed)
Returned pt's mother call, she was not home. Spouse states he will have her back back when she gets home.

## 2017-03-03 NOTE — Telephone Encounter (Signed)
Spoke with pts mom and refilled all medications

## 2017-03-17 ENCOUNTER — Telehealth: Payer: Self-pay | Admitting: Internal Medicine

## 2017-03-17 ENCOUNTER — Ambulatory Visit (INDEPENDENT_AMBULATORY_CARE_PROVIDER_SITE_OTHER): Payer: Medicare Other | Admitting: Internal Medicine

## 2017-03-17 ENCOUNTER — Encounter: Payer: Self-pay | Admitting: Internal Medicine

## 2017-03-17 VITALS — BP 90/62 | HR 88 | Temp 97.9°F | Ht 67.75 in | Wt 189.6 lb

## 2017-03-17 DIAGNOSIS — E119 Type 2 diabetes mellitus without complications: Secondary | ICD-10-CM | POA: Diagnosis not present

## 2017-03-17 DIAGNOSIS — I10 Essential (primary) hypertension: Secondary | ICD-10-CM

## 2017-03-17 DIAGNOSIS — E538 Deficiency of other specified B group vitamins: Secondary | ICD-10-CM | POA: Diagnosis not present

## 2017-03-17 LAB — HEMOGLOBIN A1C: Hgb A1c MFr Bld: 7.5 % — ABNORMAL HIGH (ref 4.6–6.5)

## 2017-03-17 MED ORDER — CYANOCOBALAMIN 1000 MCG/ML IJ SOLN
1000.0000 ug | Freq: Once | INTRAMUSCULAR | Status: AC
  Administered 2017-03-17: 1000 ug via INTRAMUSCULAR

## 2017-03-17 MED ORDER — LABETALOL HCL 300 MG PO TABS: 150.0000 mg | ORAL_TABLET | Freq: Two times a day (BID) | ORAL | 5 refills | Status: DC

## 2017-03-17 NOTE — Progress Notes (Signed)
Pre visit review using our clinic review tool, if applicable. No additional management support is needed unless otherwise documented below in the visit note. 

## 2017-03-17 NOTE — Patient Instructions (Signed)
Discontinue hydrochlorothiazide if you are still taking this medication  Hold lisinopril ( Do not take at this time)  Decrease labetalol to 1 half tablet twice daily  Return in 2 weeks for follow-up

## 2017-03-17 NOTE — Telephone Encounter (Signed)
Pharmacy called for pt to follow up on request for labetalol (NORMODYNE) 300 MG tablet  Pt used to be on 1 and new rx is for .[place] wants confirmation this is correct.  [place], [place]

## 2017-03-17 NOTE — Telephone Encounter (Signed)
See message below, please advise.

## 2017-03-17 NOTE — Telephone Encounter (Signed)
Yes, patient was hypotensive today and at least for the short-term.  Labetalol was changed to 1 half tablet twice daily

## 2017-03-17 NOTE — Telephone Encounter (Signed)
Called pharmacy and informed Tech that the Rx is correct and pt is currently taking 1/2 tab.

## 2017-03-17 NOTE — Progress Notes (Signed)
Subjective:    Patient ID: Anthony [name], male    DOB: 1961-07-14, 56 y.o.   MRN: [id number]  HPI  56 year old patient who has a history of essential hypertension, type 2 diabetes as well as a fairly recent diagnosis of B12 deficiency.  He receives monthly B12 injections from the office  He was last seen in December and blood pressure was low.  Hydrochlorothiazide was discontinued.  It is unclear whether he continues to take this medication or not..  It is off his medication list  He complains of some episodic dizziness.  Blood pressure again low today  Past Medical History:  Diagnosis Date  . ADD (attention deficit disorder with hyperactivity)   . Anxiety   . Diabetes mellitus   . GERD (gastroesophageal reflux disease)   . Hx of colonic polyp - sessile serrated polyp 12/07/2014  . Hyperlipidemia      Social History   Social History  . Marital status: Single    Spouse name: N/A  . Number of children: N/A  . Years of education: N/A   Occupational History  . Not on file.   Social History Main Topics  . Smoking status: Former Smoker    Quit date: 08/24/1996  . Smokeless tobacco: Never Used  . Alcohol use 0.0 oz/week     Comment: 3-40 oz a day of beer   . Drug use: No  . Sexual activity: Not on file   Other Topics Concern  . Not on file   Social History Narrative  . No narrative on file    Past Surgical History:  Procedure Laterality Date  . PILONIDAL CYST EXCISION      Family History  Problem Relation Age of Onset  . Brain cancer Mother   . Diabetes Father   . Diabetes Sister   . Stroke Brother   . Seizures Brother   . Colon cancer Neg Hx   . Esophageal cancer Neg Hx   . Rectal cancer Neg Hx   . Stomach cancer Neg Hx     No Known Allergies  Current Outpatient Prescriptions on File Prior to Visit  Medication Sig Dispense Refill  . ACCU-CHEK AVIVA PLUS test strip USE TO TEST BLOOD SUGAR DAILY. 100 each 4  . ACCU-CHEK SOFTCLIX LANCETS lancets USE  TO TEST BLOOD SUGAR DAILY. 100 each 4  . acetaminophen (TYLENOL) 325 MG tablet Take 650 mg by mouth every 6 (six) hours as needed for moderate pain.    Marland Kitchen aspirin 81 MG tablet Take 81 mg by mouth daily.      Marland Kitchen atorvastatin (LIPITOR) 40 MG tablet TAKE ONE (1) TABLET EACH DAY 90 tablet 1  . citalopram (CELEXA) 10 MG tablet Take 1 tablet (10 mg total) by mouth daily. 90 tablet 1  . fluticasone (FLONASE) 50 MCG/ACT nasal spray USE TWO SPRAYS IN BOTH NOSTRIL DAILY 48 g 1  . gabapentin (NEURONTIN) 300 MG capsule Take 1 capsule (300 mg total) by mouth 2 (two) times daily. 180 capsule 1  . glipiZIDE (GLUCOTROL XL) 5 MG 24 hr tablet TAKE ONE (1) TABLET EACH DAY 90 tablet 1  . ibuprofen (ADVIL,MOTRIN) 200 MG tablet Take 600 mg by mouth every 6 (six) hours as needed for mild pain.    . metFORMIN (GLUCOPHAGE) 1000 MG tablet TAKE ONE TABLET TWICE DAILY WITH MEALS 180 tablet 1  . Multiple Vitamin (MULTIVITAMIN WITH MINERALS) TABS tablet Take 1 tablet by mouth daily.     No current facility-administered medications on  file prior to visit.     BP 90/62 (BP Location: Left Arm, Patient Position: Sitting, Cuff Size: Normal)   Pulse 88   Temp 97.9 F (36.6 C) (Oral)   Ht 5' 7.75" (1.721 m)   Wt 189 lb 9.6 oz (86 kg)   SpO2 92%   BMI 29.04 kg/m     Review of Systems  Constitutional: Negative for appetite change, chills, fatigue and fever.  HENT: Negative for congestion, dental problem, ear pain, hearing loss, sore throat, tinnitus, trouble swallowing and voice change.   Eyes: Negative for pain, discharge and visual disturbance.  Respiratory: Negative for cough, chest tightness, wheezing and stridor.   Cardiovascular: Negative for chest pain, palpitations and leg swelling.  Gastrointestinal: Negative for abdominal distention, abdominal pain, blood in stool, constipation, diarrhea, nausea and vomiting.  Genitourinary: Negative for difficulty urinating, discharge, flank pain, genital sores, hematuria and  urgency.  Musculoskeletal: Negative for arthralgias, back pain, gait problem, joint swelling, myalgias and neck stiffness.  Skin: Negative for rash.  Neurological: Positive for dizziness and light-headedness. Negative for syncope, speech difficulty, weakness, numbness and headaches.  Hematological: Negative for adenopathy. Does not bruise/bleed easily.  Psychiatric/Behavioral: Negative for behavioral problems and dysphoric mood. The patient is not nervous/anxious.        Objective:   Physical Exam  Constitutional: He is oriented to person, place, and time. He appears well-developed.  Blood pressure 90/60  HENT:  Head: Normocephalic.  Right Ear: External ear normal.  Left Ear: External ear normal.  Eyes: Conjunctivae and EOM are normal.  Neck: Normal range of motion.  Cardiovascular: Normal rate and normal heart sounds.   Pulmonary/Chest: Breath sounds normal.  Abdominal: Bowel sounds are normal.  Musculoskeletal: Normal range of motion. He exhibits no edema or tenderness.  Neurological: He is alert and oriented to person, place, and time.  Psychiatric: He has a normal mood and affect. His behavior is normal.          Assessment & Plan:   Hypotension.  The patient will confirm that he no longer takes hydrochlorothiazide.  Lisinopril will be placed on hold.  Labetalol will be discreased to 150 twice daily.  Recheck in 2 weeks Diabetes mellitus.  Check hemoglobin A1c B12 deficiency.  We'll continue monthly B12 injections indefinitely  Reassess 2 weeks  Nyoka Cowden

## 2017-03-22 MED ORDER — CANAGLIFLOZIN 300 MG PO TABS: 300.0000 mg | ORAL_TABLET | Freq: Every day | ORAL | 5 refills | Status: DC

## 2017-03-22 NOTE — Addendum Note (Signed)
Addended by: Abelardo Diesel on: 03/22/2017 01:32 PM   Modules accepted: Orders

## 2017-03-25 ENCOUNTER — Other Ambulatory Visit: Payer: Self-pay | Admitting: Pharmacy Technician

## 2017-03-25 NOTE — Patient Outreach (Signed)
I spoke with Anthony [name] in reference to Anthony [name] Medication adherence measures. Per our conversation Anthony [name] is currently taking his medication's (Metformin, Lisinopril, and Atorvastatin) as prescribed. We discussed if he currently has his medication's and he verified he has refills.  Doreene Burke, [place]

## 2017-04-16 ENCOUNTER — Ambulatory Visit (INDEPENDENT_AMBULATORY_CARE_PROVIDER_SITE_OTHER): Payer: Medicare Other

## 2017-04-16 DIAGNOSIS — E538 Deficiency of other specified B group vitamins: Secondary | ICD-10-CM

## 2017-04-16 MED ORDER — CYANOCOBALAMIN 1000 MCG/ML IJ SOLN
1000.0000 ug | Freq: Once | INTRAMUSCULAR | Status: AC
  Administered 2017-04-16: 1000 ug via INTRAMUSCULAR

## 2017-04-30 ENCOUNTER — Emergency Department (HOSPITAL_COMMUNITY)
Admission: EM | Admit: 2017-04-30 | Discharge: 2017-04-30 | Disposition: A | Discharge status: Home / Self Care | Payer: Medicare Other | Attending: Emergency Medicine | Admitting: Emergency Medicine

## 2017-04-30 ENCOUNTER — Encounter (HOSPITAL_COMMUNITY): Payer: Self-pay | Admitting: *Deleted

## 2017-04-30 ENCOUNTER — Emergency Department (HOSPITAL_COMMUNITY): Payer: Medicare Other

## 2017-04-30 DIAGNOSIS — Z7984 Long term (current) use of oral hypoglycemic drugs: Secondary | ICD-10-CM | POA: Insufficient documentation

## 2017-04-30 DIAGNOSIS — Y929 Unspecified place or not applicable: Secondary | ICD-10-CM | POA: Insufficient documentation

## 2017-04-30 DIAGNOSIS — Z87891 Personal history of nicotine dependence: Secondary | ICD-10-CM | POA: Insufficient documentation

## 2017-04-30 DIAGNOSIS — E119 Type 2 diabetes mellitus without complications: Secondary | ICD-10-CM | POA: Diagnosis not present

## 2017-04-30 DIAGNOSIS — S2241XA Multiple fractures of ribs, right side, initial encounter for closed fracture: Secondary | ICD-10-CM

## 2017-04-30 DIAGNOSIS — Z7982 Long term (current) use of aspirin: Secondary | ICD-10-CM | POA: Diagnosis not present

## 2017-04-30 DIAGNOSIS — Y999 Unspecified external cause status: Secondary | ICD-10-CM | POA: Insufficient documentation

## 2017-04-30 DIAGNOSIS — Y939 Activity, unspecified: Secondary | ICD-10-CM | POA: Insufficient documentation

## 2017-04-30 DIAGNOSIS — I10 Essential (primary) hypertension: Secondary | ICD-10-CM | POA: Diagnosis not present

## 2017-04-30 DIAGNOSIS — R0781 Pleurodynia: Secondary | ICD-10-CM | POA: Diagnosis not present

## 2017-04-30 DIAGNOSIS — W1830XA Fall on same level, unspecified, initial encounter: Secondary | ICD-10-CM | POA: Diagnosis not present

## 2017-04-30 DIAGNOSIS — S299XXA Unspecified injury of thorax, initial encounter: Secondary | ICD-10-CM | POA: Diagnosis not present

## 2017-04-30 MED ORDER — OXYCODONE-ACETAMINOPHEN 5-325 MG PO TABS: 2.0000 | ORAL_TABLET | ORAL | 0 refills | Status: DC | PRN

## 2017-04-30 NOTE — ED Triage Notes (Signed)
Pt says he fell about week ago, been having pain to the right ribs since.

## 2017-04-30 NOTE — ED Provider Notes (Signed)
[facility] DEPT Provider Note   CSN: [id number] Arrival date & time: 04/30/17  0017  By signing my name below, I, Anthony [name], attest that this documentation has been prepared under the direction and in the presence of physician practitioner, Betsey Holiday, Gwenyth Allegra, MD. Electronically Signed: Dora [name], Scribe. 04/30/2017. 12:31 AM.  History   Chief Complaint Chief Complaint  Patient presents with  . Rib Injury   The history is provided by the patient. No language interpreter was used.   HPI Comments: Anthony [name] is a 56 y.o. male with PMHx including DM and HLD who presents to the Emergency Department complaining of constant pain in his anterior right ribs for about one week. He states his pain is worse with movements like bending and twisting as well as with applied pressure to his anterior right ribs. No alleviating factors noted. Pain began after a fall about a week ago. He has a h/o rib fractures. Patient denies any SOB and has been using his incentive spirometer on a regular basis since onset of his rib pain. He has no other complaints at this time.  Past Medical History:  Diagnosis Date  . ADD (attention deficit disorder with hyperactivity)   . Anxiety   . Diabetes mellitus   . GERD (gastroesophageal reflux disease)   . Hx of colonic polyp - sessile serrated polyp 12/07/2014  . Hyperlipidemia     Patient Active Problem List   Diagnosis Date Noted  . B12 deficiency 03/20/2016  . Alcoholism ([place]) 02/03/2016  . Cellulitis of left foot 12/16/2015  . Cellulitis and abscess of foot 12/16/2015  . Puncture wound of foot 12/16/2015  . Diabetes mellitus without complication ([place]) 60/09/9322  . Hx of colonic polyp - sessile serrated polyp 12/07/2014  . Obesity (BMI 30-39.9) 08/18/2013  . Essential hypertension 07/22/2007  . Pure hypercholesterolemia 07/11/2007  . ANXIETY 07/11/2007  . GERD 07/11/2007    Past Surgical History:  Procedure Laterality Date  .  PILONIDAL CYST EXCISION         Home Medications    Prior to Admission medications   Medication Sig Start Date End Date Taking? Authorizing Provider  ACCU-CHEK AVIVA PLUS test strip USE TO TEST BLOOD SUGAR DAILY. 08/14/16  Yes Marletta Lor, MD  ACCU-CHEK SOFTCLIX LANCETS lancets USE TO TEST BLOOD SUGAR DAILY. 08/14/16  Yes Marletta Lor, MD  acetaminophen (TYLENOL) 325 MG tablet Take 650 mg by mouth every 6 (six) hours as needed for moderate pain.   Yes [provider]  aspirin 81 MG tablet Take 81 mg by mouth daily.     Yes [provider]  atorvastatin (LIPITOR) 40 MG tablet TAKE ONE (1) TABLET EACH DAY 03/03/17  Yes Marletta Lor, MD  canagliflozin [facility]) 300 MG TABS tablet Take 1 tablet (300 mg total) by mouth daily before breakfast. 03/22/17  Yes Marletta Lor, MD  citalopram (CELEXA) 10 MG tablet Take 1 tablet (10 mg total) by mouth daily. 03/03/17  Yes Marletta Lor, MD  fluticasone [facility]) 50 MCG/ACT nasal spray USE TWO SPRAYS IN BOTH NOSTRIL DAILY 03/03/17  Yes Marletta Lor, MD  gabapentin (NEURONTIN) 300 MG capsule Take 1 capsule (300 mg total) by mouth 2 (two) times daily. 03/03/17  Yes Marletta Lor, MD  glipiZIDE (GLUCOTROL XL) 5 MG 24 hr tablet TAKE ONE (1) TABLET EACH DAY 03/03/17  Yes Marletta Lor, MD  ibuprofen (ADVIL,MOTRIN) 200 MG tablet Take 600 mg by mouth every 6 (six) hours  as needed for mild pain.   Yes [provider]  labetalol (NORMODYNE) 300 MG tablet Take 0.5 tablets (150 mg total) by mouth 2 (two) times daily. 03/17/17  Yes Marletta Lor, MD  metFORMIN (GLUCOPHAGE) 1000 MG tablet TAKE ONE TABLET TWICE DAILY WITH MEALS 03/03/17  Yes Marletta Lor, MD  Multiple Vitamin (MULTIVITAMIN WITH MINERALS) TABS tablet Take 1 tablet by mouth daily. 12/18/15  Yes Isaac Bliss, Rayford Halsted, MD  oxyCODONE-acetaminophen (PERCOCET) 5-325 MG tablet Take 2 tablets by mouth every 4 (four)  hours as needed. 04/30/17   Orpah Greek, MD    Family History Family History  Problem Relation Age of Onset  . Brain cancer Mother   . Diabetes Father   . Diabetes Sister   . Stroke Brother   . Seizures Brother   . Colon cancer Neg Hx   . Esophageal cancer Neg Hx   . Rectal cancer Neg Hx   . Stomach cancer Neg Hx     Social History Social History  Substance Use Topics  . Smoking status: Former Smoker    Quit date: 08/24/1996  . Smokeless tobacco: Never Used  . Alcohol use 0.0 oz/week     Comment: 3-40 oz a day of beer      Allergies   Patient has no known allergies.   Review of Systems Review of Systems  Respiratory: Negative for shortness of breath.   Cardiovascular: Positive for chest pain (rib pain).  All other systems reviewed and are negative.  Physical Exam Updated Vital Signs BP (!) 146/86 (BP Location: Left Arm)   Pulse (!) 114   Temp 98.6 F (37 C) (Oral)   Resp 20   Ht 5\' 8"  (1.727 m)   Wt 185 lb (83.9 kg)   SpO2 96%   BMI 28.13 kg/m   Physical Exam  Constitutional: He is oriented to person, place, and time. He appears well-developed and well-nourished. No distress.  HENT:  Head: Normocephalic and atraumatic.  Right Ear: Hearing normal.  Left Ear: Hearing normal.  Nose: Nose normal.  Mouth/Throat: Oropharynx is clear and moist and mucous membranes are normal.  Eyes: Conjunctivae and EOM are normal. Pupils are equal, round, and reactive to light.  Neck: Normal range of motion. Neck supple.  Cardiovascular: Regular rhythm, S1 normal and S2 normal.  Exam reveals no gallop and no friction rub.   No murmur heard. Pulmonary/Chest: Effort normal and breath sounds normal. No respiratory distress. He exhibits tenderness.  Diffuse right chest wall tenderness. No crepitus.  Abdominal: Soft. Normal appearance and bowel sounds are normal. There is no hepatosplenomegaly. There is no tenderness. There is no rebound, no guarding, no tenderness at  McBurney's point and negative Murphy's sign. No hernia.  Musculoskeletal: Normal range of motion.  Neurological: He is alert and oriented to person, place, and time. He has normal strength. No cranial nerve deficit or sensory deficit. Coordination normal. GCS eye subscore is 4. GCS verbal subscore is 5. GCS motor subscore is 6.  Skin: Skin is warm, dry and intact. No rash noted. No cyanosis.  Psychiatric: He has a normal mood and affect. His speech is normal and behavior is normal. Thought content normal.  Nursing note and vitals reviewed.  ED Treatments / Results  Labs (all labs ordered are listed, but only abnormal results are displayed) Labs Reviewed - No data to display  EKG  EKG Interpretation None       Radiology Dg Ribs Unilateral W/chest Right  Result Date: 04/30/2017 CLINICAL DATA:  Acute onset of right lower anterior rib pain after fall. Initial encounter. EXAM: RIGHT RIBS AND CHEST - 3+ VIEW COMPARISON:  Chest radiograph and CT of the chest performed 08/18/2016 FINDINGS: There are minimally displaced fractures of the right anterolateral fifth through seventh ribs. The lungs are well-aerated and clear. There is no evidence of focal opacification, pleural effusion or pneumothorax. The cardiomediastinal silhouette is within normal limits. No additional osseous abnormalities are seen. IMPRESSION: Minimally displaced fractures of the right anterolateral fifth through seventh ribs. Lungs remain grossly clear. Electronically Signed   By: Garald Balding M.D.   On: 04/30/2017 01:28    Procedures Procedures (including critical care time)  DIAGNOSTIC STUDIES: Oxygen Saturation is 96% on RA, adequate by my interpretation.    COORDINATION OF CARE: 12:34 AM Discussed treatment plan with pt at bedside and pt agreed to plan.  Medications Ordered in ED Medications - No data to display   Initial Impression / Assessment and Plan / ED Course  I have reviewed the triage vital signs and  the nursing notes.  Pertinent labs & imaging results that were available during my care of the patient were reviewed by me and considered in my medical decision making (see chart for details).     Presents with right chest wall pain that began after a fall. Patient appears to be breathing comfortably. Lungs are clear. X-ray does reveal with fractures without lung contusion or pneumothorax. Patient already has an incentive spirometer he is using from previous. Fracture. Will provide analgesia, follow up with primary care doctor.  Final Clinical Impressions(s) / ED Diagnoses   Final diagnoses:  Closed fracture of multiple ribs of right side, initial encounter    New Prescriptions New Prescriptions   OXYCODONE-ACETAMINOPHEN (PERCOCET) 5-325 MG TABLET    Take 2 tablets by mouth every 4 (four) hours as needed.   I personally performed the services described in this documentation, which was scribed in my presence. The recorded information has been reviewed and is accurate.    Orpah Greek, MD 04/30/17 0157

## 2017-05-18 ENCOUNTER — Ambulatory Visit (INDEPENDENT_AMBULATORY_CARE_PROVIDER_SITE_OTHER): Payer: Medicare Other | Admitting: *Deleted

## 2017-05-18 ENCOUNTER — Encounter: Payer: Self-pay | Admitting: Adult Health

## 2017-05-18 ENCOUNTER — Ambulatory Visit (INDEPENDENT_AMBULATORY_CARE_PROVIDER_SITE_OTHER): Payer: Medicare Other | Admitting: Adult Health

## 2017-05-18 VITALS — BP 142/88 | Temp 98.0°F | Ht 68.0 in | Wt 185.1 lb

## 2017-05-18 DIAGNOSIS — E519 Thiamine deficiency, unspecified: Secondary | ICD-10-CM | POA: Diagnosis not present

## 2017-05-18 DIAGNOSIS — L03116 Cellulitis of left lower limb: Secondary | ICD-10-CM | POA: Diagnosis not present

## 2017-05-18 LAB — CBC WITH DIFFERENTIAL/PLATELET
BASOS PCT: 0.8 % (ref 0.0–3.0)
Basophils Absolute: 0.1 10*3/uL (ref 0.0–0.1)
EOS ABS: 0 10*3/uL (ref 0.0–0.7)
Eosinophils Relative: 0.2 % (ref 0.0–5.0)
HEMATOCRIT: 38 % — AB (ref 39.0–52.0)
Hemoglobin: 13.1 g/dL (ref 13.0–17.0)
LYMPHS PCT: 14.1 % (ref 12.0–46.0)
Lymphs Abs: 1.4 10*3/uL (ref 0.7–4.0)
MCHC: 34.3 g/dL (ref 30.0–36.0)
MCV: 97.8 fl (ref 78.0–100.0)
Monocytes Absolute: 0.8 10*3/uL (ref 0.1–1.0)
Monocytes Relative: 7.9 % (ref 3.0–12.0)
NEUTROS ABS: 7.9 10*3/uL — AB (ref 1.4–7.7)
Neutrophils Relative %: 77 % (ref 43.0–77.0)
PLATELETS: 210 10*3/uL (ref 150.0–400.0)
RBC: 3.89 Mil/uL — ABNORMAL LOW (ref 4.22–5.81)
RDW: 17.2 % — AB (ref 11.5–15.5)
WBC: 10.2 10*3/uL (ref 4.0–10.5)

## 2017-05-18 MED ORDER — CEPHALEXIN 500 MG PO CAPS: 500.0000 mg | ORAL_CAPSULE | Freq: Three times a day (TID) | ORAL | 0 refills | Status: AC

## 2017-05-18 MED ORDER — CYANOCOBALAMIN 1000 MCG/ML IJ SOLN
1000.0000 ug | Freq: Once | INTRAMUSCULAR | Status: AC
  Administered 2017-05-18: 1000 ug via INTRAMUSCULAR

## 2017-05-18 NOTE — Progress Notes (Signed)
Patient here for b12 injection; injection given IM to left deltoid; patient tolerated well; no s/s of reactions. At end of nurse visit patient reports swelling and pain of left foot x 4 days, assessed left foot, redness, swelling, and warmth noted, most prominent on medial ankle, no fever or any other symptoms, patient has hx of diabetes, patient reports not checking blood sugars,  scheduled patient to see provider at 1115

## 2017-05-18 NOTE — Progress Notes (Signed)
   Subjective:    Patient ID: Anthony [name], male    DOB: 1961/03/04, 56 y.o.   MRN: [id number]  HPI  56 year old male who  has a past medical history of ADD (attention deficit disorder with hyperactivity); Anxiety; Diabetes mellitus; GERD (gastroesophageal reflux disease); Hx of colonic polyp - sessile serrated polyp (12/07/2014); and Hyperlipidemia. He is a patient of Dr. Raliegh Ip who I am seeing today for the first time for an acute issue of pain and swelling in his left  foot for 4 days. He denies any trauma to the area.   Denies any pain in his calf. Pain is worse with walking and palpation.   He denies any CP or SOB   Review of Systems See HPI     Objective:   Physical Exam  Constitutional: He is oriented to person, place, and time. He appears well-developed and well-nourished. No distress.  Cardiovascular: Normal rate, regular rhythm, normal heart sounds and intact distal pulses.  Exam reveals no gallop and no friction rub.   No murmur heard. Pulmonary/Chest: Effort normal and breath sounds normal. No respiratory distress. He has no wheezes. He has no rales. He exhibits no tenderness.  Musculoskeletal: He exhibits edema and tenderness.  + 1 pitting edema, redness, warmth and pain to medial aspect of left foot. Increased redness over the medial malleolus. Normal distal pulses.   No pain, redness, or warmth to the left calf  Neurological: He is alert and oriented to person, place, and time.  Skin: He is not diaphoretic.  Nursing note and vitals reviewed.     Assessment & Plan:  1. Cellulitis of left lower extremity - Consistent with cellulitis. No concern for DVT at this time.  - Will treat with keflex. Advised elevation and Tylenol or Nsaids - cephALEXin (KEFLEX) 500 MG capsule; Take 1 capsule (500 mg total) by mouth 3 (three) times daily.  Dispense: 30 capsule; Refill: 0 - CBC with Differential/Platelet - He has a follow up appointment with his PCP in three days  Dorothyann Peng,  NP

## 2017-05-18 NOTE — Patient Instructions (Addendum)
It was great meeting you today  You appear to have an infection of the skin called Cellulitis. I have called in an antibiotic,please take this as directed.   You can take Tylenol/Motrin for pain relief.   Keep foot elevated above the heart  Follow up with Dr. Raliegh Ip on Friday    Cellulitis, Adult Cellulitis is a skin infection. The infected area is usually red and sore. This condition occurs most often in the arms and lower legs. It is very important to get treated for this condition. Follow these instructions at home:  Take over-the-counter and prescription medicines only as told by your doctor.  If you were prescribed an antibiotic medicine, take it as told by your doctor. Do not stop taking the antibiotic even if you start to feel better.  Drink enough fluid to keep your pee (urine) clear or pale yellow.  Do not touch or rub the infected area.  Raise (elevate) the infected area above the level of your heart while you are sitting or lying down.  Place warm or cold wet cloths (warm or cold compresses) on the infected area. Do this as told by your doctor.  Keep all follow-up visits as told by your doctor. This is important. These visits let your doctor make sure your infection is not getting worse. Contact a doctor if:  You have a fever.  Your symptoms do not get better after 1-2 days of treatment.  Your bone or joint under the infected area starts to hurt after the skin has healed.  Your infection comes back. This can happen in the same area or another area.  You have a swollen bump in the infected area.  You have new symptoms.  You feel ill and also have muscle aches and pains. Get help right away if:  Your symptoms get worse.  You feel very sleepy.  You throw up (vomit) or have watery poop (diarrhea) for a long time.  There are red streaks coming from the infected area.  Your red area gets larger.  Your red area turns darker. This information is not intended to  replace advice given to you by your health care provider. Make sure you discuss any questions you have with your health care provider. Document Released: 05/25/2008 Document Revised: 05/14/2016 Document Reviewed: 10/16/2015 Elsevier Interactive Patient Education  2017 [employer].

## 2017-05-21 ENCOUNTER — Ambulatory Visit: Payer: Medicare Other | Admitting: Internal Medicine

## 2017-06-09 ENCOUNTER — Encounter: Payer: Self-pay | Admitting: Internal Medicine

## 2017-06-09 ENCOUNTER — Ambulatory Visit (INDEPENDENT_AMBULATORY_CARE_PROVIDER_SITE_OTHER): Payer: Medicare Other | Admitting: Internal Medicine

## 2017-06-09 VITALS — BP 160/92 | HR 90 | Temp 98.1°F | Wt 187.4 lb

## 2017-06-09 DIAGNOSIS — I10 Essential (primary) hypertension: Secondary | ICD-10-CM | POA: Diagnosis not present

## 2017-06-09 DIAGNOSIS — E538 Deficiency of other specified B group vitamins: Secondary | ICD-10-CM

## 2017-06-09 DIAGNOSIS — E119 Type 2 diabetes mellitus without complications: Secondary | ICD-10-CM

## 2017-06-09 MED ORDER — CYANOCOBALAMIN 1000 MCG/ML IJ SOLN
1000.0000 ug | Freq: Once | INTRAMUSCULAR | Status: AC
  Administered 2017-06-09: 1000 ug via INTRAMUSCULAR

## 2017-06-09 NOTE — Progress Notes (Signed)
Subjective:    Patient ID: Anthony [name], male    DOB: [birth year], 56 y.o.   MRN: [id number]  HPI  Lab Results  Component Value Date   HGBA1C 7.5 (H) 03/17/2017   56 year old patient who is seen today for follow-up. He has been treated recently for cellulitis involving her left foot that has large resolved.  He has some residual swelling involving the left third toe but no pain.  He has completed antibiotic therapy He has diabetes He has essential hypertension.  No blood pressure medications today. He has a history of chronic alcoholism and was seen in the ED since his last visit with me with fractured ribs on the right.  He has a history multiple falls and prior rib fractures  He admits to daily alcohol use  , but "only 1 beer"  Past Medical History:  Diagnosis Date  . ADD (attention deficit disorder with hyperactivity)   . Anxiety   . Diabetes mellitus   . GERD (gastroesophageal reflux disease)   . Hx of colonic polyp - sessile serrated polyp 12/07/2014  . Hyperlipidemia      Social History   Social History  . Marital status: Single    Spouse name: N/A  . Number of children: N/A  . Years of education: N/A   Occupational History  . Not on file.   Social History Main Topics  . Smoking status: Former Smoker    Quit date: 08/24/1996  . Smokeless tobacco: Never Used  . Alcohol use 0.0 oz/week     Comment: 3-40 oz a day of beer   . Drug use: No  . Sexual activity: Not on file   Other Topics Concern  . Not on file   Social History Narrative  . No narrative on file    Past Surgical History:  Procedure Laterality Date  . PILONIDAL CYST EXCISION      Family History  Problem Relation Age of Onset  . Brain cancer Mother   . Diabetes Father   . Diabetes Sister   . Stroke Brother   . Seizures Brother   . Colon cancer Neg Hx   . Esophageal cancer Neg Hx   . Rectal cancer Neg Hx   . Stomach cancer Neg Hx     No Known Allergies  Current Outpatient  Prescriptions on File Prior to Visit  Medication Sig Dispense Refill  . ACCU-CHEK AVIVA PLUS test strip USE TO TEST BLOOD SUGAR DAILY. 100 each 4  . ACCU-CHEK SOFTCLIX LANCETS lancets USE TO TEST BLOOD SUGAR DAILY. 100 each 4  . acetaminophen (TYLENOL) 325 MG tablet Take 650 mg by mouth every 6 (six) hours as needed for moderate pain.    Marland Kitchen aspirin 81 MG tablet Take 81 mg by mouth daily.      Marland Kitchen atorvastatin (LIPITOR) 40 MG tablet TAKE ONE (1) TABLET EACH DAY 90 tablet 1  . canagliflozin (INVOKANA) 300 MG TABS tablet Take 1 tablet (300 mg total) by mouth daily before breakfast. 30 tablet 5  . citalopram (CELEXA) 10 MG tablet Take 1 tablet (10 mg total) by mouth daily. 90 tablet 1  . fluticasone (FLONASE) 50 MCG/ACT nasal spray USE TWO SPRAYS IN BOTH NOSTRIL DAILY 48 g 1  . gabapentin (NEURONTIN) 300 MG capsule Take 1 capsule (300 mg total) by mouth 2 (two) times daily. 180 capsule 1  . glipiZIDE (GLUCOTROL XL) 5 MG 24 hr tablet TAKE ONE (1) TABLET EACH DAY 90 tablet 1  . ibuprofen (  ADVIL,MOTRIN) 200 MG tablet Take 600 mg by mouth every 6 (six) hours as needed for mild pain.    Marland Kitchen labetalol (NORMODYNE) 300 MG tablet Take 0.5 tablets (150 mg total) by mouth 2 (two) times daily. 180 tablet 5  . lisinopril (PRINIVIL,ZESTRIL) 40 MG tablet     . metFORMIN (GLUCOPHAGE) 1000 MG tablet TAKE ONE TABLET TWICE DAILY WITH MEALS 180 tablet 1  . Multiple Vitamin (MULTIVITAMIN WITH MINERALS) TABS tablet Take 1 tablet by mouth daily.    Marland Kitchen oxyCODONE-acetaminophen (PERCOCET) 5-325 MG tablet Take 2 tablets by mouth every 4 (four) hours as needed. 6 tablet 0   No current facility-administered medications on file prior to visit.     BP (!) 160/92 (BP Location: Right Arm, Patient Position: Sitting, Cuff Size: Normal)   Pulse 90   Temp 98.1 F (36.7 C) (Oral)   Wt 187 lb 6.4 oz (85 kg)   SpO2 97%   BMI 28.49 kg/m      Review of Systems  Constitutional: Negative for appetite change, chills, fatigue and fever.   HENT: Negative for congestion, dental problem, ear pain, hearing loss, sore throat, tinnitus, trouble swallowing and voice change.   Eyes: Negative for pain, discharge and visual disturbance.  Respiratory: Negative for cough, chest tightness, wheezing and stridor.   Cardiovascular: Positive for chest pain. Negative for palpitations and leg swelling.  Gastrointestinal: Negative for abdominal distention, abdominal pain, blood in stool, constipation, diarrhea, nausea and vomiting.  Genitourinary: Negative for difficulty urinating, discharge, flank pain, genital sores, hematuria and urgency.  Musculoskeletal: Negative for arthralgias, back pain, gait problem, joint swelling, myalgias and neck stiffness.  Skin: Positive for color change and rash.  Neurological: Negative for dizziness, syncope, speech difficulty, weakness, numbness and headaches.  Hematological: Negative for adenopathy. Does not bruise/bleed easily.  Psychiatric/Behavioral: Negative for behavioral problems and dysphoric mood. The patient is not nervous/anxious.        Objective:   Physical Exam  Constitutional: He is oriented to person, place, and time. He appears well-developed. No distress.  Blood pressure 140/95  HENT:  Head: Normocephalic.  Right Ear: External ear normal.  Left Ear: External ear normal.  Eyes: Conjunctivae and EOM are normal.  Neck: Normal range of motion.  Cardiovascular: Normal rate, normal heart sounds and intact distal pulses.   Pulmonary/Chest: Breath sounds normal.  Abdominal: Bowel sounds are normal.  Musculoskeletal: Normal range of motion. He exhibits no edema or tenderness.  Mild soft tissue swelling involving the left third toe  Neurological: He is alert and oriented to person, place, and time.  Psychiatric: He has a normal mood and affect. His behavior is normal.          Assessment & Plan:   Hypertension.  Blood pressure up a bit today but has not taken his medications.  Has had  some recent issues with hypotension Chronic alcoholism with frequent falls history recent right rib fractures Diabetes mellitus.  Compliance with his medication.  Stressed follow-up one month with hemoglobin A1c B12 deficiency.  Continue monthly parenteral injections Status post cellulitis, left foot.  Largely resolved   Nyoka Cowden

## 2017-06-09 NOTE — Addendum Note (Signed)
Addended by: Wyvonne Lenz on: 06/09/2017 01:33 PM   Modules accepted: Orders

## 2017-06-09 NOTE — Patient Instructions (Signed)
Discontinue all alcohol use  Limit your sodium (Salt) intake  Please check your blood pressure on a regular basis.  If it is consistently greater than 150/90, please make an office appointment.   Please check your hemoglobin A1c every 3 months  Return in one month for follow-up

## 2017-06-18 ENCOUNTER — Ambulatory Visit: Payer: Medicare Other

## 2017-07-07 ENCOUNTER — Encounter: Payer: Self-pay | Admitting: Internal Medicine

## 2017-07-07 ENCOUNTER — Ambulatory Visit (INDEPENDENT_AMBULATORY_CARE_PROVIDER_SITE_OTHER): Payer: Medicare Other | Admitting: Internal Medicine

## 2017-07-07 VITALS — BP 132/88 | HR 108 | Temp 98.3°F | Wt 189.0 lb

## 2017-07-07 DIAGNOSIS — F1020 Alcohol dependence, uncomplicated: Secondary | ICD-10-CM | POA: Diagnosis not present

## 2017-07-07 DIAGNOSIS — E119 Type 2 diabetes mellitus without complications: Secondary | ICD-10-CM

## 2017-07-07 DIAGNOSIS — I10 Essential (primary) hypertension: Secondary | ICD-10-CM

## 2017-07-07 DIAGNOSIS — E538 Deficiency of other specified B group vitamins: Secondary | ICD-10-CM

## 2017-07-07 LAB — HEMOGLOBIN A1C: HEMOGLOBIN A1C: 6 % (ref 4.6–6.5)

## 2017-07-07 MED ORDER — CYANOCOBALAMIN 1000 MCG/ML IJ SOLN
1000.0000 ug | Freq: Once | INTRAMUSCULAR | Status: AC
  Administered 2017-07-07: 1000 ug via INTRAMUSCULAR

## 2017-07-07 NOTE — Progress Notes (Signed)
Subjective:    Patient ID: LENNYN BELLANCA, male    DOB: [birth year], 56 y.o.   MRN: [id number]  HPI  56 year old patient who is seen today for follow-up of multiple medical issues He has essential hypertension.  Blood pressure is up a bit last visit, but he had not taken his medications that day. He has type 2 diabetes.  Last hemoglobin A1c was over 7.  He states that he has been compliant with his medications. He has chronic alcoholism  He has B12 deficiency treated with monthly injections  Past Medical History:  Diagnosis Date  . ADD (attention deficit disorder with hyperactivity)   . Anxiety   . Diabetes mellitus   . GERD (gastroesophageal reflux disease)   . Hx of colonic polyp - sessile serrated polyp 12/07/2014  . Hyperlipidemia      Social History   Social History  . Marital status: Single    Spouse name: N/A  . Number of children: N/A  . Years of education: N/A   Occupational History  . Not on file.   Social History Main Topics  . Smoking status: Former Smoker    Quit date: 08/24/1996  . Smokeless tobacco: Never Used  . Alcohol use 0.0 oz/week     Comment: 3-40 oz a day of beer   . Drug use: No  . Sexual activity: Not on file   Other Topics Concern  . Not on file   Social History Narrative  . No narrative on file    Past Surgical History:  Procedure Laterality Date  . PILONIDAL CYST EXCISION      Family History  Problem Relation Age of Onset  . Brain cancer Mother   . Diabetes Father   . Diabetes Sister   . Stroke Brother   . Seizures Brother   . Colon cancer Neg Hx   . Esophageal cancer Neg Hx   . Rectal cancer Neg Hx   . Stomach cancer Neg Hx     No Known Allergies  Current Outpatient Prescriptions on File Prior to Visit  Medication Sig Dispense Refill  . ACCU-CHEK AVIVA PLUS test strip USE TO TEST BLOOD SUGAR DAILY. 100 each 4  . ACCU-CHEK SOFTCLIX LANCETS lancets USE TO TEST BLOOD SUGAR DAILY. 100 each 4  . acetaminophen (TYLENOL)  325 MG tablet Take 650 mg by mouth every 6 (six) hours as needed for moderate pain.    Marland Kitchen aspirin 81 MG tablet Take 81 mg by mouth daily.      Marland Kitchen atorvastatin (LIPITOR) 40 MG tablet TAKE ONE (1) TABLET EACH DAY 90 tablet 1  . canagliflozin (INVOKANA) 300 MG TABS tablet Take 1 tablet (300 mg total) by mouth daily before breakfast. 30 tablet 5  . citalopram (CELEXA) 10 MG tablet Take 1 tablet (10 mg total) by mouth daily. 90 tablet 1  . fluticasone (FLONASE) 50 MCG/ACT nasal spray USE TWO SPRAYS IN BOTH NOSTRIL DAILY 48 g 1  . gabapentin (NEURONTIN) 300 MG capsule Take 1 capsule (300 mg total) by mouth 2 (two) times daily. 180 capsule 1  . glipiZIDE (GLUCOTROL XL) 5 MG 24 hr tablet TAKE ONE (1) TABLET EACH DAY 90 tablet 1  . ibuprofen (ADVIL,MOTRIN) 200 MG tablet Take 600 mg by mouth every 6 (six) hours as needed for mild pain.    Marland Kitchen labetalol (NORMODYNE) 300 MG tablet Take 0.5 tablets (150 mg total) by mouth 2 (two) times daily. 180 tablet 5  . lisinopril (PRINIVIL,ZESTRIL) 40 MG tablet     .  metFORMIN (GLUCOPHAGE) 1000 MG tablet TAKE ONE TABLET TWICE DAILY WITH MEALS 180 tablet 1  . Multiple Vitamin (MULTIVITAMIN WITH MINERALS) TABS tablet Take 1 tablet by mouth daily.    Marland Kitchen oxyCODONE-acetaminophen (PERCOCET) 5-325 MG tablet Take 2 tablets by mouth every 4 (four) hours as needed. 6 tablet 0   No current facility-administered medications on file prior to visit.     BP 132/88   Pulse (!) 108   Temp 98.3 F (36.8 C)   Wt 189 lb (85.7 kg)   SpO2 98%   BMI 28.74 kg/m     Review of Systems  Constitutional: Negative for appetite change, chills, fatigue and fever.  HENT: Negative for congestion, dental problem, ear pain, hearing loss, sore throat, tinnitus, trouble swallowing and voice change.   Eyes: Negative for pain, discharge and visual disturbance.  Respiratory: Negative for cough, chest tightness, wheezing and stridor.   Cardiovascular: Negative for chest pain, palpitations and leg  swelling.  Gastrointestinal: Negative for abdominal distention, abdominal pain, blood in stool, constipation, diarrhea, nausea and vomiting.  Genitourinary: Negative for difficulty urinating, discharge, flank pain, genital sores, hematuria and urgency.  Musculoskeletal: Negative for arthralgias, back pain, gait problem, joint swelling, myalgias and neck stiffness.       Persistent soft tissue swelling left third toe following cellulitis  Skin: Negative for rash.  Neurological: Negative for dizziness, syncope, speech difficulty, weakness, numbness and headaches.  Hematological: Negative for adenopathy. Does not bruise/bleed easily.  Psychiatric/Behavioral: Negative for behavioral problems and dysphoric mood. The patient is not nervous/anxious.        Objective:   Physical Exam  Constitutional: He is oriented to person, place, and time. He appears well-developed.  Blood pressure 110/74  HENT:  Head: Normocephalic.  Right Ear: External ear normal.  Left Ear: External ear normal.  Eyes: Conjunctivae and EOM are normal.  Neck: Normal range of motion.  Cardiovascular: Normal rate and normal heart sounds.   Pulmonary/Chest: Breath sounds normal.  Abdominal: Bowel sounds are normal.  Musculoskeletal: Normal range of motion. He exhibits no edema or tenderness.  Neurological: He is alert and oriented to person, place, and time.  Skin:  Soft tissue swelling without erythema or pain involving the left third distal toe  Psychiatric: He has a normal mood and affect. His behavior is normal.          Assessment & Plan:   Essential hypertension, stable Diabetes mellitus.  Will check a hemoglobin A1c Vitamin B12 deficiency.  Monthly injection given Chronic alcoholism  Follow-up 3 months  KWIATKOWSKI,PETER Pilar Plate

## 2017-07-07 NOTE — Patient Instructions (Signed)
Limit your sodium (Salt) intake   Please check your hemoglobin A1c every 3 months    It is important that you exercise regularly, at least 20 minutes 3 to 4 times per week.  If you develop chest pain or shortness of breath seek  medical attention.  Avoid all alcoholic beverages completely

## 2017-08-04 DIAGNOSIS — H524 Presbyopia: Secondary | ICD-10-CM | POA: Diagnosis not present

## 2017-08-04 DIAGNOSIS — H5203 Hypermetropia, bilateral: Secondary | ICD-10-CM | POA: Diagnosis not present

## 2017-08-04 DIAGNOSIS — E119 Type 2 diabetes mellitus without complications: Secondary | ICD-10-CM | POA: Diagnosis not present

## 2017-08-06 ENCOUNTER — Ambulatory Visit (INDEPENDENT_AMBULATORY_CARE_PROVIDER_SITE_OTHER): Payer: Medicare Other

## 2017-08-06 DIAGNOSIS — E538 Deficiency of other specified B group vitamins: Secondary | ICD-10-CM

## 2017-08-06 MED ORDER — CYANOCOBALAMIN 1000 MCG/ML IJ SOLN
1000.0000 ug | Freq: Once | INTRAMUSCULAR | Status: AC
  Administered 2017-08-06: 1000 ug via INTRAMUSCULAR

## 2017-09-09 ENCOUNTER — Other Ambulatory Visit: Payer: Self-pay | Admitting: Internal Medicine

## 2017-09-10 ENCOUNTER — Ambulatory Visit (INDEPENDENT_AMBULATORY_CARE_PROVIDER_SITE_OTHER): Payer: Medicare Other

## 2017-09-10 DIAGNOSIS — E538 Deficiency of other specified B group vitamins: Secondary | ICD-10-CM

## 2017-09-10 MED ORDER — CYANOCOBALAMIN 1000 MCG/ML IJ SOLN
1000.0000 ug | Freq: Once | INTRAMUSCULAR | Status: AC
  Administered 2017-09-10: 1000 ug via INTRAMUSCULAR

## 2017-10-07 ENCOUNTER — Encounter: Payer: Self-pay | Admitting: Internal Medicine

## 2017-10-07 ENCOUNTER — Ambulatory Visit (INDEPENDENT_AMBULATORY_CARE_PROVIDER_SITE_OTHER): Payer: Medicare Other | Admitting: Internal Medicine

## 2017-10-07 VITALS — BP 142/80 | HR 67 | Temp 98.3°F | Ht 68.0 in | Wt 186.2 lb

## 2017-10-07 DIAGNOSIS — E538 Deficiency of other specified B group vitamins: Secondary | ICD-10-CM

## 2017-10-07 DIAGNOSIS — I10 Essential (primary) hypertension: Secondary | ICD-10-CM | POA: Diagnosis not present

## 2017-10-07 DIAGNOSIS — E119 Type 2 diabetes mellitus without complications: Secondary | ICD-10-CM | POA: Diagnosis not present

## 2017-10-07 DIAGNOSIS — F1020 Alcohol dependence, uncomplicated: Secondary | ICD-10-CM | POA: Diagnosis not present

## 2017-10-07 DIAGNOSIS — Z23 Encounter for immunization: Secondary | ICD-10-CM | POA: Diagnosis not present

## 2017-10-07 LAB — HEMOGLOBIN A1C: Hgb A1c MFr Bld: 4.8 % (ref 4.6–6.5)

## 2017-10-07 MED ORDER — CYANOCOBALAMIN 1000 MCG/ML IJ SOLN
1000.0000 ug | Freq: Once | INTRAMUSCULAR | Status: AC
  Administered 2017-10-07: 1000 ug via INTRAMUSCULAR

## 2017-10-07 NOTE — Progress Notes (Signed)
Subjective:    Patient ID: Anthony [name], male    DOB: 07-14-1961, 56 y.o.   MRN: [id number]  HPI 56 year old patient who is seen today for follow-up of a number of issues. He has type 2 diabetes which more recently has been very well controlled.  His last hemoglobin A1c had improved to 6.0. He has essential hypertension.  Blood pressure well controlled today. He has a history of B12 deficiency and receives monthly B12 injections.  This was administered today  He has chronic alcoholism.  He states that he "has cut way back".  He looks well and his weight is down modestly.  He does admit to overeating  Past Medical History:  Diagnosis Date  . ADD (attention deficit disorder with hyperactivity)   . Anxiety   . Diabetes mellitus   . GERD (gastroesophageal reflux disease)   . Hx of colonic polyp - sessile serrated polyp 12/07/2014  . Hyperlipidemia      Social History   Social History  . Marital status: Single    Spouse name: N/A  . Number of children: N/A  . Years of education: N/A   Occupational History  . Not on file.   Social History Main Topics  . Smoking status: Former Smoker    Quit date: 08/24/1996  . Smokeless tobacco: Never Used  . Alcohol use 0.0 oz/week     Comment: 3-40 oz a day of beer   . Drug use: No  . Sexual activity: Not on file   Other Topics Concern  . Not on file   Social History Narrative  . No narrative on file    Past Surgical History:  Procedure Laterality Date  . PILONIDAL CYST EXCISION      Family History  Problem Relation Age of Onset  . Brain cancer Mother   . Diabetes Father   . Diabetes Sister   . Stroke Brother   . Seizures Brother   . Colon cancer Neg Hx   . Esophageal cancer Neg Hx   . Rectal cancer Neg Hx   . Stomach cancer Neg Hx     No Known Allergies  Current Outpatient Prescriptions on File Prior to Visit  Medication Sig Dispense Refill  . ACCU-CHEK AVIVA PLUS test strip USE TO TEST BLOOD SUGAR DAILY. 100  each 4  . ACCU-CHEK SOFTCLIX LANCETS lancets USE TO TEST BLOOD SUGAR DAILY. 100 each 4  . acetaminophen (TYLENOL) 325 MG tablet Take 650 mg by mouth every 6 (six) hours as needed for moderate pain.    Marland Kitchen aspirin 81 MG tablet Take 81 mg by mouth daily.      Marland Kitchen atorvastatin (LIPITOR) 40 MG tablet TAKE ONE (1) TABLET EACH DAY 90 tablet 3  . canagliflozin (INVOKANA) 300 MG TABS tablet Take 1 tablet (300 mg total) by mouth daily before breakfast. 30 tablet 5  . citalopram (CELEXA) 10 MG tablet TAKE ONE (1) TABLET EACH DAY 90 tablet 3  . fluticasone (FLONASE) 50 MCG/ACT nasal spray USE TWO SPRAYS IN BOTH NOSTRIL DAILY 48 g 1  . gabapentin (NEURONTIN) 300 MG capsule TAKE ONE CAPSULE BY MOUTH TWICE A DAY 180 capsule 3  . GLIPIZIDE XL 5 MG 24 hr tablet TAKE ONE (1) TABLET EACH DAY 90 tablet 3  . ibuprofen (ADVIL,MOTRIN) 200 MG tablet Take 600 mg by mouth every 6 (six) hours as needed for mild pain.    Marland Kitchen labetalol (NORMODYNE) 300 MG tablet Take 0.5 tablets (150 mg total) by mouth 2 (  two) times daily. 180 tablet 5  . metFORMIN (GLUCOPHAGE) 1000 MG tablet TAKE ONE TABLET TWICE DAILY WITH MEALS 180 tablet 3  . Multiple Vitamin (MULTIVITAMIN WITH MINERALS) TABS tablet Take 1 tablet by mouth daily.    Marland Kitchen oxyCODONE-acetaminophen (PERCOCET) 5-325 MG tablet Take 2 tablets by mouth every 4 (four) hours as needed. 6 tablet 0   No current facility-administered medications on file prior to visit.     BP (!) 142/80 (BP Location: Left Arm, Patient Position: Sitting, Cuff Size: Normal)   Pulse 67   Temp 98.3 F (36.8 C) (Oral)   Ht 5\' 8"  (1.727 m)   Wt 186 lb 3.2 oz (84.5 kg)   SpO2 98%   BMI 28.31 kg/m      Review of Systems  Constitutional: Negative for appetite change, chills, fatigue and fever.  HENT: Negative for congestion, dental problem, ear pain, hearing loss, sore throat, tinnitus, trouble swallowing and voice change.   Eyes: Negative for pain, discharge and visual disturbance.  Respiratory:  Negative for cough, chest tightness, wheezing and stridor.   Cardiovascular: Negative for chest pain, palpitations and leg swelling.  Gastrointestinal: Negative for abdominal distention, abdominal pain, blood in stool, constipation, diarrhea, nausea and vomiting.  Genitourinary: Negative for difficulty urinating, discharge, flank pain, genital sores, hematuria and urgency.  Musculoskeletal: Negative for arthralgias, back pain, gait problem, joint swelling, myalgias and neck stiffness.  Skin: Negative for rash.  Neurological: Negative for dizziness, syncope, speech difficulty, weakness, numbness and headaches.  Hematological: Negative for adenopathy. Does not bruise/bleed easily.  Psychiatric/Behavioral: Negative for behavioral problems and dysphoric mood. The patient is not nervous/anxious.        Objective:   Physical Exam  Constitutional: He appears well-developed and well-nourished.  HENT:  Head: Normocephalic and atraumatic.  Right Ear: External ear normal.  Left Ear: External ear normal.  Nose: Nose normal.  Mouth/Throat: Oropharynx is clear and moist.  Eyes: Pupils are equal, round, and reactive to light. Conjunctivae and EOM are normal. No scleral icterus.  Neck: Normal range of motion. Neck supple. No JVD present. No thyromegaly present.  Cardiovascular: Regular rhythm, normal heart sounds and intact distal pulses.  Exam reveals no gallop and no friction rub.   No murmur heard. Pulmonary/Chest: Effort normal and breath sounds normal. He exhibits no tenderness.  Abdominal: Soft. Bowel sounds are normal. He exhibits no distension and no mass. There is no tenderness.  Genitourinary: Prostate normal and penis normal.  Musculoskeletal: Normal range of motion. He exhibits no edema or tenderness.  Lymphadenopathy:    He has no cervical adenopathy.  Neurological: He is alert. He has normal reflexes. No cranial nerve deficit. Coordination normal.  Skin: Skin is warm and dry. No rash  noted.  Psychiatric: He has a normal mood and affect. His behavior is normal.          Assessment & Plan:   Diabetes mellitus.  Appears to be well controlled.  Will recheck a hemoglobin A1c Hypertension, controlled B12 deficiency.  Monthly B12 injection administered Preventive health.  Flu vaccine administered Chronic alcoholism.  Total alcohol cessation encouraged  Follow-up 4 months  KWIATKOWSKI,PETER Pilar Plate

## 2017-10-07 NOTE — Patient Instructions (Addendum)
Limit your sodium (Salt) intake   Please check your hemoglobin A1c every 3-6 months    It is important that you exercise regularly, at least 20 minutes 3 to 4 times per week.  If you develop chest pain or shortness of breath seek  medical attention.  Continue your monthly B12 injectionsright

## 2017-10-27 ENCOUNTER — Telehealth: Payer: Self-pay | Admitting: Internal Medicine

## 2017-10-27 ENCOUNTER — Encounter: Payer: Self-pay | Admitting: Family Medicine

## 2017-10-27 ENCOUNTER — Ambulatory Visit (INDEPENDENT_AMBULATORY_CARE_PROVIDER_SITE_OTHER): Payer: Medicare Other | Admitting: Family Medicine

## 2017-10-27 VITALS — BP 138/87 | HR 74 | Temp 98.4°F | Ht 68.0 in | Wt 191.0 lb

## 2017-10-27 DIAGNOSIS — S90414A Abrasion, right lesser toe(s), initial encounter: Secondary | ICD-10-CM | POA: Diagnosis not present

## 2017-10-27 DIAGNOSIS — L03039 Cellulitis of unspecified toe: Secondary | ICD-10-CM | POA: Diagnosis not present

## 2017-10-27 DIAGNOSIS — S90415A Abrasion, left lesser toe(s), initial encounter: Secondary | ICD-10-CM

## 2017-10-27 MED ORDER — CEPHALEXIN 500 MG PO CAPS: 500.0000 mg | ORAL_CAPSULE | Freq: Three times a day (TID) | ORAL | 0 refills | Status: AC

## 2017-10-27 NOTE — Patient Instructions (Signed)
WE NOW OFFER   [facility] FAST TRACK!!!  SAME DAY Appointments for ACUTE CARE  Such as: Sprains, Injuries, cuts, abrasions, rashes, muscle pain, joint pain, back pain Colds, flu, sore throats, headache, allergies, cough, fever  Ear pain, sinus and eye infections Abdominal pain, nausea, vomiting, diarrhea, upset stomach Animal/insect bites  3 Easy Ways to Schedule: Walk-In Scheduling Call in scheduling Mychart Sign-up: [email or web address]         

## 2017-10-27 NOTE — Telephone Encounter (Signed)
Per pharm the abx was only enough for  7 day supply not 10 day .

## 2017-10-27 NOTE — Progress Notes (Signed)
   Subjective:    Patient ID: Anthony [name], male    DOB: Sep 09, 1961, 56 y.o.   MRN: [id number]  HPI Here to check both great toes after injuries that occurred 6 days ago. He was hauling a load of dirt at home with a wheel barrow in tight shoes with no socks on, and when he finished he noticed his great toes were bluish and swollen. He has had very little pain since then. However the toes remain swollen and are now red in color.    Review of Systems  Constitutional: Negative.   Respiratory: Negative.   Cardiovascular: Negative.   Skin: Positive for color change.       Objective:   Physical Exam  Constitutional: He appears well-developed and well-nourished.  Cardiovascular: Normal rate, regular rhythm, normal heart sounds and intact distal pulses.  Pulmonary/Chest: Effort normal and breath sounds normal. No respiratory distress. He has no wheezes. He has no rales.  Musculoskeletal:  Both great toes are swollen and have black or blue nail color. The skin around the nails is red and warm but not tender. The nails are not loose          Assessment & Plan:  He has bruising on the toes and now has an early cellulitis. Treat with Keflex for 7 days. Recheck prn. Alysia Penna, MD

## 2017-10-28 NOTE — Telephone Encounter (Signed)
Tammy called yesterday and today about Mr. Hauk and his Rx for Cephalexin 500 MG was written for take 1 cap three times daily for 10 day and disp amount is 21 which is enough for 7 days but Rx was written out for 10 days. Should they change the directions for 7 days or increase the disp amount from 21 to 30 for a 10 day supply?  Call back at [phone]

## 2017-11-01 NOTE — Telephone Encounter (Signed)
7 days will be fine

## 2017-11-02 NOTE — Telephone Encounter (Signed)
Called and spoke to Tammy last Friday 10/29/2017. Done. Provider wanted 7 day supply

## 2017-11-09 ENCOUNTER — Ambulatory Visit (INDEPENDENT_AMBULATORY_CARE_PROVIDER_SITE_OTHER): Payer: Medicare Other

## 2017-11-09 DIAGNOSIS — E538 Deficiency of other specified B group vitamins: Secondary | ICD-10-CM | POA: Diagnosis not present

## 2017-11-09 MED ORDER — CYANOCOBALAMIN 1000 MCG/ML IJ SOLN
1000.0000 ug | Freq: Once | INTRAMUSCULAR | Status: AC
  Administered 2017-11-09: 1000 ug via INTRAMUSCULAR

## 2017-12-07 ENCOUNTER — Ambulatory Visit (INDEPENDENT_AMBULATORY_CARE_PROVIDER_SITE_OTHER): Payer: Medicare Other | Admitting: *Deleted

## 2017-12-07 DIAGNOSIS — E538 Deficiency of other specified B group vitamins: Secondary | ICD-10-CM | POA: Diagnosis not present

## 2017-12-07 MED ORDER — CYANOCOBALAMIN 1000 MCG/ML IJ SOLN
1000.0000 ug | Freq: Once | INTRAMUSCULAR | Status: AC
  Administered 2017-12-07: 1000 ug via INTRAMUSCULAR

## 2017-12-07 NOTE — Progress Notes (Signed)
Patient here for monthly B12 injection.  Last injection 11/09/17.  Patient tolerated injection well.  Varney Daily, CMA

## 2018-01-05 ENCOUNTER — Encounter: Payer: Self-pay | Admitting: Internal Medicine

## 2018-01-10 ENCOUNTER — Telehealth: Payer: Self-pay | Admitting: Internal Medicine

## 2018-01-10 NOTE — Telephone Encounter (Signed)
Pt's mother in law Inez Catalina [phone]   Called in to receive clarity. She said that she just noticed pt's bottle for 2 medications.   1. Invokana  2. Oxycodone   She said that pt has not been taking medications, she's not sure if she should call in a refill? If pt is suppose to continue medicines?    Please advise.

## 2018-01-11 NOTE — Telephone Encounter (Signed)
Please advise 

## 2018-01-12 NOTE — Telephone Encounter (Signed)
Please notify patient/family that he should be taking Invokana once daily Oxycodone is a pain medication prescribed in the ED last spring and may be taken to his pharmacy to discard the medication

## 2018-01-13 ENCOUNTER — Other Ambulatory Visit: Payer: Self-pay | Admitting: Internal Medicine

## 2018-01-13 ENCOUNTER — Ambulatory Visit (INDEPENDENT_AMBULATORY_CARE_PROVIDER_SITE_OTHER): Payer: Medicare Other

## 2018-01-13 DIAGNOSIS — E538 Deficiency of other specified B group vitamins: Secondary | ICD-10-CM

## 2018-01-13 MED ORDER — CYANOCOBALAMIN 1000 MCG/ML IJ SOLN
1000.0000 ug | Freq: Once | INTRAMUSCULAR | Status: AC
  Administered 2018-01-13: 1000 ug via INTRAMUSCULAR

## 2018-01-13 NOTE — Progress Notes (Signed)
Patient here for monthly B12 injection.  Last injection 12/07/18 Patient tolerated injection well.  Celso Amy

## 2018-01-13 NOTE — Telephone Encounter (Signed)
Spoke with pt's mother in law and made her aware.

## 2018-01-29 ENCOUNTER — Emergency Department (HOSPITAL_COMMUNITY): Payer: Medicare Other

## 2018-01-29 ENCOUNTER — Encounter (HOSPITAL_COMMUNITY): Payer: Self-pay | Admitting: Emergency Medicine

## 2018-01-29 ENCOUNTER — Emergency Department (HOSPITAL_COMMUNITY)
Admission: EM | Admit: 2018-01-29 | Discharge: 2018-01-29 | Disposition: A | Discharge status: Home / Self Care | Payer: Medicare Other | Attending: Emergency Medicine | Admitting: Emergency Medicine

## 2018-01-29 DIAGNOSIS — F909 Attention-deficit hyperactivity disorder, unspecified type: Secondary | ICD-10-CM | POA: Insufficient documentation

## 2018-01-29 DIAGNOSIS — M7731 Calcaneal spur, right foot: Secondary | ICD-10-CM | POA: Diagnosis not present

## 2018-01-29 DIAGNOSIS — Z7984 Long term (current) use of oral hypoglycemic drugs: Secondary | ICD-10-CM | POA: Diagnosis not present

## 2018-01-29 DIAGNOSIS — Z79899 Other long term (current) drug therapy: Secondary | ICD-10-CM | POA: Diagnosis not present

## 2018-01-29 DIAGNOSIS — M79671 Pain in right foot: Secondary | ICD-10-CM | POA: Diagnosis not present

## 2018-01-29 DIAGNOSIS — Z7982 Long term (current) use of aspirin: Secondary | ICD-10-CM | POA: Diagnosis not present

## 2018-01-29 DIAGNOSIS — I10 Essential (primary) hypertension: Secondary | ICD-10-CM | POA: Diagnosis not present

## 2018-01-29 DIAGNOSIS — E119 Type 2 diabetes mellitus without complications: Secondary | ICD-10-CM | POA: Insufficient documentation

## 2018-01-29 DIAGNOSIS — Z87891 Personal history of nicotine dependence: Secondary | ICD-10-CM | POA: Diagnosis not present

## 2018-01-29 HISTORY — DX: Essential (primary) hypertension: I10

## 2018-01-29 NOTE — ED Triage Notes (Signed)
Pt reports right heel pain for 1 week with some slight swelling.  No injury.

## 2018-01-29 NOTE — ED Provider Notes (Signed)
[facility] EMERGENCY DEPARTMENT Provider Note   CSN: [id number] Arrival date & time: 01/29/18  0725     History   Chief Complaint Chief Complaint  Patient presents with  . Foot Pain    HPI Anthony [name] is a 57 y.o. male.  HPI  Pt was seen at 0740. Per pt, c/o gradual onset and persistence of constant right heel "pain" for the past 1 week. Pt describes the pain as "aching," worsens with palpation of the area and walking. Denies injury, no rash, no focal motor weakness, no tingling/numbness in extremities, no fevers.    Past Medical History:  Diagnosis Date  . ADD (attention deficit disorder with hyperactivity)   . Anxiety   . Diabetes mellitus   . GERD (gastroesophageal reflux disease)   . Hx of colonic polyp - sessile serrated polyp 12/07/2014  . Hyperlipidemia   . Hypertension     Patient Active Problem List   Diagnosis Date Noted  . B12 deficiency 03/20/2016  . Alcoholism ([place]) 02/03/2016  . Cellulitis of left foot 12/16/2015  . Cellulitis and abscess of foot 12/16/2015  . Puncture wound of foot 12/16/2015  . Diabetes mellitus without complication ([place]) 69/67/8938  . Hx of colonic polyp - sessile serrated polyp 12/07/2014  . Obesity (BMI 30-39.9) 08/18/2013  . Essential hypertension 07/22/2007  . Pure hypercholesterolemia 07/11/2007  . ANXIETY 07/11/2007  . GERD 07/11/2007    Past Surgical History:  Procedure Laterality Date  . PILONIDAL CYST EXCISION         Home Medications    Prior to Admission medications   Medication Sig Start Date End Date Taking? Authorizing Provider  ACCU-CHEK AVIVA PLUS test strip USE TO TEST BLOOD SUGAR DAILY. 08/14/16   Marletta Lor, MD  ACCU-CHEK SOFTCLIX LANCETS lancets USE TO TEST BLOOD SUGAR DAILY. 08/14/16   Marletta Lor, MD  acetaminophen (TYLENOL) 325 MG tablet Take 650 mg by mouth every 6 (six) hours as needed for moderate pain.    [provider]  aspirin 81 MG tablet Take 81 mg by mouth  daily.      [provider]  atorvastatin (LIPITOR) 40 MG tablet TAKE ONE (1) TABLET EACH DAY 09/10/17   Marletta Lor, MD  canagliflozin [facility]) 300 MG TABS tablet Take 1 tablet (300 mg total) by mouth daily before breakfast. 03/22/17   Marletta Lor, MD  citalopram (CELEXA) 10 MG tablet TAKE ONE (1) TABLET EACH DAY 09/10/17   Marletta Lor, MD  fluticasone (FLONASE) 50 MCG/ACT nasal spray USE 2 SPRAYS IN BOTH NOSTRILS ONCE DAILY. SHAKE WELL BEFORE EACH USE. 01/13/18   Marletta Lor, MD  gabapentin (NEURONTIN) 300 MG capsule TAKE ONE CAPSULE BY MOUTH TWICE A DAY 09/10/17   Marletta Lor, MD  GLIPIZIDE XL 5 MG 24 hr tablet TAKE ONE (1) TABLET EACH DAY 09/10/17   Marletta Lor, MD  ibuprofen (ADVIL,MOTRIN) 200 MG tablet Take 600 mg by mouth every 6 (six) hours as needed for mild pain.    [provider]  labetalol (NORMODYNE) 300 MG tablet Take 0.5 tablets (150 mg total) by mouth 2 (two) times daily. 03/17/17   Marletta Lor, MD  metFORMIN (GLUCOPHAGE) 1000 MG tablet TAKE ONE TABLET TWICE DAILY WITH MEALS 09/10/17   Marletta Lor, MD  Multiple Vitamin (MULTIVITAMIN WITH MINERALS) TABS tablet Take 1 tablet by mouth daily. 12/18/15   Isaac Bliss, Rayford Halsted, MD  oxyCODONE-acetaminophen (PERCOCET) 5-325 MG tablet Take 2  tablets by mouth every 4 (four) hours as needed. 04/30/17   Orpah Greek, MD    Family History Family History  Problem Relation Age of Onset  . Brain cancer Mother   . Diabetes Father   . Diabetes Sister   . Stroke Brother   . Seizures Brother   . Colon cancer Neg Hx   . Esophageal cancer Neg Hx   . Rectal cancer Neg Hx   . Stomach cancer Neg Hx     Social History Social History   Tobacco Use  . Smoking status: Former Smoker    Last attempt to quit: 08/24/1996    Years since quitting: 21.4  . Smokeless tobacco: Never Used  Substance Use Topics  . Alcohol use: Yes    Alcohol/week: 0.0 oz     Comment: 3-40 oz a day of beer   . Drug use: No     Allergies   Patient has no known allergies.   Review of Systems Review of Systems ROS: Statement: All systems negative except as marked or noted in the HPI; Constitutional: Negative for fever and chills. ; ; Eyes: Negative for eye pain, redness and discharge. ; ; ENMT: Negative for ear pain, hoarseness, nasal congestion, sinus pressure and sore throat. ; ; Cardiovascular: Negative for chest pain, palpitations, diaphoresis, dyspnea and peripheral edema. ; ; Respiratory: Negative for cough, wheezing and stridor. ; ; Gastrointestinal: Negative for nausea, vomiting, diarrhea, abdominal pain, blood in stool, hematemesis, jaundice and rectal bleeding. . ; ; Genitourinary: Negative for dysuria, flank pain and hematuria. ; ; Musculoskeletal: +heel pain. Negative for back pain and neck pain. Negative for swelling and trauma.; ; Skin: Negative for pruritus, rash, abrasions, blisters, bruising and skin lesion.; ; Neuro: Negative for headache, lightheadedness and neck stiffness. Negative for weakness, altered level of consciousness, altered mental status, extremity weakness, paresthesias, involuntary movement, seizure and syncope.       Physical Exam Updated Vital Signs BP (!) 144/89   Pulse 88   Temp 98 F (36.7 C) (Oral)   Resp 16   Ht 5\' 8"  (1.727 m)   Wt 83.9 kg (185 lb)   SpO2 100%   BMI 28.13 kg/m   Physical Exam 0745: Physical examination:  Nursing notes reviewed; Vital signs and O2 SAT reviewed;  Constitutional: Well developed, Well nourished, Well hydrated, In no acute distress; Head:  Normocephalic, atraumatic; Eyes: EOMI, PERRL, No scleral icterus; ENMT: Mouth and pharynx normal, Mucous membranes moist; Neck: Supple, Full range of motion, No lymphadenopathy; Cardiovascular: Regular rate and rhythm, No gallop; Respiratory: Breath sounds clear & equal bilaterally, No wheezes.  Speaking full sentences with ease, Normal respiratory  effort/excursion; Chest: Nontender, Movement normal; Abdomen: Soft, Nontender, Nondistended, Normal bowel sounds; Genitourinary: No CVA tenderness; Extremities: Pulses normal, +localized right heel tenderness to palp.  NMS intact right foot, strong pedal pp, LE muscle compartments soft.  No right proximal fibular head tenderness, no knee tenderness, no foot tenderness.  No deformity, no ecchymosis, no open wounds, no erythema.  +plantarflexion of right foot w/calf squeeze.  No palpable gap right Achilles's tendon.  No edema, No calf edema or asymmetry.; Neuro: AA&Ox3, Major CN grossly intact.  Speech clear. No gross focal motor or sensory deficits in extremities.; Skin: Color normal, Warm, Dry.   ED Treatments / Results  Labs (all labs ordered are listed, but only abnormal results are displayed)   EKG  EKG Interpretation None       Radiology   Procedures Procedures (including  critical care time)  Medications Ordered in ED Medications - No data to display   Initial Impression / Assessment and Plan / ED Course  I have reviewed the triage vital signs and the nursing notes.  Pertinent labs & imaging results that were available during my care of the patient were reviewed by me and considered in my medical decision making (see chart for details).  MDM Reviewed: previous chart, nursing note and vitals Interpretation: x-ray   Dg Os Calcis Right Result Date: 01/29/2018 CLINICAL DATA:  Acute right heel pain for 1 week. EXAM: RIGHT OS CALCIS - 2+ VIEW COMPARISON:  None. FINDINGS: There is no evidence of acute fracture, subluxation or dislocation. A small calcaneal spur is present. No suspicious bony lesions are present. IMPRESSION: Small calcaneal spur without other significant abnormality. Electronically Signed   By: Margarette Canada M.D.   On: 01/29/2018 08:08   Dg Foot Complete Right  Result Date: 01/29/2018 CLINICAL DATA:  Acute pain and redness the right foot for 1 week. EXAM: RIGHT FOOT  COMPLETE - 3+ VIEW COMPARISON:  None. FINDINGS: There is no evidence of acute fracture, subluxation or dislocation. A small calcaneal spur is present. No radiographic evidence of osteomyelitis is identified. A small calcaneal spur is present. IMPRESSION: 1. No evidence of acute abnormality 2. Small calcaneal spur. Electronically Signed   By: Margarette Canada M.D.   On: 01/29/2018 08:10    0820:  Tx symptomatically at this time. Dx and testing d/w pt.  Questions answered.  Verb understanding, agreeable to d/c home with outpt f/u.   Final Clinical Impressions(s) / ED Diagnoses   Final diagnoses:  None    ED Discharge Orders    None       Francine Graven, DO 01/31/18 [id number]

## 2018-01-29 NOTE — Discharge Instructions (Signed)
Take over the counter tylenol and ibuprofen, as directed on packaging, for the next week, as needed for discomfort.  Wear well-supportive shoes, with soft cushions or insoles for the next several months.  Apply moist heat or ice to the area(s) of discomfort, for 15 minutes at a time, several times per day for the next few days.  Do not fall asleep on a heating or ice pack.  Call your regular medical doctor and the Podiatrist on Monday to schedule a follow up appointment this week.  Return to the Emergency Department immediately if worsening.

## 2018-01-31 ENCOUNTER — Other Ambulatory Visit: Payer: Self-pay

## 2018-02-07 ENCOUNTER — Ambulatory Visit (INDEPENDENT_AMBULATORY_CARE_PROVIDER_SITE_OTHER): Payer: Medicare Other | Admitting: Internal Medicine

## 2018-02-07 ENCOUNTER — Encounter: Payer: Self-pay | Admitting: Internal Medicine

## 2018-02-07 VITALS — BP 140/82 | HR 93 | Temp 98.3°F | Ht 68.0 in | Wt 186.0 lb

## 2018-02-07 DIAGNOSIS — E7800 Pure hypercholesterolemia, unspecified: Secondary | ICD-10-CM

## 2018-02-07 DIAGNOSIS — I10 Essential (primary) hypertension: Secondary | ICD-10-CM

## 2018-02-07 DIAGNOSIS — F1020 Alcohol dependence, uncomplicated: Secondary | ICD-10-CM

## 2018-02-07 DIAGNOSIS — E119 Type 2 diabetes mellitus without complications: Secondary | ICD-10-CM

## 2018-02-07 DIAGNOSIS — E538 Deficiency of other specified B group vitamins: Secondary | ICD-10-CM | POA: Diagnosis not present

## 2018-02-07 LAB — LIPID PANEL
CHOLESTEROL: 208 mg/dL — AB (ref 0–200)
HDL: 121.1 mg/dL (ref >39.00)
NonHDL: 87.25
Total CHOL/HDL Ratio: 2
Triglycerides: 217 mg/dL — ABNORMAL HIGH (ref 0.0–149.0)
VLDL: 43.4 mg/dL — AB (ref 0.0–40.0)

## 2018-02-07 LAB — CBC WITH DIFFERENTIAL/PLATELET
BASOS ABS: 0.1 10*3/uL (ref 0.0–0.1)
BASOS PCT: 1.7 % (ref 0.0–3.0)
EOS ABS: 0 10*3/uL (ref 0.0–0.7)
Eosinophils Relative: 0.6 % (ref 0.0–5.0)
HCT: 40.9 % (ref 39.0–52.0)
Hemoglobin: 13.9 g/dL (ref 13.0–17.0)
Lymphocytes Relative: 23.9 % (ref 12.0–46.0)
Lymphs Abs: 1.6 10*3/uL (ref 0.7–4.0)
MCHC: 34 g/dL (ref 30.0–36.0)
MCV: 98.7 fl (ref 78.0–100.0)
MONO ABS: 0.6 10*3/uL (ref 0.1–1.0)
Monocytes Relative: 8.3 % (ref 3.0–12.0)
NEUTROS ABS: 4.5 10*3/uL (ref 1.4–7.7)
Neutrophils Relative %: 65.5 % (ref 43.0–77.0)
PLATELETS: 228 10*3/uL (ref 150.0–400.0)
RBC: 4.15 Mil/uL — ABNORMAL LOW (ref 4.22–5.81)
RDW: 15 % (ref 11.5–15.5)
WBC: 6.9 10*3/uL (ref 4.0–10.5)

## 2018-02-07 LAB — MICROALBUMIN / CREATININE URINE RATIO
CREATININE, U: 189 mg/dL
MICROALB UR: 6.9 mg/dL — AB (ref 0.0–1.9)
Microalb Creat Ratio: 3.7 mg/g (ref 0.0–30.0)

## 2018-02-07 LAB — COMPREHENSIVE METABOLIC PANEL
ALBUMIN: 4.3 g/dL (ref 3.5–5.2)
ALT: 35 U/L (ref 0–53)
AST: 68 U/L — AB (ref 0–37)
Alkaline Phosphatase: 71 U/L (ref 39–117)
BILIRUBIN TOTAL: 0.7 mg/dL (ref 0.2–1.2)
BUN: 16 mg/dL (ref 6–23)
CHLORIDE: 99 meq/L (ref 96–112)
CO2: 26 mEq/L (ref 19–32)
CREATININE: 0.77 mg/dL (ref 0.40–1.50)
Calcium: 9.2 mg/dL (ref 8.4–10.5)
GFR: 111.29 mL/min (ref >60.00)
Glucose, Bld: 88 mg/dL (ref 70–99)
Potassium: 4.1 mEq/L (ref 3.5–5.1)
SODIUM: 137 meq/L (ref 135–145)
Total Protein: 7.7 g/dL (ref 6.0–8.3)

## 2018-02-07 LAB — POCT GLYCOSYLATED HEMOGLOBIN (HGB A1C): HEMOGLOBIN A1C: 5.2

## 2018-02-07 LAB — TSH: TSH: 0.28 u[IU]/mL — AB (ref 0.35–4.50)

## 2018-02-07 LAB — LDL CHOLESTEROL, DIRECT: Direct LDL: 47 mg/dL

## 2018-02-07 MED ORDER — CITALOPRAM HYDROBROMIDE 10 MG PO TABS: ORAL_TABLET | ORAL | 3 refills | Status: DC

## 2018-02-07 MED ORDER — CYANOCOBALAMIN 1000 MCG/ML IJ SOLN
1000.0000 ug | Freq: Once | INTRAMUSCULAR | Status: AC
  Administered 2018-02-07: 1000 ug via INTRAMUSCULAR

## 2018-02-07 NOTE — Patient Instructions (Signed)
Discontinue glipizide  Discontinue all alcoholic beverages  Return in 3 months for follow-up  Limit your sodium (Salt) intake  Please check your blood pressure on a regular basis.  If it is consistently greater than 150/90, please make an office appointment.

## 2018-02-07 NOTE — Addendum Note (Signed)
Addended by: Gwenyth Ober R on: 02/07/2018 04:46 PM   Modules accepted: Orders

## 2018-02-07 NOTE — Progress Notes (Signed)
Subjective:    Patient ID: Anthony [name], male    DOB: 09/11/1961, 57 y.o.   MRN: [id number]  HPI  57 year old patient who is seen today for follow-up.  He has a history of type 2 diabetes which has been controlled on oral medications.  Hemoglobin A1c's have been in a normal range. He has a history of alcoholism dyslipidemia and also B12 deficiency. No concerns or complaints today. He continues to drink alcoholic beverages.   Past Medical History:  Diagnosis Date  . ADD (attention deficit disorder with hyperactivity)   . Anxiety   . Diabetes mellitus   . GERD (gastroesophageal reflux disease)   . Hx of colonic polyp - sessile serrated polyp 12/07/2014  . Hyperlipidemia   . Hypertension      Social History   Socioeconomic History  . Marital status: Single    Spouse name: Not on file  . Number of children: Not on file  . Years of education: Not on file  . Highest education level: Not on file  Social Needs  . Financial resource strain: Not on file  . Food insecurity - worry: Not on file  . Food insecurity - inability: Not on file  . Transportation needs - medical: Not on file  . Transportation needs - non-medical: Not on file  Occupational History  . Not on file  Tobacco Use  . Smoking status: Former Smoker    Last attempt to quit: 08/24/1996    Years since quitting: 21.4  . Smokeless tobacco: Never Used  Substance and Sexual Activity  . Alcohol use: Yes    Alcohol/week: 0.0 oz    Comment: 3-40 oz a day of beer   . Drug use: No  . Sexual activity: Not on file  Other Topics Concern  . Not on file  Social History Narrative  . Not on file    Past Surgical History:  Procedure Laterality Date  . PILONIDAL CYST EXCISION      Family History  Problem Relation Age of Onset  . Brain cancer Mother   . Diabetes Father   . Diabetes Sister   . Stroke Brother   . Seizures Brother   . Colon cancer Neg Hx   . Esophageal cancer Neg Hx   . Rectal cancer Neg Hx   .  Stomach cancer Neg Hx     No Known Allergies  Current Outpatient Medications on File Prior to Visit  Medication Sig Dispense Refill  . ACCU-CHEK AVIVA PLUS test strip USE TO TEST BLOOD SUGAR DAILY. (Patient not taking: Reported on 02/07/2018) 100 each 4  . ACCU-CHEK SOFTCLIX LANCETS lancets USE TO TEST BLOOD SUGAR DAILY. (Patient not taking: Reported on 02/07/2018) 100 each 4  . acetaminophen (TYLENOL) 325 MG tablet Take 650 mg by mouth every 6 (six) hours as needed for moderate pain.    Marland Kitchen aspirin 81 MG tablet Take 81 mg by mouth daily.      Marland Kitchen atorvastatin (LIPITOR) 40 MG tablet TAKE ONE (1) TABLET EACH DAY (Patient not taking: Reported on 02/07/2018) 90 tablet 3  . canagliflozin (INVOKANA) 300 MG TABS tablet Take 1 tablet (300 mg total) by mouth daily before breakfast. (Patient not taking: Reported on 02/07/2018) 30 tablet 5  . citalopram (CELEXA) 10 MG tablet TAKE ONE (1) TABLET EACH DAY (Patient not taking: Reported on 02/07/2018) 90 tablet 3  . fluticasone (FLONASE) 50 MCG/ACT nasal spray USE 2 SPRAYS IN BOTH NOSTRILS ONCE DAILY. SHAKE WELL BEFORE EACH USE. (Patient  not taking: Reported on 02/07/2018) 48 g 3  . gabapentin (NEURONTIN) 300 MG capsule TAKE ONE CAPSULE BY MOUTH TWICE A DAY (Patient not taking: Reported on 02/07/2018) 180 capsule 3  . GLIPIZIDE XL 5 MG 24 hr tablet TAKE ONE (1) TABLET EACH DAY (Patient not taking: Reported on 02/07/2018) 90 tablet 3  . ibuprofen (ADVIL,MOTRIN) 200 MG tablet Take 600 mg by mouth every 6 (six) hours as needed for mild pain.    Marland Kitchen labetalol (NORMODYNE) 300 MG tablet Take 0.5 tablets (150 mg total) by mouth 2 (two) times daily. (Patient not taking: Reported on 02/07/2018) 180 tablet 5  . metFORMIN (GLUCOPHAGE) 1000 MG tablet TAKE ONE TABLET TWICE DAILY WITH MEALS (Patient not taking: Reported on 02/07/2018) 180 tablet 3  . Multiple Vitamin (MULTIVITAMIN WITH MINERALS) TABS tablet Take 1 tablet by mouth daily. (Patient not taking: Reported on 02/07/2018)    .  oxyCODONE-acetaminophen (PERCOCET) 5-325 MG tablet Take 2 tablets by mouth every 4 (four) hours as needed. (Patient not taking: Reported on 02/07/2018) 6 tablet 0   No current facility-administered medications on file prior to visit.     BP 140/82 (BP Location: Right Arm, Patient Position: Sitting, Cuff Size: Normal)   Pulse 93   Temp 98.3 F (36.8 C) (Oral)   Ht 5\' 8"  (1.727 m)   Wt 186 lb (84.4 kg)   SpO2 96%   BMI 28.28 kg/m     Review of Systems  Constitutional: Negative for appetite change, chills, fatigue and fever.  HENT: Negative for congestion, dental problem, ear pain, hearing loss, sore throat, tinnitus, trouble swallowing and voice change.   Eyes: Negative for pain, discharge and visual disturbance.  Respiratory: Negative for cough, chest tightness, wheezing and stridor.   Cardiovascular: Negative for chest pain, palpitations and leg swelling.  Gastrointestinal: Negative for abdominal distention, abdominal pain, blood in stool, constipation, diarrhea, nausea and vomiting.  Genitourinary: Negative for difficulty urinating, discharge, flank pain, genital sores, hematuria and urgency.  Musculoskeletal: Negative for arthralgias, back pain, gait problem, joint swelling, myalgias and neck stiffness.  Skin: Negative for rash.  Neurological: Negative for dizziness, syncope, speech difficulty, weakness, numbness and headaches.  Hematological: Negative for adenopathy. Does not bruise/bleed easily.  Psychiatric/Behavioral: Negative for behavioral problems and dysphoric mood. The patient is not nervous/anxious.        Objective:   Physical Exam  Constitutional: He is oriented to person, place, and time. He appears well-developed.  HENT:  Head: Normocephalic.  Right Ear: External ear normal.  Left Ear: External ear normal.  Eyes: Conjunctivae and EOM are normal.  Neck: Normal range of motion.  Cardiovascular: Normal rate and normal heart sounds.  Pulmonary/Chest: Breath  sounds normal.  Abdominal: Bowel sounds are normal.  Musculoskeletal: Normal range of motion. He exhibits no edema or tenderness.  Neurological: He is alert and oriented to person, place, and time.  Psychiatric: He has a normal mood and affect. His behavior is normal.          Assessment & Plan:   Diabetes mellitus.  Under tight control.  Will discontinue glipizide Alcoholism.  Total abstinence encouraged B12 deficiency. We will give monthly B12 injection.  Dyslipidemia we will check lipid profile  Follow-up 3 months  Nyoka Cowden

## 2018-02-08 LAB — HEPATITIS C ANTIBODY
Hepatitis C Ab: NONREACTIVE
SIGNAL TO CUT-OFF: 0.03 (ref <1.00)

## 2018-02-12 DIAGNOSIS — R0789 Other chest pain: Secondary | ICD-10-CM | POA: Diagnosis not present

## 2018-02-15 ENCOUNTER — Ambulatory Visit: Payer: Self-pay | Admitting: *Deleted

## 2018-02-15 DIAGNOSIS — R079 Chest pain, unspecified: Secondary | ICD-10-CM | POA: Diagnosis not present

## 2018-02-15 DIAGNOSIS — S6992XA Unspecified injury of left wrist, hand and finger(s), initial encounter: Secondary | ICD-10-CM | POA: Diagnosis not present

## 2018-02-15 DIAGNOSIS — S80212A Abrasion, left knee, initial encounter: Secondary | ICD-10-CM | POA: Diagnosis not present

## 2018-02-15 DIAGNOSIS — R0789 Other chest pain: Secondary | ICD-10-CM | POA: Diagnosis not present

## 2018-02-15 DIAGNOSIS — Z87891 Personal history of nicotine dependence: Secondary | ICD-10-CM | POA: Diagnosis not present

## 2018-02-15 DIAGNOSIS — S80211A Abrasion, right knee, initial encounter: Secondary | ICD-10-CM | POA: Diagnosis not present

## 2018-02-15 DIAGNOSIS — S20311A Abrasion of right front wall of thorax, initial encounter: Secondary | ICD-10-CM | POA: Diagnosis not present

## 2018-02-15 DIAGNOSIS — S60512A Abrasion of left hand, initial encounter: Secondary | ICD-10-CM | POA: Diagnosis not present

## 2018-02-15 DIAGNOSIS — T1490XA Injury, unspecified, initial encounter: Secondary | ICD-10-CM | POA: Diagnosis not present

## 2018-02-15 DIAGNOSIS — S0031XA Abrasion of nose, initial encounter: Secondary | ICD-10-CM | POA: Diagnosis not present
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
## 2018-02-15 NOTE — Telephone Encounter (Signed)
Dr. K - FYI. Thanks! 

## 2018-02-21 ENCOUNTER — Emergency Department (HOSPITAL_COMMUNITY)
Admission: EM | Admit: 2018-02-21 | Discharge: 2018-02-22 | Disposition: A | Discharge status: Home / Self Care | Payer: Medicare Other | Attending: Emergency Medicine | Admitting: Emergency Medicine

## 2018-02-21 ENCOUNTER — Emergency Department (HOSPITAL_COMMUNITY): Payer: Medicare Other

## 2018-02-21 ENCOUNTER — Encounter (HOSPITAL_COMMUNITY): Payer: Self-pay | Admitting: *Deleted

## 2018-02-21 ENCOUNTER — Other Ambulatory Visit: Payer: Self-pay

## 2018-02-21 DIAGNOSIS — Y999 Unspecified external cause status: Secondary | ICD-10-CM | POA: Insufficient documentation

## 2018-02-21 DIAGNOSIS — E119 Type 2 diabetes mellitus without complications: Secondary | ICD-10-CM | POA: Diagnosis not present

## 2018-02-21 DIAGNOSIS — F909 Attention-deficit hyperactivity disorder, unspecified type: Secondary | ICD-10-CM | POA: Diagnosis not present

## 2018-02-21 DIAGNOSIS — S2190XA Unspecified open wound of unspecified part of thorax, initial encounter: Secondary | ICD-10-CM | POA: Diagnosis not present

## 2018-02-21 DIAGNOSIS — Y939 Activity, unspecified: Secondary | ICD-10-CM | POA: Insufficient documentation

## 2018-02-21 DIAGNOSIS — Z87891 Personal history of nicotine dependence: Secondary | ICD-10-CM | POA: Diagnosis not present

## 2018-02-21 DIAGNOSIS — S299XXA Unspecified injury of thorax, initial encounter: Secondary | ICD-10-CM | POA: Diagnosis not present

## 2018-02-21 DIAGNOSIS — S298XXA Other specified injuries of thorax, initial encounter: Secondary | ICD-10-CM | POA: Insufficient documentation

## 2018-02-21 DIAGNOSIS — I10 Essential (primary) hypertension: Secondary | ICD-10-CM | POA: Insufficient documentation

## 2018-02-21 DIAGNOSIS — Y929 Unspecified place or not applicable: Secondary | ICD-10-CM | POA: Diagnosis not present
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
## 2018-02-22 NOTE — ED Notes (Signed)
Pt alert & oriented x4, stable gait. Patient given discharge instructions, paperwork & prescription(s). Patient  instructed to stop at the registration desk to finish any additional paperwork. Patient verbalized understanding. Pt left department w/ no further questions. 
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
## 2018-02-23 ENCOUNTER — Telehealth: Payer: Self-pay | Admitting: Internal Medicine

## 2018-02-23 ENCOUNTER — Encounter: Payer: Self-pay | Admitting: *Deleted
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
## 2018-02-23 NOTE — Telephone Encounter (Signed)
Called Mrs. [place]. Left VM that  someone from the office will phone her with an appointment with Dr. Raliegh Ip.

## 2018-02-23 NOTE — Telephone Encounter (Signed)
This encounter was created in error - please disregard.

## 2018-02-23 NOTE — Telephone Encounter (Signed)
Patient's mother-in-law, Anthony [name] phoned to request an appointment to have Dr. Raliegh Ip speak with her and Anthony [name] regarding his alcohol drinking and the dangerous behaviors that come along with it. She stated he is willing to commit to inpatient therapy if he can get into a program like [facility]. Mrs. Oakes stated she is very concerned for his health and fears without this type of intervention, he will continue to go downhill. Stated she has taken him to several short-term inpatient treatments as well as outpatient treatments with no success. They would like an appointment as soon as possible with Dr. Raliegh Ip. I have offered an appointment with other providers for this week but she would like to see Dr. Raliegh Ip.

## 2018-02-25 NOTE — Telephone Encounter (Signed)
Called Betty patients mother-law and scheduled an appointment for office visit. Inez Catalina will notify patient of appointment No further action needed.

## 2018-03-07 ENCOUNTER — Ambulatory Visit (INDEPENDENT_AMBULATORY_CARE_PROVIDER_SITE_OTHER): Payer: Medicare Other | Admitting: Family Medicine

## 2018-03-07 DIAGNOSIS — E538 Deficiency of other specified B group vitamins: Secondary | ICD-10-CM

## 2018-03-07 MED ORDER — CYANOCOBALAMIN 1000 MCG/ML IJ SOLN
1000.0000 ug | Freq: Once | INTRAMUSCULAR | Status: AC
  Administered 2018-03-07: 1000 ug via INTRAMUSCULAR

## 2018-03-07 NOTE — Progress Notes (Signed)
Per orders of Dr. Mikle Bosworth, injection of Vitamin B 12 given by Aggie Hacker ANN. Patient tolerated injection well.

## 2018-03-09 ENCOUNTER — Ambulatory Visit (HOSPITAL_COMMUNITY)
Admission: RE | Admit: 2018-03-09 | Discharge: 2018-03-09 | Disposition: A | Discharge status: Home / Self Care | Payer: Medicare Other | Attending: Psychiatry | Admitting: Psychiatry

## 2018-03-09 ENCOUNTER — Encounter: Payer: Self-pay | Admitting: Internal Medicine

## 2018-03-09 ENCOUNTER — Ambulatory Visit (INDEPENDENT_AMBULATORY_CARE_PROVIDER_SITE_OTHER): Payer: Medicare Other | Admitting: Internal Medicine

## 2018-03-09 DIAGNOSIS — F1020 Alcohol dependence, uncomplicated: Secondary | ICD-10-CM

## 2018-03-09 DIAGNOSIS — E119 Type 2 diabetes mellitus without complications: Secondary | ICD-10-CM

## 2018-03-09 NOTE — Patient Instructions (Addendum)
Report to behavioral health hospital immediately for evaluation for alcohol rehab   Alcohol Withdrawal When a person who drinks a lot of alcohol stops drinking, he or she may go through alcohol withdrawal. Alcohol withdrawal causes problems. It can make you feel:  Tired (fatigued).  Sad (depressed).  Fearful (anxious).  Grouchy (irritable).  Not hungry.  Sick to your stomach (nauseous).  Shaky.  It can also make you have:  Nightmares.  Trouble sleeping.  Trouble thinking clearly.  Mood swings.  Clammy skin.  Very bad sweating.  A very fast heartbeat.  Shaking that you cannot control (tremor).  Having a fever.  A fit of movements that you cannot control (seizure).  Confusion.  Throwing up (vomiting).  Feeling or seeing things that are not there (hallucinations).  Follow these instructions at home:  Take medicines and vitamins only as told by your doctor.  Do not drink alcohol.  Have someone around in case you need help.  Drink enough fluid to keep your pee (urine) clear or pale yellow.  Think about joining a group to help you stop drinking. Contact a doctor if:  Your problems get worse.  Your problems do not go away.  You cannot eat or drink without throwing up.  You are having a hard time not drinking alcohol.  You cannot stop drinking alcohol. Get help right away if:  You feel your heart beating differently than usual.  Your chest hurts.  You have trouble breathing.  You have very bad problems, like: ? A fever. ? A fit of movements that you cannot control. ? Being very confused. ? Feeling or seeing things that are not there. This information is not intended to replace advice given to you by your health care provider. Make sure you discuss any questions you have with your health care provider. Document Released: 05/25/2008 Document Revised: 05/14/2016 Document Reviewed: 09/25/2014 Elsevier Interactive Patient Education  2018  [employer].  What You Need To Know About Alcohol Abuse and Dependence, Adult Alcohol is a widely available drug. People who use alcohol will consume it in varying amounts. People who drink alcohol in excess, and have behavior problems during and after drinking alcohol, may have what is called an alcohol use disorder. Alcohol abuse and alcohol dependence are the two main types of alcohol use disorders:  Alcohol abuse is when you use alcohol too much or too often. You may use alcohol to make yourself feel happy or to reduce stress, but you may have a hard time setting a limit on the amount you drink.  Alcohol dependence is when you use alcohol excessively for a period of time, and your body and brain chemistry changes as a result. This can make it hard to stop drinking because you may start to feel sick or feel different when you do not use alcohol.  How can alcohol abuse and dependence affect me? Alcohol abuse and dependence can have a negative effect on your life. Excessive use of alcohol may lead to an addiction. You may feel like you need alcohol to function normally. You may drink alcohol before work in the morning, during the day, or as soon as you get home from work in the evening. These actions can result in:  Poor performance at work.  Losing your job.  Financial problems.  Car crashes or criminal charges from driving after drinking alcohol.  Problems in your relationships with friends and family.  Losing the trust and respect of co-workers, friends, and family.  Drinking heavily over a long period of time can permanently damage your body and brain, and can cause lifelong health issues, such as:  Liver disease.  Heart problems, high blood pressure, or stroke.  Damage to your pancreas.  Certain cancers.  Decreased ability to fight infections.  Numbness or tingling in hands or feet (neuropathy).  Brain damage.  Depression.  Early (premature) death.  When your body  craves alcohol, it is easy to drink more than your body can handle. As a result, you may overdose. Alcohol overdose is a serious situation that requires hospitalization. It may lead to permanent injuries or death. What are the benefits of avoiding alcohol use? Limiting or avoiding alcohol can help you:  Avoid risks to your body, brain, and relationships.  Avoid the risk of abusing or becoming dependent on alcohol.  Keep your mind and body healthy. As a result, you may be more likely to accomplish your life goals.  Avoid permanent injury, organ damage, or death due to alcohol use.  What steps can I take to stop drinking?  The best way to avoid alcohol abuse, dependence, and addiction is not to drink at all, or to drink measured amounts. Measured drinking means no more than 1 drink a day for nonpregnant women and 2 drinks a day for men. One drink equals 12 oz of beer, 5 oz of wine, or 1 oz of hard liquor.  Stop drinking if you have been drinking too much. This can be very hard to do if you are used to abusing alcohol. If you find it hard to stop drinking, talk about your experience with someone you trust. This person may be able to help you change your drinking behavior.  Instead of drinking alcohol, do something else, like a hobby or exercise.  Find healthy ways to cope with stress, such as exercise, meditation, or spending time with people you care about.  In social gatherings and places where there may be alcohol, make intentional choices to drink non-alcohol beverages.  If your family, co-workers, or friends drink, talk to them about supporting you in your efforts to stop drinking. Ask them not to drink around you. Spend more time with people who do not drink alcohol.  If you think that you have an alcohol dependency problem: ? Tell friends or family about your concerns. ? Talk with your health care provider or another health professional about where to get help. ? Work with a  Transport planner and a Regulatory affairs officer. ? Consider joining a support group for people who struggle with alcohol abuse, dependence, and addiction. Where to find support: You can get support for preventing alcohol abuse, dependence, and addiction from:  Your health care provider.  Alcoholics Anonymous (AA): NicTax.com.pt  SMART Recovery: www.smartrecovery.org  Local treatment centers or chemical dependency counselors.  Where to find more information: Learn more about alcohol abuse and dependence from:  Centers for Disease Control and Prevention: GoalForum.com.au  [employer] on Alcohol Abuse and Alcoholism: https://clark.org/  Local AA groups in your community.  Contact a health care provider if:  You drink more or for longer than you intended, on more than one occasion.  You tried to stop drinking or to cut back on how much you drink, but you were not able to.  You often drink to the point of vomiting or passing out.  You want to drink so badly that you cannot think about anything else.  Drinking has created problems in your life, but you continue  to drink.  You keep drinking even though you feel anxious, depressed, or have experienced memory loss.  You have stopped doing the things you used to enjoy in order to drink.  You have to drink more than you used to in order to get the effect you want.  You experience anxiety, sweating, nausea, shakiness, and trouble sleeping when you try to stop drinking.  You have thoughts about hurting yourself or others. If you ever feel like you may hurt yourself or others, or have thoughts about taking your own life, get help right away. You can go to your nearest emergency department or call:  Your local emergency services (911 in the [place]  A suicide crisis helpline, such as the [place] at 437-607-4675. This is open 24  hours a day.  Summary  Alcohol is a widely available drug. Misusing, abusing, and becoming dependent on alcohol can cause many problems.  It is important to measure and limit the amount of alcohol you consume. It is recommended to limit alcohol use to 1 drink a day for nonpregnant women and 2 drinks a day for men.  The risks associated with drinking too much will have a direct negative impact on your work, relationships, and health.  If you realize that you are having some challenges keeping your drinking under control, find some ways to change your behavior. Hobbies, self calming activities, exercise, or support groups can help.  If you feel you need help with changing your drinking habits, talk with your health care provider, a good friend, or a therapist, or go to an [facility] group. This information is not intended to replace advice given to you by your health care provider. Make sure you discuss any questions you have with your health care provider. Document Released: 12/01/2016 Document Revised: 12/01/2016 Document Reviewed: 12/01/2016 Elsevier Interactive Patient Education  [employer].

## 2018-03-09 NOTE — BH Assessment (Signed)
Assessment Note  Anthony [name] is an 57 y.o. male. Pt denies SI/HI and AVH. Pt reports severe alcohol abuse. Pt states he drinks 15 beers a day. Pt reports withdrawal symptoms. Pt states he has sweating, nausea, and shaking. Pt states he is seeking detox and SA inpatient treatment.  Sh[place], NP recommends D/C and SA resources.  Diagnosis:  F10.20 Alcohol, severe  Past Medical History:  Past Medical History:  Diagnosis Date  . ADD (attention deficit disorder with hyperactivity)   . Anxiety   . Diabetes mellitus   . GERD (gastroesophageal reflux disease)   . Hx of colonic polyp - sessile serrated polyp 12/07/2014  . Hyperlipidemia   . Hypertension     Past Surgical History:  Procedure Laterality Date  . PILONIDAL CYST EXCISION      Family History:  Family History  Problem Relation Age of Onset  . Brain cancer Mother   . Diabetes Father   . Diabetes Sister   . Stroke Brother   . Seizures Brother   . Colon cancer Neg Hx   . Esophageal cancer Neg Hx   . Rectal cancer Neg Hx   . Stomach cancer Neg Hx     Social History:  reports that he quit smoking about 21 years ago. he has never used smokeless tobacco. He reports that he drinks alcohol. He reports that he does not use drugs.  Additional Social History:  Alcohol / Drug Use Pain Medications: please see mar Prescriptions: please see mar Over the Counter: please see mar History of alcohol / drug use?: Yes Longest period of sobriety (when/how long): unknown Negative Consequences of Use: Financial, Personal relationships, Work / Youth worker Withdrawal Symptoms: Agitation, Aggressive/Assaultive, Fever / Chills, Irritability, Nausea / Vomiting Substance #1 Name of Substance 1: alcohol 1 - Age of First Use: unknown 1 - Amount (size/oz): 15 beers 1 - Frequency: daily 1 - Duration: ongoing 1 - Last Use / Amount: 03/09/18  CIWA: CIWA-Ar BP: (!) 162/100 Pulse Rate: 86 COWS:    Allergies: No Known Allergies  Home  Medications:  (Not in a hospital admission)  OB/GYN Status:  No LMP for male patient.  General Assessment Data Location of Assessment: [facility] Assessment Services TTS Assessment: In system Is this a Tele or Face-to-Face Assessment?: Face-to-Face Is this an Initial Assessment or a Re-assessment for this encounter?: Initial Assessment Marital status: Widowed [place] name: NA Is patient pregnant?: No Pregnancy Status: No Living Arrangements: Alone Can pt return to current living arrangement?: Yes Admission Status: Voluntary Is patient capable of signing voluntary admission?: Yes Referral Source: Self/Family/Friend Insurance type: United/Medicare  Medical Screening Exam ([place]) Medical Exam completed: Yes  Crisis Care Plan Living Arrangements: Alone Legal Guardian: Other:(self) Name of Psychiatrist: NA Name of Therapist: NA  Education Status Is patient currently in school?: No Is the patient employed, unemployed or receiving disability?: Unemployed  Risk to self with the past 6 months Suicidal Ideation: No Has patient been a risk to self within the past 6 months prior to admission? : No Suicidal Intent: No Has patient had any suicidal intent within the past 6 months prior to admission? : No Is patient at risk for suicide?: No Suicidal Plan?: No Has patient had any suicidal plan within the past 6 months prior to admission? : No Access to Means: No What has been your use of drugs/alcohol within the last 12 months?: NA Previous Attempts/Gestures: No How many times?: 0 Other Self Harm Risks: NA Triggers for Past Attempts:  None known Intentional Self Injurious Behavior: None Family Suicide History: No Recent stressful life event(s): Other (Comment)(SA) Persecutory voices/beliefs?: No Depression: No Depression Symptoms: (pt denies) Substance abuse history and/or treatment for substance abuse?: No Suicide prevention information given to non-admitted patients: Not  applicable  Risk to Others within the past 6 months Homicidal Ideation: No Does patient have any lifetime risk of violence toward others beyond the six months prior to admission? : No Thoughts of Harm to Others: No Current Homicidal Intent: No Current Homicidal Plan: No Access to Homicidal Means: No Identified Victim: NA History of harm to others?: No Assessment of Violence: None Noted Violent Behavior Description: NA Does patient have access to weapons?: No Criminal Charges Pending?: No Does patient have a court date: No Is patient on probation?: No  Psychosis Hallucinations: None noted Delusions: None noted  Mental Status Report Appearance/Hygiene: Unremarkable Eye Contact: Fair Motor Activity: Freedom of movement Speech: Logical/coherent Level of Consciousness: Alert Mood: Anxious Affect: Anxious Anxiety Level: Minimal Thought Processes: Coherent, Relevant Judgement: Unimpaired Orientation: Person, Place, Time, Situation Obsessive Compulsive Thoughts/Behaviors: None  Cognitive Functioning Concentration: Normal Memory: Recent Intact, Remote Intact Is patient IDD: No Is patient DD?: No Insight: Fair Impulse Control: Fair Appetite: Fair Have you had any weight changes? : No Change Sleep: No Change Total Hours of Sleep: 8 Vegetative Symptoms: None  ADLScreening [facility] Assessment Services) Patient's cognitive ability adequate to safely complete daily activities?: Yes Patient able to express need for assistance with ADLs?: Yes Independently performs ADLs?: Yes (appropriate for developmental age)  Prior Inpatient Therapy Prior Inpatient Therapy: Yes Prior Therapy Dates: multiple Prior Therapy Facilty/Provider(s): SA facilities Reason for Treatment: SA  Prior Outpatient Therapy Prior Outpatient Therapy: No Does patient have an ACCT team?: No Does patient have Intensive In-House Services?  : No Does patient have [facility] services? : No Does patient have P4CC  services?: No  ADL Screening (condition at time of admission) Patient's cognitive ability adequate to safely complete daily activities?: Yes Is the patient deaf or have difficulty hearing?: No Does the patient have difficulty seeing, even when wearing glasses/contacts?: No Does the patient have difficulty concentrating, remembering, or making decisions?: No Patient able to express need for assistance with ADLs?: Yes Does the patient have difficulty dressing or bathing?: No Independently performs ADLs?: Yes (appropriate for developmental age)       Abuse/Neglect Assessment (Assessment to be complete while patient is alone) Abuse/Neglect Assessment Can Be Completed: Yes Physical Abuse: Denies Verbal Abuse: Denies Sexual Abuse: Denies Exploitation of patient/patient's resources: Denies     Regulatory affairs officer (For Healthcare) Does Patient Have a Medical Advance Directive?: No Would patient like information on creating a medical advance directive?: No - Patient declined    Additional Information 1:1 In Past 12 Months?: No CIRT Risk: No Elopement Risk: No Does patient have medical clearance?: Yes     Disposition:  Disposition Initial Assessment Completed for this Encounter: Yes Disposition of Patient: Discharge Patient refused recommended treatment: No Mode of transportation if patient is discharged?: Car Patient referred to: Other (Comment)(SA facility)  On Site Evaluation by:   Reviewed with Physician:    Cyndia Bent 03/09/2018 2:25 PM

## 2018-03-09 NOTE — Progress Notes (Signed)
Subjective:    Patient ID: Anthony [name], male    DOB: 09-Aug-1961, 57 y.o.   MRN: [id number]  HPI  Lab Results  Component Value Date   HGBA1C 5.2 02/07/2018   57 year old patient who has a history of chronic alcoholism.  He has been treated a number of facilities over the years and [place] and also at [facility]. He is accompanied by his former mother-in-law.  The patient drinks daily to intoxication.  He has experienced some mild withdrawal symptoms in the past.  He states that he wishes to pursue alcoholic rehab at this time and is agreeable for inpatient management.    Past Medical History:  Diagnosis Date  . ADD (attention deficit disorder with hyperactivity)   . Anxiety   . Diabetes mellitus   . GERD (gastroesophageal reflux disease)   . Hx of colonic polyp - sessile serrated polyp 12/07/2014  . Hyperlipidemia   . Hypertension      Social History   Socioeconomic History  . Marital status: Single    Spouse name: Not on file  . Number of children: Not on file  . Years of education: Not on file  . Highest education level: Not on file  Social Needs  . Financial resource strain: Not on file  . Food insecurity - worry: Not on file  . Food insecurity - inability: Not on file  . Transportation needs - medical: Not on file  . Transportation needs - non-medical: Not on file  Occupational History  . Not on file  Tobacco Use  . Smoking status: Former Smoker    Last attempt to quit: 08/24/1996    Years since quitting: 21.5  . Smokeless tobacco: Never Used  Substance and Sexual Activity  . Alcohol use: Yes    Alcohol/week: 0.0 oz    Comment: 3-40 oz a day of beer   . Drug use: No  . Sexual activity: Not on file  Other Topics Concern  . Not on file  Social History Narrative  . Not on file    Past Surgical History:  Procedure Laterality Date  . PILONIDAL CYST EXCISION      Family History  Problem Relation Age of Onset  . Brain cancer Mother   .  Diabetes Father   . Diabetes Sister   . Stroke Brother   . Seizures Brother   . Colon cancer Neg Hx   . Esophageal cancer Neg Hx   . Rectal cancer Neg Hx   . Stomach cancer Neg Hx     No Known Allergies  Current Outpatient Medications on File Prior to Visit  Medication Sig Dispense Refill  . ACCU-CHEK AVIVA PLUS test strip USE TO TEST BLOOD SUGAR DAILY. 100 each 4  . ACCU-CHEK SOFTCLIX LANCETS lancets USE TO TEST BLOOD SUGAR DAILY. 100 each 4  . acetaminophen (TYLENOL) 325 MG tablet Take 650 mg by mouth every 6 (six) hours as needed for moderate pain.    Marland Kitchen atorvastatin (LIPITOR) 40 MG tablet TAKE ONE (1) TABLET EACH DAY 90 tablet 3  . citalopram (CELEXA) 10 MG tablet TAKE ONE (1) TABLET EACH DAY 90 tablet 3  . fluticasone (FLONASE) 50 MCG/ACT nasal spray USE 2 SPRAYS IN BOTH NOSTRILS ONCE DAILY. SHAKE WELL BEFORE EACH USE. 48 g 3  . gabapentin (NEURONTIN) 300 MG capsule TAKE ONE CAPSULE BY MOUTH TWICE A DAY 180 capsule 3  . ibuprofen (ADVIL,MOTRIN) 200 MG tablet Take 600 mg by mouth every 6 (six)  hours as needed for mild pain.    Marland Kitchen labetalol (NORMODYNE) 300 MG tablet Take 0.5 tablets (150 mg total) by mouth 2 (two) times daily. 180 tablet 5  . metFORMIN (GLUCOPHAGE) 1000 MG tablet TAKE ONE TABLET TWICE DAILY WITH MEALS 180 tablet 3  . Multiple Vitamin (MULTIVITAMIN WITH MINERALS) TABS tablet Take 1 tablet by mouth daily.     No current facility-administered medications on file prior to visit.     There were no vitals taken for this visit.    Review of Systems  Constitutional: Positive for fatigue.  Neurological: Positive for weakness.  Psychiatric/Behavioral: Negative.        Objective:   Physical Exam  Constitutional: He appears well-developed and well-nourished. No distress.  Psychiatric: He has a normal mood and affect. His behavior is normal. Judgment and thought content normal.  Alert and appropriate No hyperactivity          Assessment & Plan:   Chronic  alcoholism.  Patient is agreeable for inpatient treatment.  Patient referred to behavioral health Hospital for evaluation.  Unclear whether he will need admission to acute care hospital for detoxification Patient discharged to the care of his mother-in-law who will transport patient to the behavioral [facility]

## 2018-03-09 NOTE — H&P (Signed)
Behavioral Health Medical Screening Exam  Anthony Anthony [name] is an 57 y.o. male patient presents to call behavioral health Hospital as a walk-in brought in by his mother-in-law.  Patient seeking rehab services and detox services for alcohol.  Patient denies suicidal/self-harm/homicidal ideation, paranoia, and psychosis.  Patient also denies a history of seizures with alcohol withdrawal, and a history of DT's with alcohol withdrawal.  Total Time spent with patient: 30 minutes  Psychiatric Specialty Exam: Physical Exam  Constitutional: He is oriented to person, place, and time. He appears well-developed.  HENT:  Head: Normocephalic.  Neck: Normal range of motion. Neck supple.  Respiratory: Effort normal.  Musculoskeletal: Normal range of motion.  Neurological: He is alert and oriented to person, place, and time.  Psychiatric: He has a normal mood and affect. His speech is normal and behavior is normal. Thought content normal. Cognition and memory are normal.    Review of Systems  Neurological: Positive for tremors (With alcohol withdrawl). Seizures: Denies.  Psychiatric/Behavioral: Depression: Denies. Hallucinations: Denies. Substance abuse: ETOH. Suicidal ideas: Denies. Nervous/anxious: Denies. Insomnia: Denies.   All other systems reviewed and are negative.   Blood pressure (!) 162/100, pulse 86, temperature 98.8 F (37.1 C), resp. rate 18, SpO2 99 %.There is Anthony [name] height or weight on file to calculate BMI.  General Appearance: Casual  Eye Contact:  Good  Speech:  Clear and Coherent and Normal Rate  Volume:  Normal  Mood:  Anxious  Affect:  Appropriate and Congruent  Thought Process:  Coherent and Goal Directed  Orientation:  Full (Time, Place, and Person)  Thought Content:  Denies hallucinations, delusions, and paranoia  Suicidal Thoughts:  Anthony [name]  Homicidal Thoughts:  Anthony [name]  Memory:  Immediate;   Good Recent;   Good Remote;   Good  Judgement:  Fair  Insight:  Fair  Psychomotor Activity:  slight  Tremor   Concentration: Concentration: Good and Attention Span: Good  Recall:  Good  Fund of Knowledge:Good  Language: Good  Akathisia:  Anthony [name]  Handed:  Right  AIMS (if indicated):     Assets:  Communication Skills Desire for Improvement Housing Social Support  Sleep:       Musculoskeletal: Strength & Muscle Tone: within normal limits Gait & Station: normal Patient leans: N/A  Blood pressure (!) 162/100, pulse 86, temperature 98.8 F (37.1 C), resp. rate 18, SpO2 99 %.  Based on my evaluation the patient does not appear to have an emergency medical condition.   Recommendations: Give resources for outpatient alcohol drug services, other community resources for short and long-term rehab services.  Disposition: Anthony [name] evidence of imminent risk to self or others at present.   Patient does not meet criteria for psychiatric inpatient admission.  Refer to IOP.   Alessio Bogan, NP 03/09/2018, 1:39 PM

## 2018-03-14 MED ORDER — HYDROXYZINE HCL 25 MG PO TABS
50.00 | ORAL_TABLET | ORAL | Status: DC
Start: ? — End: 2018-03-14

## 2018-03-14 MED ORDER — ATORVASTATIN CALCIUM 40 MG PO TABS
40.00 | ORAL_TABLET | ORAL | Status: DC
Start: 2018-03-14 — End: 2018-03-14

## 2018-03-14 MED ORDER — TOPIRAMATE 25 MG PO TABS
50.00 | ORAL_TABLET | ORAL | Status: DC
Start: 2018-03-14 — End: 2018-03-14

## 2018-03-14 MED ORDER — INSULIN LISPRO 100 UNIT/ML ~~LOC~~ SOLN
2.00 | SUBCUTANEOUS | Status: DC
Start: 2018-03-15 — End: 2018-03-14

## 2018-03-14 MED ORDER — PROMETHAZINE HCL 25 MG PO TABS
25.00 | ORAL_TABLET | ORAL | Status: DC
Start: ? — End: 2018-03-14

## 2018-03-14 MED ORDER — CLONIDINE HCL 0.1 MG PO TABS
0.10 | ORAL_TABLET | ORAL | Status: DC
Start: ? — End: 2018-03-14

## 2018-03-14 MED ORDER — IBUPROFEN 400 MG PO TABS
400.00 | ORAL_TABLET | ORAL | Status: DC
Start: ? — End: 2018-03-14

## 2018-03-14 MED ORDER — TRAZODONE HCL 100 MG PO TABS
100.00 | ORAL_TABLET | ORAL | Status: DC
Start: ? — End: 2018-03-14

## 2018-03-14 MED ORDER — ZIPRASIDONE MESYLATE 20 MG IM SOLR
20.00 | INTRAMUSCULAR | Status: DC
Start: ? — End: 2018-03-14

## 2018-03-14 MED ORDER — AMANTADINE HCL 100 MG PO CAPS
100.00 | ORAL_CAPSULE | ORAL | Status: DC
Start: 2018-03-14 — End: 2018-03-14

## 2018-03-14 MED ORDER — METHOCARBAMOL 750 MG PO TABS
1500.00 | ORAL_TABLET | ORAL | Status: DC
Start: ? — End: 2018-03-14

## 2018-03-14 MED ORDER — GABAPENTIN 300 MG PO CAPS
300.00 | ORAL_CAPSULE | ORAL | Status: DC
Start: 2018-03-14 — End: 2018-03-14

## 2018-03-14 MED ORDER — METFORMIN HCL ER 500 MG PO TB24
1000.00 | ORAL_TABLET | ORAL | Status: DC
Start: 2018-03-15 — End: 2018-03-14

## 2018-03-14 MED ORDER — DEXTROSE 50 % IV SOLN
12.00 g | INTRAVENOUS | Status: DC
Start: ? — End: 2018-03-14

## 2018-03-14 MED ORDER — LORAZEPAM 2 MG/ML IJ SOLN
2.00 | INTRAMUSCULAR | Status: DC
Start: ? — End: 2018-03-14

## 2018-03-14 MED ORDER — CITALOPRAM HYDROBROMIDE 20 MG PO TABS
20.00 | ORAL_TABLET | ORAL | Status: DC
Start: 2018-03-15 — End: 2018-03-14

## 2018-03-14 MED ORDER — GLUCOSE 40 % PO GEL
15.00 g | ORAL | Status: DC
Start: ? — End: 2018-03-14

## 2018-03-14 MED ORDER — LISINOPRIL 20 MG PO TABS
40.00 | ORAL_TABLET | ORAL | Status: DC
Start: 2018-03-15 — End: 2018-03-14

## 2018-03-14 MED ORDER — GENERIC EXTERNAL MEDICATION
1.00 | Status: DC
Start: ? — End: 2018-03-14

## 2018-04-01 ENCOUNTER — Telehealth: Payer: Self-pay | Admitting: Internal Medicine

## 2018-04-01 NOTE — Telephone Encounter (Signed)
Copied from [place] [phone]. Topic: Quick Communication - See Telephone Encounter >> Apr 01, 2018 12:16 PM Ether Griffins B wrote: CRM for notification. See Telephone encounter for: 04/01/18.  Verita Lamb is calling in wanting to know what direction does Javonta need to go and what Dr. Burnice Logan has in mind for [facility] treatment.  Insurance states he needs to be inpatient for 90 days.

## 2018-04-04 ENCOUNTER — Ambulatory Visit (INDEPENDENT_AMBULATORY_CARE_PROVIDER_SITE_OTHER): Payer: Medicare Other | Admitting: Family Medicine

## 2018-04-04 DIAGNOSIS — E538 Deficiency of other specified B group vitamins: Secondary | ICD-10-CM

## 2018-04-04 MED ORDER — CYANOCOBALAMIN 1000 MCG/ML IJ SOLN
1000.0000 ug | Freq: Once | INTRAMUSCULAR | Status: AC
  Administered 2018-04-04: 1000 ug via INTRAMUSCULAR

## 2018-04-04 NOTE — Telephone Encounter (Signed)
Please schedule return office visit with patient and mother-in-law

## 2018-04-04 NOTE — Progress Notes (Signed)
Per orders of Dr. Banks, injection of Vitamin B 12 given by NIMMONS, SYLVIA ANN. Patient tolerated injection well. 

## 2018-04-04 NOTE — Telephone Encounter (Signed)
Spoke to patient's mother-in-law and informed her that Dr.Kwiatkowski will be in in the a.m. She verbalized understanding.

## 2018-04-05 NOTE — Telephone Encounter (Signed)
Appt scheduled as directed.  Patient's mother states that she called the patient's insurance and they recommended treatment at either [facility] or [facility]. She called both hospitals and was told that both were inpatient programs, but the insurance company told her one was inpatient and one was outpatient. She will call the insurance company to clarify prior to next appointment.  She also states the patient was started on some new medications since hospital discharge and she has noticed the last few days that the skin on his neck and arms is a little red and irritated. She is not sure if this is a medication reaction or related to seasonal allergies. Instructed her to monitor his skin and notify the office of any worsening/spreading redness, blistering, or hives and she verbalized understanding and agreed with plan.

## 2018-04-07 ENCOUNTER — Telehealth: Payer: Self-pay | Admitting: Family Medicine

## 2018-04-07 NOTE — Telephone Encounter (Signed)
Copied from [place] [phone]. Topic: Appointment Scheduling - Scheduling Inquiry for Clinic >> Apr 07, 2018 10:54 AM Ether Griffins B wrote: Reason for CRM: pt's mother requesting he also be transferred to Dr. Sarajane Jews as well so they can all see the  same provider.

## 2018-04-11 NOTE — Telephone Encounter (Signed)
Sorry but I am too full to take him

## 2018-04-12 NOTE — Telephone Encounter (Signed)
Pt has been schedule with Dr. Martinique.

## 2018-04-13 ENCOUNTER — Other Ambulatory Visit: Payer: Self-pay | Admitting: Internal Medicine

## 2018-04-13 DIAGNOSIS — I10 Essential (primary) hypertension: Secondary | ICD-10-CM

## 2018-04-14 ENCOUNTER — Encounter: Payer: Self-pay | Admitting: Internal Medicine

## 2018-04-14 ENCOUNTER — Ambulatory Visit (INDEPENDENT_AMBULATORY_CARE_PROVIDER_SITE_OTHER): Payer: Medicare Other | Admitting: Internal Medicine

## 2018-04-14 VITALS — BP 108/72 | HR 83 | Temp 98.3°F | Wt 193.0 lb

## 2018-04-14 DIAGNOSIS — F1020 Alcohol dependence, uncomplicated: Secondary | ICD-10-CM

## 2018-04-14 DIAGNOSIS — F1910 Other psychoactive substance abuse, uncomplicated: Secondary | ICD-10-CM

## 2018-04-14 DIAGNOSIS — I10 Essential (primary) hypertension: Secondary | ICD-10-CM

## 2018-04-14 NOTE — Patient Instructions (Signed)
Please follow-up with insurance approved Drug/Alcohol counseling centers as discussed Return here in 3 months for follow-up

## 2018-04-14 NOTE — Progress Notes (Signed)
Subjective:    Patient ID: Anthony [name], male    DOB: 1961-07-03, 57 y.o.   MRN: [id number]  HPI  57 year old patient who is seen today for follow-up following a hospital admission last month for alcoholism and detoxification.  He has been living with his mother-in-law and has been abstinent since his discharge.  She has discussed further outpatient rehab with both behavioral health and also her insurance plan.  She has 2 sources for further outpatient rehab which she plans to pursue. Clinically Yahia is doing well;  he does have a history of diabetes and hypertension  Lab Results  Component Value Date   HGBA1C 5.2 02/07/2018   Past Medical History:  Diagnosis Date  . ADD (attention deficit disorder with hyperactivity)   . Anxiety   . Diabetes mellitus   . GERD (gastroesophageal reflux disease)   . Hx of colonic polyp - sessile serrated polyp 12/07/2014  . Hyperlipidemia   . Hypertension      Social History   Socioeconomic History  . Marital status: Single    Spouse name: Not on file  . Number of children: Not on file  . Years of education: Not on file  . Highest education level: Not on file  Occupational History  . Not on file  Social Needs  . Financial resource strain: Not on file  . Food insecurity:    Worry: Not on file    Inability: Not on file  . Transportation needs:    Medical: Not on file    Non-medical: Not on file  Tobacco Use  . Smoking status: Former Smoker    Last attempt to quit: 08/24/1996    Years since quitting: 21.6  . Smokeless tobacco: Never Used  Substance and Sexual Activity  . Alcohol use: Yes    Alcohol/week: 0.0 oz    Comment: 3-40 oz a day of beer   . Drug use: No  . Sexual activity: Not on file  Lifestyle  . Physical activity:    Days per week: Not on file    Minutes per session: Not on file  . Stress: Not on file  Relationships  . Social connections:    Talks on phone: Not on file    Gets together: Not on file    Attends  religious service: Not on file    Active member of club or organization: Not on file    Attends meetings of clubs or organizations: Not on file    Relationship status: Not on file  . Intimate partner violence:    Fear of current or ex partner: Not on file    Emotionally abused: Not on file    Physically abused: Not on file    Forced sexual activity: Not on file  Other Topics Concern  . Not on file  Social History Narrative  . Not on file    Past Surgical History:  Procedure Laterality Date  . PILONIDAL CYST EXCISION      Family History  Problem Relation Age of Onset  . Brain cancer Mother   . Diabetes Father   . Diabetes Sister   . Stroke Brother   . Seizures Brother   . Colon cancer Neg Hx   . Esophageal cancer Neg Hx   . Rectal cancer Neg Hx   . Stomach cancer Neg Hx     No Known Allergies  Current Outpatient Medications on File Prior to Visit  Medication Sig Dispense Refill  . ACCU-CHEK AVIVA PLUS  test strip USE TO TEST BLOOD SUGAR DAILY. 100 each 4  . ACCU-CHEK SOFTCLIX LANCETS lancets USE TO TEST BLOOD SUGAR DAILY. 100 each 4  . acetaminophen (TYLENOL) 325 MG tablet Take 650 mg by mouth every 6 (six) hours as needed for moderate pain.    Marland Kitchen amantadine (SYMMETREL) 100 MG capsule Take 1 capsule by mouth 3 (three) times daily.    Marland Kitchen atorvastatin (LIPITOR) 40 MG tablet TAKE ONE (1) TABLET EACH DAY 90 tablet 3  . citalopram (CELEXA) 10 MG tablet TAKE ONE (1) TABLET EACH DAY 90 tablet 3  . fluticasone (FLONASE) 50 MCG/ACT nasal spray USE 2 SPRAYS IN BOTH NOSTRILS ONCE DAILY. SHAKE WELL BEFORE EACH USE. 48 g 3  . gabapentin (NEURONTIN) 300 MG capsule TAKE ONE CAPSULE BY MOUTH TWICE A DAY 180 capsule 3  . ibuprofen (ADVIL,MOTRIN) 200 MG tablet Take 600 mg by mouth every 6 (six) hours as needed for mild pain.    Marland Kitchen labetalol (NORMODYNE) 300 MG tablet TAKE ONE-HALF TABLET (150MG ) BY MOUTH TWICE DAILY. 180 tablet 5  . metFORMIN (GLUCOPHAGE) 1000 MG tablet TAKE ONE TABLET TWICE  DAILY WITH MEALS 180 tablet 3  . Multiple Vitamin (MULTIVITAMIN WITH MINERALS) TABS tablet Take 1 tablet by mouth daily.    Marland Kitchen topiramate (TOPAMAX) 50 MG tablet Take 50 mg by mouth 2 (two) times daily.     No current facility-administered medications on file prior to visit.     BP 108/72 (BP Location: Right Arm, Patient Position: Sitting, Cuff Size: Normal)   Pulse 83   Temp 98.3 F (36.8 C) (Oral)   Wt 193 lb (87.5 kg)   SpO2 96%   BMI 29.35 kg/m     Review of Systems  Constitutional: Negative for appetite change, chills, fatigue and fever.  HENT: Negative for congestion, dental problem, ear pain, hearing loss, sore throat, tinnitus, trouble swallowing and voice change.   Eyes: Negative for pain, discharge and visual disturbance.  Respiratory: Negative for cough, chest tightness, wheezing and stridor.   Cardiovascular: Negative for chest pain, palpitations and leg swelling.  Gastrointestinal: Negative for abdominal distention, abdominal pain, blood in stool, constipation, diarrhea, nausea and vomiting.  Genitourinary: Negative for difficulty urinating, discharge, flank pain, genital sores, hematuria and urgency.  Musculoskeletal: Negative for arthralgias, back pain, gait problem, joint swelling, myalgias and neck stiffness.  Skin: Negative for rash.  Neurological: Negative for dizziness, syncope, speech difficulty, weakness, numbness and headaches.  Hematological: Negative for adenopathy. Does not bruise/bleed easily.  Psychiatric/Behavioral: Negative for agitation, behavioral problems, confusion and dysphoric mood. The patient is not nervous/anxious and is not hyperactive.        Objective:   Physical Exam  Constitutional: He is oriented to person, place, and time. He appears well-developed. No distress.  Calm no distress Blood pressure low normal without tachycardia  HENT:  Head: Normocephalic.  Right Ear: External ear normal.  Left Ear: External ear normal.  Eyes:  Conjunctivae and EOM are normal.  Neck: Normal range of motion.  Cardiovascular: Normal rate and normal heart sounds.  Pulmonary/Chest: Breath sounds normal.  Abdominal: Bowel sounds are normal.  Musculoskeletal: Normal range of motion. He exhibits no edema or tenderness.  Neurological: He is alert and oriented to person, place, and time.  No hyperreflexia  Psychiatric: He has a normal mood and affect. His behavior is normal.          Assessment & Plan:   Alcoholism.  Status post recent inpatient admit for detoxification Substance abuse.  Urine drug screen positive for cocaine Diabetes mellitus stable Essential hypertension  Patient and family plan to proceed with further outpatient rehabilitation.  Mother was aware that patient cannot safely be left alone.  Likely will need permanent residence in a group home type situation  Return in 3 months for follow-up  Leon \

## 2018-04-18 ENCOUNTER — Telehealth (HOSPITAL_COMMUNITY): Payer: Self-pay | Admitting: Psychology

## 2018-04-18 ENCOUNTER — Other Ambulatory Visit: Payer: Self-pay | Admitting: Internal Medicine

## 2018-04-18 NOTE — Telephone Encounter (Signed)
Copied from [place] [id number]. Topic: Quick Communication - Rx Refill/Question >> Apr 18, 2018 11:48 AM Yvette Rack wrote: Medication:amantadine (SYMMETREL) 100 MG capsule       topiramate (TOPAMAX) 50 MG tablet  citalopram (CELEXA) 10 MG tablet  Has the patient contacted their pharmacy? Yes.  They reached out to the practice  (Agent: If no, request that the patient contact the pharmacy for the refill.) Preferred Pharmacy (with phone number or street name): [place], [place] [phone] (Phone) [phone] (Fax)     Agent: Please be advised that RX refills may take up to 3 business days. We ask that you follow-up with your pharmacy.

## 2018-04-18 NOTE — Telephone Encounter (Signed)
Patient called and spoke to [facility], she was informed the Citalopram was sent to [facility] on 02/07/18 90 tab/3 refills, she says it should have gone to [facility]. Advised to call [facility] to ask if they can call [facility] to retrieve the prescription, she verbalized understanding. I advised I would send the request for Amantadine and Topiramate to Dr. Inda Merlin for a 90 day supply as the patient requests, she verbalized understanding.  Last OV:04/14/18 Last filled: Both meds on 04/05/18 by historical provider PCP: [facility]: [place], [place] [phone] (Phone) [phone] (Fax)

## 2018-04-18 NOTE — Telephone Encounter (Signed)
Anthony [name] at [facility] states the  citalopram (CELEXA) 10 MG tablet was increased to 20 mg when he was in rehab. Pt has appt 4/25 and was supposed to be changed then.. They are requesting a 90 day to  [place], [place] [phone] (Phone) [phone] (Fax)    All scripts should be 90 days

## 2018-04-20 NOTE — Telephone Encounter (Signed)
Okay to change from 10mg  - 20 mg? Please advise

## 2018-04-20 NOTE — Telephone Encounter (Signed)
Okay for 30-day refill

## 2018-04-21 MED ORDER — AMANTADINE HCL 100 MG PO CAPS: 100.0000 mg | ORAL_CAPSULE | Freq: Three times a day (TID) | ORAL | 2 refills | Status: DC

## 2018-04-21 MED ORDER — CITALOPRAM HYDROBROMIDE 20 MG PO TABS: 20.0000 mg | ORAL_TABLET | Freq: Every day | ORAL | 0 refills | Status: DC

## 2018-04-21 MED ORDER — TOPIRAMATE 50 MG PO TABS: 50.0000 mg | ORAL_TABLET | Freq: Two times a day (BID) | ORAL | 2 refills | Status: DC

## 2018-04-21 NOTE — Telephone Encounter (Signed)
Called Anthony [name] to inform her of update regarding Rx.

## 2018-05-05 ENCOUNTER — Telehealth: Payer: Self-pay | Admitting: Internal Medicine

## 2018-05-05 ENCOUNTER — Ambulatory Visit (INDEPENDENT_AMBULATORY_CARE_PROVIDER_SITE_OTHER): Payer: Medicare Other | Admitting: Family Medicine

## 2018-05-05 DIAGNOSIS — E538 Deficiency of other specified B group vitamins: Secondary | ICD-10-CM | POA: Diagnosis not present

## 2018-05-05 MED ORDER — CYANOCOBALAMIN 1000 MCG/ML IJ SOLN
1000.0000 ug | Freq: Once | INTRAMUSCULAR | Status: AC
  Administered 2018-05-05: 1000 ug via INTRAMUSCULAR

## 2018-05-05 NOTE — Progress Notes (Signed)
Per orders of Dr. Kwiatkowski, injection of Vitamin B 12 given by NIMMONS, SYLVIA ANN. Patient tolerated injection well. 

## 2018-05-05 NOTE — Telephone Encounter (Signed)
When patient was in [facility] he received two medications that he said he needs filled.  He is requesting to talk to Dr Marthann Schiller nurse.    Medications:  Amantadine 100 mg capsule - 1 capsule 3 times daily Topiramate 50 mg tablet - 50 mg by mouth 2 times daily  Pharmacy: [place] [facility] on [address]

## 2018-05-06 NOTE — Telephone Encounter (Signed)
Medication was filled on 04/21/2018.

## 2018-05-06 NOTE — Telephone Encounter (Signed)
Left message to return call to see if he received Rx.

## 2018-05-09 ENCOUNTER — Ambulatory Visit: Payer: Medicare Other | Admitting: Internal Medicine

## 2018-05-18 ENCOUNTER — Other Ambulatory Visit: Payer: Self-pay

## 2018-05-18 NOTE — Patient Outreach (Signed)
[facility]) Care Management  05/18/2018  Anthony [name] 11-Jul-1961 [id number]   Medication Adherence call to Anthony [name] patient did not answer phone just ring. [place] said patient has not pick up his Invokana 300 mg since 12/2017 [place]  gave a new number but phone is always busy not sure if it is a correct number [phone] Anthony [name] is showing past due under [place].   [facility] Management Direct Dial [phone]  Fax [phone] [email or web address]

## 2018-05-24 ENCOUNTER — Encounter: Payer: Self-pay | Admitting: Internal Medicine

## 2018-05-24 ENCOUNTER — Ambulatory Visit (INDEPENDENT_AMBULATORY_CARE_PROVIDER_SITE_OTHER): Payer: Medicare Other | Admitting: Internal Medicine

## 2018-05-24 VITALS — BP 102/60 | HR 75 | Temp 97.6°F | Wt 196.0 lb

## 2018-05-24 DIAGNOSIS — I10 Essential (primary) hypertension: Secondary | ICD-10-CM

## 2018-05-24 DIAGNOSIS — F1020 Alcohol dependence, uncomplicated: Secondary | ICD-10-CM

## 2018-05-24 DIAGNOSIS — F88 Other disorders of psychological development: Secondary | ICD-10-CM | POA: Diagnosis not present

## 2018-05-24 DIAGNOSIS — Z550 Illiteracy and low-level literacy: Secondary | ICD-10-CM

## 2018-05-24 DIAGNOSIS — E119 Type 2 diabetes mellitus without complications: Secondary | ICD-10-CM | POA: Diagnosis not present

## 2018-05-24 MED ORDER — LISINOPRIL 10 MG PO TABS: 10.0000 mg | ORAL_TABLET | Freq: Every day | ORAL | 3 refills | Status: DC

## 2018-05-24 NOTE — Progress Notes (Signed)
Subjective:    Patient ID: Anthony [name], male    DOB: July 13, 1961, 57 y.o.   MRN: [id number]  HPI  Lab Results  Component Value Date   HGBA1C 5.2 02/07/2018   57 year old patient who has a history of type 2 diabetes and essential hypertension. He has been hospitalized recently for alcoholism and remains abstinent.  He has a history of developmental mental disorder as well as substance abuse.  He is here  and accompanied by his ex-mother-in-law today who assists with much of his care. He lives independently.  She is concerned about compliance with his medications since he is unable to read.  She is requesting some home health assistance  He has a history of hypertension and has been on lisinopril and labetalol.  Apparently he is only taken labetalol at this time 150 mg twice daily blood pressure has been much better controlled since he has been off alcohol and drugs  He states he feels very well today.  Medical regimen includes gabapentin as well as Topamax, that were initiated as part of his drug therapy.  He is followed in [place]   Past Medical History:  Diagnosis Date  . ADD (attention deficit disorder with hyperactivity)   . Anxiety   . Diabetes mellitus   . GERD (gastroesophageal reflux disease)   . Hx of colonic polyp - sessile serrated polyp 12/07/2014  . Hyperlipidemia   . Hypertension      Social History   Socioeconomic History  . Marital status: Single    Spouse name: Not on file  . Number of children: Not on file  . Years of education: Not on file  . Highest education level: Not on file  Occupational History  . Not on file  Social Needs  . Financial resource strain: Not on file  . Food insecurity:    Worry: Not on file    Inability: Not on file  . Transportation needs:    Medical: Not on file    Non-medical: Not on file  Tobacco Use  . Smoking status: Former Smoker    Last attempt to quit: 08/24/1996    Years since quitting: 21.7  . Smokeless tobacco:  Never Used  Substance and Sexual Activity  . Alcohol use: Yes    Alcohol/week: 0.0 oz    Comment: 3-40 oz a day of beer   . Drug use: No  . Sexual activity: Not on file  Lifestyle  . Physical activity:    Days per week: Not on file    Minutes per session: Not on file  . Stress: Not on file  Relationships  . Social connections:    Talks on phone: Not on file    Gets together: Not on file    Attends religious service: Not on file    Active member of club or organization: Not on file    Attends meetings of clubs or organizations: Not on file    Relationship status: Not on file  . Intimate partner violence:    Fear of current or ex partner: Not on file    Emotionally abused: Not on file    Physically abused: Not on file    Forced sexual activity: Not on file  Other Topics Concern  . Not on file  Social History Narrative  . Not on file    Past Surgical History:  Procedure Laterality Date  . PILONIDAL CYST EXCISION      Family History  Problem Relation Age of Onset  .  Brain cancer Mother   . Diabetes Father   . Diabetes Sister   . Stroke Brother   . Seizures Brother   . Colon cancer Neg Hx   . Esophageal cancer Neg Hx   . Rectal cancer Neg Hx   . Stomach cancer Neg Hx     No Known Allergies  Current Outpatient Medications on File Prior to Visit  Medication Sig Dispense Refill  . ACCU-CHEK AVIVA PLUS test strip USE TO TEST BLOOD SUGAR DAILY. 100 each 4  . ACCU-CHEK SOFTCLIX LANCETS lancets USE TO TEST BLOOD SUGAR DAILY. 100 each 4  . acetaminophen (TYLENOL) 325 MG tablet Take 650 mg by mouth every 6 (six) hours as needed for moderate pain.    Marland Kitchen atorvastatin (LIPITOR) 40 MG tablet TAKE ONE (1) TABLET EACH DAY 90 tablet 3  . citalopram (CELEXA) 20 MG tablet Take 1 tablet (20 mg total) by mouth daily. 30 tablet 0  . gabapentin (NEURONTIN) 300 MG capsule TAKE ONE CAPSULE BY MOUTH TWICE A DAY 180 capsule 3  . labetalol (NORMODYNE) 300 MG tablet TAKE ONE-HALF TABLET  (150MG ) BY MOUTH TWICE DAILY. 180 tablet 5  . metFORMIN (GLUCOPHAGE) 1000 MG tablet TAKE ONE TABLET TWICE DAILY WITH MEALS 180 tablet 3  . Multiple Vitamin (MULTIVITAMIN WITH MINERALS) TABS tablet Take 1 tablet by mouth daily.    Marland Kitchen topiramate (TOPAMAX) 50 MG tablet Take 1 tablet (50 mg total) by mouth 2 (two) times daily. 60 tablet 2   No current facility-administered medications on file prior to visit.     BP 102/60 (BP Location: Right Arm, Patient Position: Sitting, Cuff Size: Large)   Pulse 75   Temp 97.6 F (36.4 C) (Oral)   Wt 196 lb (88.9 kg)   SpO2 98%   BMI 29.80 kg/m      Review of Systems  Constitutional: Negative for appetite change, chills, fatigue and fever.  HENT: Negative for congestion, dental problem, ear pain, hearing loss, sore throat, tinnitus, trouble swallowing and voice change.   Eyes: Negative for pain, discharge and visual disturbance.  Respiratory: Negative for cough, chest tightness, wheezing and stridor.   Cardiovascular: Negative for chest pain, palpitations and leg swelling.  Gastrointestinal: Negative for abdominal distention, abdominal pain, blood in stool, constipation, diarrhea, nausea and vomiting.  Genitourinary: Negative for difficulty urinating, discharge, flank pain, genital sores, hematuria and urgency.  Musculoskeletal: Negative for arthralgias, back pain, gait problem, joint swelling, myalgias and neck stiffness.  Skin: Negative for rash.  Neurological: Negative for dizziness, syncope, speech difficulty, weakness, numbness and headaches.  Hematological: Negative for adenopathy. Does not bruise/bleed easily.  Psychiatric/Behavioral: Positive for confusion. Negative for behavioral problems and dysphoric mood. The patient is not nervous/anxious.        Objective:   Physical Exam  Constitutional: He is oriented to person, place, and time. He appears well-developed.  Blood pressure 110/70  HENT:  Head: Normocephalic.  Right Ear: External  ear normal.  Left Ear: External ear normal.  Eyes: Conjunctivae and EOM are normal.  Neck: Normal range of motion.  Cardiovascular: Normal rate and normal heart sounds.  Pulmonary/Chest: Breath sounds normal.  Abdominal: Bowel sounds are normal.  Obese soft and nontender  Musculoskeletal: Normal range of motion. He exhibits no edema or tenderness.  Neurological: He is alert and oriented to person, place, and time.  Psychiatric: He has a normal mood and affect. His behavior is normal.          Assessment & Plan:  Developmental disorder.  Will place consultation to attempt to obtain some home assistance with medications and other ADLs Diabetes well controlled Alcoholism/substance abuse disorder.  Presently stable Hypertension.  Blood pressure is low normal today apparently he is on labetalol 150 mg twice daily.  We will slowly taper and discontinue over 6 weeks and restart lisinopril  Nyoka Cowden

## 2018-05-24 NOTE — Patient Instructions (Addendum)
Lisinopril 10 mg once daily  Decrease labetalol 1/2 tablet (150 mg) daily for 3 weeks, then decrease to 1/2 tablet (150 mg) every other day for 3 weeks , then discontinue   Please check your hemoglobin A1c every 3 months

## 2018-06-02 ENCOUNTER — Other Ambulatory Visit: Payer: Self-pay | Admitting: Internal Medicine

## 2018-06-06 ENCOUNTER — Ambulatory Visit (INDEPENDENT_AMBULATORY_CARE_PROVIDER_SITE_OTHER): Payer: Medicare Other | Admitting: *Deleted

## 2018-06-06 DIAGNOSIS — E538 Deficiency of other specified B group vitamins: Secondary | ICD-10-CM

## 2018-06-06 MED ORDER — CYANOCOBALAMIN 1000 MCG/ML IJ SOLN
1000.0000 ug | Freq: Once | INTRAMUSCULAR | Status: AC
  Administered 2018-06-06: 1000 ug via INTRAMUSCULAR

## 2018-06-06 NOTE — Progress Notes (Signed)
Per orders of Dr. Kwiatkowski, injection of B12 given by Arelyn Gauer J Linnaea Ahn. Patient tolerated injection well. 

## 2018-06-07 ENCOUNTER — Telehealth: Payer: Self-pay | Admitting: Internal Medicine

## 2018-06-07 NOTE — Telephone Encounter (Signed)
Copied from [place] [id number] [id number]. Topic: Quick Communication - See Telephone Encounter >> Jun 07, 2018  2:05 PM Alfredia Ferguson R wrote: CRM for notification. See Telephone encounter for: 06/07/18. Pts responsible party was calling to see status of home health nurse coming to home. Please call Verita Lamb [phone] to update her on information

## 2018-06-07 NOTE — Telephone Encounter (Signed)
Please check on status of home health referral.  Reorder if necessary

## 2018-06-08 NOTE — Telephone Encounter (Signed)
The referral has been sent to an agency twice and Anthony [name] does not know what the hold up is. Anthony [name] will send to another [facility] agency in hope to expedite the services. Will inform Pt and mother-in-law.

## 2018-06-10 NOTE — Telephone Encounter (Signed)
Beth with [facility] calling because they are unable to reach the patient for home health services. She has the same numbers we have on file. She would like a call back for referral coordinator.

## 2018-07-04 ENCOUNTER — Ambulatory Visit: Payer: Medicare Other

## 2018-07-05 NOTE — Telephone Encounter (Signed)
Copied from [place] [id number]. Topic: General - Other >> Jul 05, 2018  3:51 PM Synthia Innocent wrote: Reason for CRM: FYI, patient is in jail, will get out, hopefully in August.

## 2018-07-11 ENCOUNTER — Ambulatory Visit: Payer: Medicare Other | Admitting: Internal Medicine

## 2018-08-02 ENCOUNTER — Encounter: Payer: Self-pay | Admitting: Internal Medicine

## 2018-08-02 ENCOUNTER — Ambulatory Visit (INDEPENDENT_AMBULATORY_CARE_PROVIDER_SITE_OTHER): Payer: Medicare Other | Admitting: Internal Medicine

## 2018-08-02 VITALS — BP 128/62 | HR 112 | Temp 98.4°F | Wt 183.2 lb

## 2018-08-02 DIAGNOSIS — E538 Deficiency of other specified B group vitamins: Secondary | ICD-10-CM | POA: Diagnosis not present

## 2018-08-02 DIAGNOSIS — F489 Nonpsychotic mental disorder, unspecified: Secondary | ICD-10-CM

## 2018-08-02 DIAGNOSIS — F1020 Alcohol dependence, uncomplicated: Secondary | ICD-10-CM

## 2018-08-02 DIAGNOSIS — I10 Essential (primary) hypertension: Secondary | ICD-10-CM | POA: Diagnosis not present

## 2018-08-02 DIAGNOSIS — E119 Type 2 diabetes mellitus without complications: Secondary | ICD-10-CM

## 2018-08-02 DIAGNOSIS — F69 Unspecified disorder of adult personality and behavior: Secondary | ICD-10-CM

## 2018-08-02 LAB — POCT GLYCOSYLATED HEMOGLOBIN (HGB A1C): HEMOGLOBIN A1C: 7.1 % — AB (ref 4.0–5.6)

## 2018-08-02 MED ORDER — CYANOCOBALAMIN 1000 MCG/ML IJ SOLN
1000.0000 ug | Freq: Once | INTRAMUSCULAR | Status: AC
  Administered 2018-08-02: 1000 ug via INTRAMUSCULAR

## 2018-08-02 NOTE — Progress Notes (Signed)
Subjective:    Patient ID: Anthony [name], male    DOB: Mar 18, 1961, 57 y.o.   MRN: [id number]  HPI  57 year old patient who is seen today for his 70-month follow-up.  He has a history of essential hypertension which has been controlled with lisinopril.  He has type 2 diabetes controlled on oral medications. Since his last visit here he has spent 2 months in jail. He is unclear of the medications he is taking but does bring prescription bottles for all his current medications  Lab Results  Component Value Date   HGBA1C 7.1 (A) 08/02/2018    He has a history of alcoholism and unfortunately following release from jail has started drinking beer again.  He is accompanied by his former mother-in-law.  She tries to help manage his daily affairs but patient does live independently.  Prior to his incarceration he was participating with AA and follow-up with behavioral health specialist for his alcoholism  Past Medical History:  Diagnosis Date  . ADD (attention deficit disorder with hyperactivity)   . Anxiety   . Diabetes mellitus   . GERD (gastroesophageal reflux disease)   . Hx of colonic polyp - sessile serrated polyp 12/07/2014  . Hyperlipidemia   . Hypertension      Social History   Socioeconomic History  . Marital status: Single    Spouse name: Not on file  . Number of children: Not on file  . Years of education: Not on file  . Highest education level: Not on file  Occupational History  . Not on file  Social Needs  . Financial resource strain: Not on file  . Food insecurity:    Worry: Not on file    Inability: Not on file  . Transportation needs:    Medical: Not on file    Non-medical: Not on file  Tobacco Use  . Smoking status: Former Smoker    Last attempt to quit: 08/24/1996    Years since quitting: 21.9  . Smokeless tobacco: Never Used  Substance and Sexual Activity  . Alcohol use: Yes    Alcohol/week: 0.0 standard drinks    Comment: 3-40 oz a day of beer   .  Drug use: No  . Sexual activity: Not on file  Lifestyle  . Physical activity:    Days per week: Not on file    Minutes per session: Not on file  . Stress: Not on file  Relationships  . Social connections:    Talks on phone: Not on file    Gets together: Not on file    Attends religious service: Not on file    Active member of club or organization: Not on file    Attends meetings of clubs or organizations: Not on file    Relationship status: Not on file  . Intimate partner violence:    Fear of current or ex partner: Not on file    Emotionally abused: Not on file    Physically abused: Not on file    Forced sexual activity: Not on file  Other Topics Concern  . Not on file  Social History Narrative  . Not on file    Past Surgical History:  Procedure Laterality Date  . PILONIDAL CYST EXCISION      Family History  Problem Relation Age of Onset  . Brain cancer Mother   . Diabetes Father   . Diabetes Sister   . Stroke Brother   . Seizures Brother   . Colon cancer  Neg Hx   . Esophageal cancer Neg Hx   . Rectal cancer Neg Hx   . Stomach cancer Neg Hx     No Known Allergies  Current Outpatient Medications on File Prior to Visit  Medication Sig Dispense Refill  . ACCU-CHEK AVIVA PLUS test strip USE TO TEST BLOOD SUGAR DAILY. 100 each 0  . ACCU-CHEK SOFTCLIX LANCETS lancets USE TO TEST BLOOD SUGAR DAILY. 100 each 4  . acetaminophen (TYLENOL) 325 MG tablet Take 650 mg by mouth every 6 (six) hours as needed for moderate pain.    Marland Kitchen amantadine (SYMMETREL) 100 MG capsule Take by mouth.    Marland Kitchen atorvastatin (LIPITOR) 40 MG tablet TAKE ONE (1) TABLET EACH DAY 90 tablet 3  . canagliflozin (INVOKANA) 300 MG TABS tablet Take by mouth.    . citalopram (CELEXA) 20 MG tablet Take 1 tablet (20 mg total) by mouth daily. 30 tablet 0  . clotrimazole (LOTRIMIN) 1 % cream APPLY TO AFFECTED AREA(S) TOPICALLY TWICE DAILY. 30 g 0  . fluticasone (FLONASE) 50 MCG/ACT nasal spray USE TWO SPRAYS IN  BOTH NOSTRIL DAILY    . gabapentin (NEURONTIN) 300 MG capsule TAKE ONE CAPSULE BY MOUTH TWICE A DAY 180 capsule 3  . glipiZIDE (GLUCOTROL XL) 5 MG 24 hr tablet TAKE ONE (1) TABLET EACH DAY    . lisinopril (PRINIVIL,ZESTRIL) 10 MG tablet Take 1 tablet (10 mg total) by mouth daily. 90 tablet 3  . metFORMIN (GLUCOPHAGE) 1000 MG tablet TAKE ONE TABLET TWICE DAILY WITH MEALS 180 tablet 3  . Multiple Vitamin (MULTIVITAMIN WITH MINERALS) TABS tablet Take 1 tablet by mouth daily.    Marland Kitchen topiramate (TOPAMAX) 50 MG tablet Take 1 tablet (50 mg total) by mouth 2 (two) times daily. 60 tablet 2   No current facility-administered medications on file prior to visit.     BP 128/62 (BP Location: Right Arm, Patient Position: Sitting, Cuff Size: Normal)   Pulse (!) 112   Temp 98.4 F (36.9 C) (Oral)   Wt 183 lb 3.2 oz (83.1 kg)   SpO2 95%   BMI 27.86 kg/m     Review of Systems  Constitutional: Negative for appetite change, chills, fatigue and fever.  HENT: Negative for congestion, dental problem, ear pain, hearing loss, sore throat, tinnitus, trouble swallowing and voice change.   Eyes: Negative for pain, discharge and visual disturbance.  Respiratory: Negative for cough, chest tightness, wheezing and stridor.   Cardiovascular: Negative for chest pain, palpitations and leg swelling.  Gastrointestinal: Negative for abdominal distention, abdominal pain, blood in stool, constipation, diarrhea, nausea and vomiting.  Genitourinary: Negative for difficulty urinating, discharge, flank pain, genital sores, hematuria and urgency.  Musculoskeletal: Negative for arthralgias, back pain, gait problem, joint swelling, myalgias and neck stiffness.  Skin: Negative for rash.  Neurological: Negative for dizziness, syncope, speech difficulty, weakness, numbness and headaches.  Hematological: Negative for adenopathy. Does not bruise/bleed easily.  Psychiatric/Behavioral: Positive for decreased concentration. Negative for  behavioral problems and dysphoric mood. The patient is not nervous/anxious.        Objective:   Physical Exam  Constitutional: He is oriented to person, place, and time. He appears well-developed.  Weight 202  HENT:  Head: Normocephalic.  Right Ear: External ear normal.  Left Ear: External ear normal.  Eyes: Conjunctivae and EOM are normal.  Neck: Normal range of motion.  Cardiovascular: Normal rate and normal heart sounds.  Pulmonary/Chest: Breath sounds normal.  Abdominal: Bowel sounds are normal.  Musculoskeletal: Normal range  of motion. He exhibits no edema or tenderness.  Neurological: He is alert and oriented to person, place, and time.  Psychiatric: He has a normal mood and affect. His behavior is normal.          Assessment & Plan:   Hypertension well-controlled Diabetes mellitus.  Reasonable control.  Unclear compliance Alcoholism.  Patient encouraged to follow-up with AA and also his behavioral health specialist who he has been seen following release from detoxification/rehab facility  Will reorder home health care Follow-up in 3 months  Marletta Lor

## 2018-08-02 NOTE — Patient Instructions (Signed)
Discontinue all alcoholic beverages  Please follow-up with your AA meetings  Follow-up with your behavioral health professionals  Return in 3 months for follow-up

## 2018-08-04 NOTE — Addendum Note (Signed)
Addended by: Gwenyth Ober R on: 08/04/2018 02:52 PM   Modules accepted: Orders

## 2018-08-05 ENCOUNTER — Encounter: Payer: Self-pay | Admitting: Internal Medicine

## 2018-08-08 ENCOUNTER — Ambulatory Visit: Payer: Self-pay

## 2018-08-16 ENCOUNTER — Ambulatory Visit: Payer: Self-pay | Admitting: Internal Medicine

## 2018-08-17 ENCOUNTER — Ambulatory Visit (INDEPENDENT_AMBULATORY_CARE_PROVIDER_SITE_OTHER): Payer: Medicare Other | Admitting: Internal Medicine

## 2018-08-17 ENCOUNTER — Encounter: Payer: Self-pay | Admitting: Internal Medicine

## 2018-08-17 VITALS — BP 172/90 | HR 90 | Temp 98.5°F | Wt 185.0 lb

## 2018-08-17 DIAGNOSIS — E119 Type 2 diabetes mellitus without complications: Secondary | ICD-10-CM

## 2018-08-17 DIAGNOSIS — F1020 Alcohol dependence, uncomplicated: Secondary | ICD-10-CM | POA: Diagnosis not present

## 2018-08-17 DIAGNOSIS — I10 Essential (primary) hypertension: Secondary | ICD-10-CM | POA: Diagnosis not present

## 2018-08-17 LAB — COMPREHENSIVE METABOLIC PANEL
ALK PHOS: 135 U/L — AB (ref 39–117)
ALT: 63 U/L — ABNORMAL HIGH (ref 0–53)
AST: 59 U/L — AB (ref 0–37)
Albumin: 4.3 g/dL (ref 3.5–5.2)
BUN: 18 mg/dL (ref 6–23)
CALCIUM: 9.4 mg/dL (ref 8.4–10.5)
CO2: 25 meq/L (ref 19–32)
Chloride: 102 mEq/L (ref 96–112)
Creatinine, Ser: 0.81 mg/dL (ref 0.40–1.50)
GFR: 104.78 mL/min (ref >60.00)
Glucose, Bld: 241 mg/dL — ABNORMAL HIGH (ref 70–99)
Potassium: 4.1 mEq/L (ref 3.5–5.1)
Sodium: 138 mEq/L (ref 135–145)
Total Bilirubin: 0.8 mg/dL (ref 0.2–1.2)
Total Protein: 7.3 g/dL (ref 6.0–8.3)

## 2018-08-17 LAB — CBC WITH DIFFERENTIAL/PLATELET
BASOS ABS: 0 10*3/uL (ref 0.0–0.1)
Basophils Relative: 0.5 % (ref 0.0–3.0)
Eosinophils Absolute: 0.1 10*3/uL (ref 0.0–0.7)
Eosinophils Relative: 1.1 % (ref 0.0–5.0)
HEMATOCRIT: 38.1 % — AB (ref 39.0–52.0)
Hemoglobin: 12.8 g/dL — ABNORMAL LOW (ref 13.0–17.0)
LYMPHS PCT: 18.4 % (ref 12.0–46.0)
Lymphs Abs: 1.7 10*3/uL (ref 0.7–4.0)
MCHC: 33.6 g/dL (ref 30.0–36.0)
MCV: 91.6 fl (ref 78.0–100.0)
MONOS PCT: 7.8 % (ref 3.0–12.0)
Monocytes Absolute: 0.7 10*3/uL (ref 0.1–1.0)
Neutro Abs: 6.8 10*3/uL (ref 1.4–7.7)
Neutrophils Relative %: 72.2 % (ref 43.0–77.0)
Platelets: 175 10*3/uL (ref 150.0–400.0)
RBC: 4.15 Mil/uL — AB (ref 4.22–5.81)
RDW: 16 % — ABNORMAL HIGH (ref 11.5–15.5)
WBC: 9.4 10*3/uL (ref 4.0–10.5)

## 2018-08-17 NOTE — Patient Instructions (Addendum)
Discontinue all alcohol products  Return in 2 months for follow-up patient  Take your antibiotic as prescribed until ALL of it is gone, but stop if you develop a rash, swelling, or any side effects of the medication.  Contact our office as soon as possible if  there are side effects of the medication.

## 2018-08-17 NOTE — Progress Notes (Signed)
Subjective:    Patient ID: Anthony [name], male    DOB: 05-Mar-1961, 57 y.o.   MRN: [id number]  HPI  57 year old patient who has a history of chronic alcoholism essential hypertension and type 2 diabetes. He is accompanied today with his former mother-in-law who assists with his care He has recent been seen by his dentist and placed on amoxicillin for gingivitis.  Apparently dental extractions are planned He was also seen at [facility] behavioral health in [place].  Apparently they were concerned about his eyes.  Unfortunately patient continues to drink. He has also been followed by [facility] behavioral [facility]  Lab Results  Component Value Date   HGBA1C 7.1 (A) 08/02/2018     Past Medical History:  Diagnosis Date  . ADD (attention deficit disorder with hyperactivity)   . Anxiety   . Diabetes mellitus   . GERD (gastroesophageal reflux disease)   . Hx of colonic polyp - sessile serrated polyp 12/07/2014  . Hyperlipidemia   . Hypertension      Social History   Socioeconomic History  . Marital status: Single    Spouse name: Not on file  . Number of children: Not on file  . Years of education: Not on file  . Highest education level: Not on file  Occupational History  . Not on file  Social Needs  . Financial resource strain: Not on file  . Food insecurity:    Worry: Not on file    Inability: Not on file  . Transportation needs:    Medical: Not on file    Non-medical: Not on file  Tobacco Use  . Smoking status: Former Smoker    Last attempt to quit: 08/24/1996    Years since quitting: 21.9  . Smokeless tobacco: Never Used  Substance and Sexual Activity  . Alcohol use: Yes    Alcohol/week: 0.0 standard drinks    Comment: 3-40 oz a day of beer   . Drug use: No  . Sexual activity: Not on file  Lifestyle  . Physical activity:    Days per week: Not on file    Minutes per session: Not on file  . Stress: Not on file  Relationships  . Social connections:    Talks  on phone: Not on file    Gets together: Not on file    Attends religious service: Not on file    Active member of club or organization: Not on file    Attends meetings of clubs or organizations: Not on file    Relationship status: Not on file  . Intimate partner violence:    Fear of current or ex partner: Not on file    Emotionally abused: Not on file    Physically abused: Not on file    Forced sexual activity: Not on file  Other Topics Concern  . Not on file  Social History Narrative  . Not on file    Past Surgical History:  Procedure Laterality Date  . PILONIDAL CYST EXCISION      Family History  Problem Relation Age of Onset  . Brain cancer Mother   . Diabetes Father   . Diabetes Sister   . Stroke Brother   . Seizures Brother   . Colon cancer Neg Hx   . Esophageal cancer Neg Hx   . Rectal cancer Neg Hx   . Stomach cancer Neg Hx     No Known Allergies  Current Outpatient Medications on File Prior to Visit  Medication  Sig Dispense Refill  . ACCU-CHEK AVIVA PLUS test strip USE TO TEST BLOOD SUGAR DAILY. 100 each 0  . ACCU-CHEK SOFTCLIX LANCETS lancets USE TO TEST BLOOD SUGAR DAILY. 100 each 4  . acetaminophen (TYLENOL) 325 MG tablet Take 650 mg by mouth every 6 (six) hours as needed for moderate pain.    Marland Kitchen amantadine (SYMMETREL) 100 MG capsule Take by mouth.    Marland Kitchen atorvastatin (LIPITOR) 40 MG tablet TAKE ONE (1) TABLET EACH DAY 90 tablet 3  . canagliflozin (INVOKANA) 300 MG TABS tablet Take by mouth.    . citalopram (CELEXA) 20 MG tablet Take 1 tablet (20 mg total) by mouth daily. 30 tablet 0  . clotrimazole (LOTRIMIN) 1 % cream APPLY TO AFFECTED AREA(S) TOPICALLY TWICE DAILY. 30 g 0  . fluticasone (FLONASE) 50 MCG/ACT nasal spray USE TWO SPRAYS IN BOTH NOSTRIL DAILY    . gabapentin (NEURONTIN) 300 MG capsule TAKE ONE CAPSULE BY MOUTH TWICE A DAY 180 capsule 3  . glipiZIDE (GLUCOTROL XL) 5 MG 24 hr tablet TAKE ONE (1) TABLET EACH DAY    . lisinopril (PRINIVIL,ZESTRIL)  10 MG tablet Take 1 tablet (10 mg total) by mouth daily. 90 tablet 3  . metFORMIN (GLUCOPHAGE) 1000 MG tablet TAKE ONE TABLET TWICE DAILY WITH MEALS 180 tablet 3  . Multiple Vitamin (MULTIVITAMIN WITH MINERALS) TABS tablet Take 1 tablet by mouth daily.    Marland Kitchen topiramate (TOPAMAX) 50 MG tablet Take 1 tablet (50 mg total) by mouth 2 (two) times daily. 60 tablet 2   No current facility-administered medications on file prior to visit.     BP (!) 172/90 (BP Location: Right Arm, Patient Position: Sitting, Cuff Size: Normal)   Pulse 90   Temp 98.5 F (36.9 C) (Oral)   Wt 185 lb (83.9 kg)   SpO2 98%   BMI 28.13 kg/m    Review of Systems  Constitutional: Positive for activity change and appetite change. Negative for chills, fatigue and fever.  HENT: Positive for dental problem. Negative for congestion, ear pain, hearing loss, sore throat, tinnitus, trouble swallowing and voice change.   Eyes: Negative for pain, discharge and visual disturbance.  Respiratory: Negative for cough, chest tightness, wheezing and stridor.   Cardiovascular: Negative for chest pain, palpitations and leg swelling.  Gastrointestinal: Negative for abdominal distention, abdominal pain, blood in stool, constipation, diarrhea, nausea and vomiting.  Genitourinary: Negative for difficulty urinating, discharge, flank pain, genital sores, hematuria and urgency.  Musculoskeletal: Negative for arthralgias, back pain, gait problem, joint swelling, myalgias and neck stiffness.  Skin: Negative for rash.  Neurological: Negative for dizziness, syncope, speech difficulty, weakness, numbness and headaches.  Hematological: Negative for adenopathy. Does not bruise/bleed easily.  Psychiatric/Behavioral: Negative for behavioral problems and dysphoric mood. The patient is not nervous/anxious.        Objective:   Physical Exam  Constitutional: He is oriented to person, place, and time. He appears well-developed and well-nourished. No  distress.  Blood pressure 150/90  HENT:  Head: Normocephalic.  Right Ear: External ear normal.  Left Ear: External ear normal.  Eyes: Conjunctivae and EOM are normal.  Conjunctival injection but sclerae do not appear to be icteric  Neck: Normal range of motion.  Cardiovascular: Normal rate and normal heart sounds.  Pulmonary/Chest: Breath sounds normal.  Abdominal: Bowel sounds are normal.  No obvious organomegaly  Musculoskeletal: Normal range of motion. He exhibits no edema or tenderness.  Neurological: He is alert and oriented to person, place, and time.  Psychiatric: He has a normal mood and affect. His behavior is normal.          Assessment & Plan:   Chronic alcoholism.  Continue behavioral health follow-up Gingivitis.  Follow-up dental medicine.  Complete amoxicillin Diabetes mellitus type 2.  Hemoglobin A1c 7.1.  Unclear compliance Essential hypertension.  Blood pressure slightly elevated today we will continue to observe on present regimen  Will check updated lab including liver function studies Return in 2 months for follow-up  Marletta Lor

## 2018-08-18 ENCOUNTER — Telehealth: Payer: Self-pay | Admitting: Family Medicine

## 2018-08-18 NOTE — Telephone Encounter (Signed)
Presently no answer at home phone.  Notify patient that laboratory studies suggested alcohol related hepatitis but no jaundice

## 2018-08-18 NOTE — Telephone Encounter (Signed)
Please result and advise 

## 2018-08-18 NOTE — Telephone Encounter (Signed)
Copied from [place] [phone]. Topic: Quick Communication - Lab Results >> Aug 18, 2018 11:08 AM Oliver Pila B wrote: Contact pt for lab results; they were waiting since yesterday since they were told they would be informed yesterday

## 2018-08-18 NOTE — Telephone Encounter (Signed)
Pt mother-in-law was notified about lab results and will notify pt. Inez Catalina wants to know what the plan is regarding Hep C. She is worried because the pt has a colonoscopy in Sep. And dentist appt. and she don't know what to do at this point. And she wants him to get his teeth pulled out due to infection in mouth.

## 2018-08-23 ENCOUNTER — Emergency Department (HOSPITAL_COMMUNITY)
Admission: EM | Admit: 2018-08-23 | Discharge: 2018-08-23 | Disposition: A | Discharge status: Home / Self Care | Payer: Medicare Other | Attending: Emergency Medicine | Admitting: Emergency Medicine

## 2018-08-23 ENCOUNTER — Encounter (HOSPITAL_COMMUNITY): Payer: Self-pay | Admitting: Emergency Medicine

## 2018-08-23 ENCOUNTER — Other Ambulatory Visit: Payer: Self-pay

## 2018-08-23 ENCOUNTER — Emergency Department (HOSPITAL_COMMUNITY): Payer: Medicare Other

## 2018-08-23 DIAGNOSIS — S299XXA Unspecified injury of thorax, initial encounter: Secondary | ICD-10-CM | POA: Diagnosis present

## 2018-08-23 DIAGNOSIS — R0789 Other chest pain: Secondary | ICD-10-CM | POA: Diagnosis not present

## 2018-08-23 DIAGNOSIS — R0781 Pleurodynia: Secondary | ICD-10-CM | POA: Diagnosis not present

## 2018-08-23 DIAGNOSIS — E119 Type 2 diabetes mellitus without complications: Secondary | ICD-10-CM | POA: Diagnosis not present

## 2018-08-23 DIAGNOSIS — Y999 Unspecified external cause status: Secondary | ICD-10-CM | POA: Diagnosis not present

## 2018-08-23 DIAGNOSIS — Z7984 Long term (current) use of oral hypoglycemic drugs: Secondary | ICD-10-CM | POA: Diagnosis not present

## 2018-08-23 DIAGNOSIS — W501XXA Accidental kick by another person, initial encounter: Secondary | ICD-10-CM | POA: Diagnosis not present

## 2018-08-23 DIAGNOSIS — S20211A Contusion of right front wall of thorax, initial encounter: Secondary | ICD-10-CM | POA: Insufficient documentation

## 2018-08-23 DIAGNOSIS — I10 Essential (primary) hypertension: Secondary | ICD-10-CM | POA: Insufficient documentation

## 2018-08-23 DIAGNOSIS — Y9389 Activity, other specified: Secondary | ICD-10-CM | POA: Insufficient documentation

## 2018-08-23 DIAGNOSIS — Y9289 Other specified places as the place of occurrence of the external cause: Secondary | ICD-10-CM | POA: Diagnosis not present

## 2018-08-23 DIAGNOSIS — Z87891 Personal history of nicotine dependence: Secondary | ICD-10-CM | POA: Insufficient documentation

## 2018-08-23 DIAGNOSIS — R079 Chest pain, unspecified: Secondary | ICD-10-CM | POA: Diagnosis not present

## 2018-08-23 MED ORDER — DICLOFENAC SODIUM 50 MG PO TBEC: 50.0000 mg | DELAYED_RELEASE_TABLET | Freq: Two times a day (BID) | ORAL | 0 refills | Status: DC

## 2018-08-23 MED ORDER — METHOCARBAMOL 500 MG PO TABS
500.0000 mg | ORAL_TABLET | Freq: Two times a day (BID) | ORAL | 0 refills | Status: DC
Start: 2018-08-23 — End: 2018-08-30

## 2018-08-23 NOTE — ED Notes (Signed)
Pt taken to xray 
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
## 2018-08-23 NOTE — Discharge Instructions (Signed)
See your Physician for recheck if pain persist ?

## 2018-08-23 NOTE — Telephone Encounter (Signed)
Noted! Will notify Inez Catalina

## 2018-08-23 NOTE — Telephone Encounter (Signed)
Anthony [name] was notified of update and verbalize understanding.

## 2018-08-23 NOTE — Telephone Encounter (Signed)
Patient does not have hepatitis C.  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

## 2018-08-24 ENCOUNTER — Other Ambulatory Visit: Payer: Self-pay
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
## 2018-08-26 ENCOUNTER — Ambulatory Visit (INDEPENDENT_AMBULATORY_CARE_PROVIDER_SITE_OTHER): Payer: Medicare Other | Admitting: Family Medicine

## 2018-08-26 ENCOUNTER — Encounter: Payer: Self-pay | Admitting: Family Medicine

## 2018-08-26 VITALS — BP 150/80 | HR 85 | Temp 98.2°F | Resp 16 | Ht 68.0 in | Wt 186.5 lb

## 2018-08-26 DIAGNOSIS — F419 Anxiety disorder, unspecified: Secondary | ICD-10-CM

## 2018-08-26 DIAGNOSIS — E119 Type 2 diabetes mellitus without complications: Secondary | ICD-10-CM

## 2018-08-26 DIAGNOSIS — I10 Essential (primary) hypertension: Secondary | ICD-10-CM | POA: Diagnosis not present

## 2018-08-26 DIAGNOSIS — T7611XD Adult physical abuse, suspected, subsequent encounter: Secondary | ICD-10-CM

## 2018-08-26 DIAGNOSIS — Z23 Encounter for immunization: Secondary | ICD-10-CM | POA: Diagnosis not present

## 2018-08-26 DIAGNOSIS — F1020 Alcohol dependence, uncomplicated: Secondary | ICD-10-CM | POA: Diagnosis not present
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
## 2018-08-26 NOTE — Patient Instructions (Addendum)
A few things to remember from today's visit:   Essential hypertension  Alcoholism ([place])  Anxiety disorder, unspecified type  Diabetes mellitus without complication ([place])  No changes in medications today. Please arrange appointment at [facility] behavioral health for alcohol abuse. Continue following due to psychiatrist.  Please be sure medication list is accurate. If a new problem present, please set up appointment sooner than planned today.

## 2018-08-26 NOTE — Assessment & Plan Note (Signed)
BP elevated today, it was normal during recent ER visit. For now no changes in current medications but he was instructed to monitor BP daily and to let me know if it is above 140/90. Low-salt diet recommended. Follow-up in 3 months, before if needed.

## 2018-08-28 DIAGNOSIS — T7611XA Adult physical abuse, suspected, initial encounter: Secondary | ICD-10-CM | POA: Insufficient documentation

## 2018-08-29 ENCOUNTER — Telehealth: Payer: Self-pay

## 2018-08-29 NOTE — Telephone Encounter (Signed)
Copied from [place] [id number]. Topic: General - Other >> Aug 29, 2018  2:09 PM Judyann Munson wrote: Reason for CRM: patient mother Ms. Inez Catalina is calling to advise the patient was seen the ER on 08-23-18, they was prescribe methocarbamol (ROBAXIN) 500 MG tablet   and the insurance will not cover the medication. The patient is in pain and she is requesting something different be called in, related to his broken ribs. Please advise

## 2018-08-29 NOTE — Telephone Encounter (Signed)
Message sent to Dr. Jordan for review and approval. 

## 2018-08-30 ENCOUNTER — Other Ambulatory Visit: Payer: Self-pay | Admitting: Family Medicine

## 2018-08-30 MED ORDER — TIZANIDINE HCL 4 MG PO TABS: 4.0000 mg | ORAL_TABLET | Freq: Three times a day (TID) | ORAL | 0 refills | Status: AC | PRN

## 2018-08-30 NOTE — Telephone Encounter (Signed)
Zanaflex 4 mg to take tid was sent to his pharmacy. Medication causes drowsiness, therefore can increase risk of falls.  Thanks, BJ

## 2018-08-31 NOTE — Telephone Encounter (Signed)
Spoke with Anthony [name], patient's mother and she stated that Anthony [name] lived by hisself and if the medication may have this type of effect then he may need something else. Please advise.

## 2018-09-02 NOTE — Telephone Encounter (Signed)
All muscle relaxants cause similar side effects. So continue nonpharmacologic treatment. He can try OTC Aspercreme with lidocaine or icy hot with lidocaine, local ice, and Tylenol 500 mg 4 times daily as needed for pain.  Thanks, BJ

## 2018-09-05 ENCOUNTER — Emergency Department (HOSPITAL_COMMUNITY): Payer: Medicare Other

## 2018-09-05 ENCOUNTER — Other Ambulatory Visit: Payer: Self-pay

## 2018-09-05 ENCOUNTER — Ambulatory Visit: Payer: Self-pay

## 2018-09-05 ENCOUNTER — Emergency Department (HOSPITAL_COMMUNITY)
Admission: EM | Admit: 2018-09-05 | Discharge: 2018-09-05 | Disposition: A | Discharge status: Home / Self Care | Payer: Medicare Other | Attending: Emergency Medicine | Admitting: Emergency Medicine

## 2018-09-05 ENCOUNTER — Encounter (HOSPITAL_COMMUNITY): Payer: Self-pay | Admitting: Emergency Medicine

## 2018-09-05 DIAGNOSIS — S2241XA Multiple fractures of ribs, right side, initial encounter for closed fracture: Secondary | ICD-10-CM

## 2018-09-05 DIAGNOSIS — Y939 Activity, unspecified: Secondary | ICD-10-CM | POA: Diagnosis not present

## 2018-09-05 DIAGNOSIS — E119 Type 2 diabetes mellitus without complications: Secondary | ICD-10-CM | POA: Diagnosis not present

## 2018-09-05 DIAGNOSIS — Z87891 Personal history of nicotine dependence: Secondary | ICD-10-CM | POA: Diagnosis not present

## 2018-09-05 DIAGNOSIS — S0990XA Unspecified injury of head, initial encounter: Secondary | ICD-10-CM

## 2018-09-05 DIAGNOSIS — Y999 Unspecified external cause status: Secondary | ICD-10-CM | POA: Diagnosis not present

## 2018-09-05 DIAGNOSIS — Y929 Unspecified place or not applicable: Secondary | ICD-10-CM | POA: Insufficient documentation

## 2018-09-05 DIAGNOSIS — I10 Essential (primary) hypertension: Secondary | ICD-10-CM | POA: Insufficient documentation

## 2018-09-05 MED ORDER — IBUPROFEN 800 MG PO TABS: 800.0000 mg | ORAL_TABLET | Freq: Three times a day (TID) | ORAL | 0 refills | Status: DC | PRN

## 2018-09-05 MED ORDER — TRAMADOL HCL 50 MG PO TABS: 50.0000 mg | ORAL_TABLET | Freq: Four times a day (QID) | ORAL | 0 refills | Status: DC | PRN

## 2018-09-05 MED ORDER — LIDOCAINE 5 % EX PTCH: 1.0000 | MEDICATED_PATCH | CUTANEOUS | 0 refills | Status: DC
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
## 2018-09-05 NOTE — Discharge Instructions (Signed)
Your workup today showed that you have a fracture to one or more ribs.  Unfortunately this type of injury hurts but there is no way to fix it immediately; it must heal over time.  Be sure to take plenty of deep breaths so that you get rid of the "bad air" in your lungs.  If you are given a device called an incentive spirometer, please use it as recommended.  Unless you have been told by your doctor not to do so, we recommend you take ibuprofen 600 mg 3 times daily with meals for no more than 5 days.  You can also take Tylenol 1000 mg every 6 hours for pain.  Follow-up at the clinics or with the doctors described in this paperwork.  Return to the emergency department if he develop new or worsening symptoms that concern you.   Rib Fracture A rib fracture is a break or crack in one of the bones of the ribs. The ribs are a group of Masey Scheiber, curved bones that wrap around your chest and attach to your spine. They protect your lungs and other organs in the chest cavity. A broken or cracked rib is often painful, but most do not cause other problems. Most rib fractures heal on their own over time. However, rib fractures can be more serious if multiple ribs are broken or if broken ribs move out of place and push against other structures. CAUSES  A direct blow to the chest. For example, this could happen during contact sports, a car accident, or a fall against a hard object. Repetitive movements with high force, such as pitching a baseball or having severe coughing spells. SYMPTOMS  Pain when you breathe in or cough. Pain when someone presses on the injured area. DIAGNOSIS  Your caregiver will perform a physical exam. Various imaging tests may be ordered to confirm the diagnosis and to look for related injuries. These tests may include a chest X-ray, computed tomography (CT), magnetic resonance imaging (MRI), or a bone scan. TREATMENT  Rib fractures usually heal on their own in 1-3 months. The longer healing  period is often associated with a continued cough or other aggravating activities. During the healing period, pain control is very important. Medication is usually given to control pain. Hospitalization or surgery may be needed for more severe injuries, such as those in which multiple ribs are broken or the ribs have moved out of place.  HOME CARE INSTRUCTIONS  Avoid strenuous activity and any activities or movements that cause pain. Be careful during activities and avoid bumping the injured rib. Gradually increase activity as directed by your caregiver. Only take over-the-counter or prescription medications as directed by your caregiver. Do not take other medications without asking your caregiver first. Apply ice to the injured area for the first 1-2 days after you have been treated or as directed by your caregiver. Applying ice helps to reduce inflammation and pain. Put ice in a plastic bag. Place a towel between your skin and the bag.   Leave the ice on for 15-20 minutes at a time, every 2 hours while you are awake. Perform deep breathing as directed by your caregiver. This will help prevent pneumonia, which is a common complication of a broken rib. Your caregiver may instruct you to: Take deep breaths several times a day. Try to cough several times a day, holding a pillow against the injured area. Use a device called an incentive spirometer to practice deep breathing several times a day. Drink   enough fluids to keep your urine clear or pale yellow. This will help you avoid constipation.   Do not wear a rib belt or binder. These restrict breathing, which can lead to pneumonia.   SEEK IMMEDIATE MEDICAL CARE IF:  You have a fever.   You have difficulty breathing or shortness of breath.   You develop a continual cough, or you cough up thick or bloody sputum. You feel sick to your stomach (nausea), throw up (vomit), or have abdominal pain.   You have worsening pain not controlled with medications.    MAKE SURE YOU: Understand these instructions. Will watch your condition. Will get help right away if you are not doing well or get worse. Document Released: 12/07/2005 Document Revised: 08/09/2013 Document Reviewed: 02/08/2013 Exit[employer] Patient Information 2015 [employer]. This information is not intended to replace advice given to you by your health care provider. Make sure you discuss any questions you have with your health care provider.   

## 2018-09-05 NOTE — Telephone Encounter (Signed)
Spoke with patient and gave instructions. Patient verbalized understanding.
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
## 2018-09-13 ENCOUNTER — Other Ambulatory Visit: Payer: Self-pay

## 2018-09-13 NOTE — Patient Outreach (Signed)
[facility]) Care Management  09/13/2018  Anthony [name] 1961/07/23 [id number]   Medication Adherence call to [name] [place] left a message for patient to call back patient is due on Lisinopril/ Hctz 20/12.5 mg. [name] is showing past due under [place].   [facility] Direct Dial [phone]  Fax [phone] [email or web address]

## 2018-09-20 ENCOUNTER — Emergency Department (HOSPITAL_COMMUNITY)
Admission: EM | Admit: 2018-09-20 | Discharge: 2018-09-20 | Disposition: A | Discharge status: Home / Self Care | Payer: Medicare Other | Attending: Emergency Medicine | Admitting: Emergency Medicine

## 2018-09-20 DIAGNOSIS — Z87891 Personal history of nicotine dependence: Secondary | ICD-10-CM | POA: Insufficient documentation

## 2018-09-20 DIAGNOSIS — Z7984 Long term (current) use of oral hypoglycemic drugs: Secondary | ICD-10-CM | POA: Diagnosis not present

## 2018-09-20 DIAGNOSIS — R000 Tachycardia, unspecified: Secondary | ICD-10-CM | POA: Insufficient documentation

## 2018-09-20 DIAGNOSIS — E119 Type 2 diabetes mellitus without complications: Secondary | ICD-10-CM | POA: Diagnosis not present

## 2018-09-20 DIAGNOSIS — F10920 Alcohol use, unspecified with intoxication, uncomplicated: Secondary | ICD-10-CM | POA: Diagnosis not present

## 2018-09-20 DIAGNOSIS — Z79899 Other long term (current) drug therapy: Secondary | ICD-10-CM | POA: Diagnosis not present

## 2018-09-20 DIAGNOSIS — R079 Chest pain, unspecified: Secondary | ICD-10-CM | POA: Diagnosis present
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
## 2018-09-21 ENCOUNTER — Ambulatory Visit: Payer: Self-pay | Admitting: Family Medicine

## 2018-09-22 ENCOUNTER — Other Ambulatory Visit: Payer: Self-pay | Admitting: Family Medicine

## 2018-09-22 NOTE — Telephone Encounter (Signed)
Copied from [place] [id number]. Topic: Quick Communication - Rx Refill/Question >> Sep 22, 2018  4:30 PM Percell Belt A wrote: Medication: metFORMIN (GLUCOPHAGE) 1000 MG tablet [[id number]]  Has the patient contacted their pharmacy? No  (Agent: If no, request that the patient contact the pharmacy for the refill.) (Agent: If yes, when and what did the pharmacy advise?)  Preferred Pharmacy (with phone number or street name): [facility], [place] [phone] (Phone)  -   Agent: Please be advised that RX refills may take up to 3 business days. We ask that you follow-up with your pharmacy.

## 2018-09-22 NOTE — Telephone Encounter (Signed)
Requested medication (s) are due for refill today: Yes  Requested medication (s) are on the active medication list: Yes  Last refill:  09/10/17  Future visit scheduled: Yes  Notes to clinic:  Expired prescription, unable to refill     Requested Prescriptions  Pending Prescriptions Disp Refills   metFORMIN (GLUCOPHAGE) 1000 MG tablet 180 tablet 3     Endocrinology:  Diabetes - Biguanides Passed - 09/22/2018  5:22 PM      Passed - Cr in normal range and within 360 days    Creatinine, Ser  Date Value Ref Range Status  08/17/2018 0.81 0.40 - 1.50 mg/dL Final         Passed - HBA1C is between 0 and 7.9 and within 180 days    Hemoglobin A1C  Date Value Ref Range Status  08/02/2018 7.1 (A) 4.0 - 5.6 % Final   Hgb A1c MFr Bld  Date Value Ref Range Status  10/07/2017 4.8 4.6 - 6.5 % Final    Comment:    Glycemic Control Guidelines for People with Diabetes:Non Diabetic:  <6%Goal of Therapy: <7%Additional Action Suggested:  >8%          Passed - eGFR in normal range and within 360 days    GFR calc Af Amer  Date Value Ref Range Status  08/18/2016 >60 >60 mL/min Final    Comment:    (NOTE) The eGFR has been calculated using the CKD EPI equation. This calculation has not been validated in all clinical situations. eGFR's persistently <60 mL/min signify possible Chronic Kidney Disease.    GFR calc non Af Amer  Date Value Ref Range Status  08/18/2016 >60 >60 mL/min Final   GFR  Date Value Ref Range Status  08/17/2018 104.78 >60.00 mL/min Final         Passed - Valid encounter within last 6 months    Recent Outpatient Visits          3 weeks ago Essential hypertension   Therapist, music at [facility] [place], Malka So, MD   1 month ago Essential hypertension   [facility] at [facility], Doretha Sou, MD   1 month ago Diabetes mellitus without complication [facility])   [facility] [employer], Doretha Sou, MD   4 months ago Essential  hypertension   Therapist, music at [employer], Doretha Sou, MD   5 months ago Essential hypertension   Therapist, music at [employer], Doretha Sou, MD      Future Appointments            In 3 weeks [place], Malka So, MD [employer] at [place], [place]   In 2 months [place], Malka So, MD [employer] at [place], [facility]

## 2018-09-22 NOTE — Addendum Note (Signed)
Addended by: Matilde Sprang on: 09/22/2018 05:22 PM   Modules accepted: Orders

## 2018-09-23 MED ORDER — METFORMIN HCL 1000 MG PO TABS: 1000.0000 mg | ORAL_TABLET | Freq: Two times a day (BID) | ORAL | 1 refills | Status: DC

## 2018-09-28 ENCOUNTER — Emergency Department (HOSPITAL_COMMUNITY)
Admission: EM | Admit: 2018-09-28 | Discharge: 2018-09-28 | Discharge status: Against Medical Advice | Payer: Medicare Other | Source: Home / Self Care

## 2018-09-28 ENCOUNTER — Emergency Department (HOSPITAL_COMMUNITY)
Admission: EM | Admit: 2018-09-28 | Discharge: 2018-09-28 | Disposition: A | Discharge status: Home / Self Care | Payer: Medicare Other | Attending: Emergency Medicine | Admitting: Emergency Medicine

## 2018-09-28 ENCOUNTER — Other Ambulatory Visit: Payer: Self-pay

## 2018-09-28 ENCOUNTER — Encounter (HOSPITAL_COMMUNITY): Payer: Self-pay | Admitting: Emergency Medicine

## 2018-09-28 DIAGNOSIS — I10 Essential (primary) hypertension: Secondary | ICD-10-CM | POA: Insufficient documentation

## 2018-09-28 DIAGNOSIS — R0789 Other chest pain: Secondary | ICD-10-CM | POA: Insufficient documentation

## 2018-09-28 DIAGNOSIS — F10129 Alcohol abuse with intoxication, unspecified: Secondary | ICD-10-CM | POA: Diagnosis present

## 2018-09-28 DIAGNOSIS — Z7984 Long term (current) use of oral hypoglycemic drugs: Secondary | ICD-10-CM | POA: Diagnosis not present

## 2018-09-28 DIAGNOSIS — Z87891 Personal history of nicotine dependence: Secondary | ICD-10-CM | POA: Insufficient documentation

## 2018-09-28 DIAGNOSIS — Z79899 Other long term (current) drug therapy: Secondary | ICD-10-CM | POA: Diagnosis not present

## 2018-09-28 DIAGNOSIS — F1010 Alcohol abuse, uncomplicated: Secondary | ICD-10-CM | POA: Diagnosis not present

## 2018-09-28 DIAGNOSIS — E119 Type 2 diabetes mellitus without complications: Secondary | ICD-10-CM | POA: Insufficient documentation

## 2018-09-28 MED ORDER — ACETAMINOPHEN 325 MG PO TABS
650.0000 mg | ORAL_TABLET | Freq: Once | ORAL | Status: AC
  Administered 2018-09-28: 650 mg via ORAL
  Filled 2018-09-28: qty 2

## 2018-09-28 NOTE — ED Notes (Signed)
Pt given cup of water 
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
## 2018-09-28 NOTE — ED Notes (Signed)
Pt requesting something to drink, eat and tylenol; Dr. Tomi Bamberger informed and acetaminophen ordered; pt given cup of water

## 2018-09-28 NOTE — Discharge Instructions (Addendum)
You need to quit drinking so much. Follow up with your doctor as needed.
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
## 2018-10-03 ENCOUNTER — Other Ambulatory Visit: Payer: Self-pay | Admitting: Internal Medicine

## 2018-10-03 ENCOUNTER — Encounter: Payer: Self-pay | Admitting: Internal Medicine

## 2018-10-03 NOTE — Telephone Encounter (Signed)
Not sure if anything changed with pt Rx. Rx routed to me

## 2018-10-05 ENCOUNTER — Ambulatory Visit: Payer: Medicare Other | Admitting: *Deleted

## 2018-10-05 ENCOUNTER — Encounter: Payer: Self-pay | Admitting: Family Medicine

## 2018-10-05 ENCOUNTER — Other Ambulatory Visit (INDEPENDENT_AMBULATORY_CARE_PROVIDER_SITE_OTHER): Payer: Medicare Other

## 2018-10-05 ENCOUNTER — Ambulatory Visit: Payer: Self-pay

## 2018-10-05 DIAGNOSIS — E538 Deficiency of other specified B group vitamins: Secondary | ICD-10-CM | POA: Diagnosis not present

## 2018-10-05 LAB — VITAMIN B12: VITAMIN B 12: 620 pg/mL (ref 211–911)

## 2018-10-05 NOTE — Progress Notes (Unsigned)
Patient here for B12 injection After reviewing the chart, the patient has missed several B12 injections. Last Injection was 05/2018 with previous monthly injections in May and April 2019. Pt also has not had B12 levels checked since 2017.  Spoke with Dr Martinique and she stated that since there has been a 4 month break in therapy and also pt has not had levels checked since 2017 that we are not going to do the injection today. Pt was sent to the lab to have Vit B12 labs drawn and explained to the  patient and family member present that since there was a break in therapy and we do not have recent levels that we are just drawing the labs today and will discuss once labs come back if the injections will be resumed and further treatment. Pt expressed understanding as well as family member present.   Varney Daily, CMA

## 2018-10-05 NOTE — Progress Notes (Signed)
Patient here for B12 injection After reviewing the chart, the patient has missed several B12 injections. Last Injection was 05/2018 with previous monthly injections in May and April 2019. Pt also has not had B12 levels checked since 2017.  Spoke with Dr Martinique and she stated that since there has been a 4 month break in therapy and also pt has not had levels checked since 2017 that we are not going to do the injection today. Pt was sent to the lab to have Vit B12 labs drawn and explained to the  patient and family member present that since there was a break in therapy and we do not have recent levels that we are just drawing the labs today and will discuss once labs come back if the injections will be resumed and further treatment. Pt expressed understanding as well as family member present.   Varney Daily, CMA

## 2018-10-08 ENCOUNTER — Emergency Department (HOSPITAL_COMMUNITY)
Admission: EM | Admit: 2018-10-08 | Discharge: 2018-10-08 | Disposition: A | Discharge status: Home / Self Care | Payer: Medicare Other | Attending: Emergency Medicine | Admitting: Emergency Medicine

## 2018-10-08 ENCOUNTER — Other Ambulatory Visit: Payer: Self-pay

## 2018-10-08 ENCOUNTER — Encounter (HOSPITAL_COMMUNITY): Payer: Self-pay

## 2018-10-08 DIAGNOSIS — F99 Mental disorder, not otherwise specified: Secondary | ICD-10-CM | POA: Insufficient documentation

## 2018-10-08 DIAGNOSIS — Z5321 Procedure and treatment not carried out due to patient leaving prior to being seen by health care provider: Secondary | ICD-10-CM | POA: Diagnosis not present

## 2018-10-08 NOTE — ED Notes (Signed)
C-collar in place

## 2018-10-08 NOTE — ED Notes (Signed)
Patient took neck brace off and left without notifying staff.
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
## 2018-10-08 NOTE — ED Notes (Signed)
Pt informed EDP will be with him shortly. Pt states "if they don't hurry up I'm going to take this neck brace off and leave." Informed pt to keep brace in place and they would be with him as soon as they could.

## 2018-10-10 ENCOUNTER — Emergency Department (HOSPITAL_COMMUNITY)
Admission: EM | Admit: 2018-10-10 | Discharge: 2018-10-10 | Discharge status: Against Medical Advice | Payer: Medicare Other

## 2018-10-10 LAB — HM DIABETES EYE EXAM

## 2018-10-10 NOTE — ED Triage Notes (Signed)
Called for triage x 3 not available

## 2018-10-11 ENCOUNTER — Other Ambulatory Visit: Payer: Self-pay

## 2018-10-11 NOTE — Patient Outreach (Signed)
[facility]) Care Management  10/11/2018  Anthony [name] 1961-11-16 [id number]    Telephone Screen Referral Date : 10/10/2018 Referral Source: [facility] ED Census Referral Reason: ED Utilization Insurance: [employer] attempt # 1 To patient for screening. No answer.  Message stated the services is unallocated.    Plan: RN Health Coach will send letter.  RN Health Coach will make an outreach attempt to the patient within four business days.  Lazaro Arms RN, BSN, [place] Direct Dial:  [phone] Fax: [phone]

## 2018-10-14 ENCOUNTER — Emergency Department (HOSPITAL_COMMUNITY)
Admission: EM | Admit: 2018-10-14 | Discharge: 2018-10-14 | Disposition: A | Discharge status: Home / Self Care | Payer: Medicare Other | Attending: Emergency Medicine | Admitting: Emergency Medicine

## 2018-10-14 ENCOUNTER — Encounter: Payer: Self-pay | Admitting: *Deleted

## 2018-10-14 ENCOUNTER — Encounter: Payer: Self-pay | Admitting: Family Medicine

## 2018-10-14 ENCOUNTER — Encounter (HOSPITAL_COMMUNITY): Payer: Self-pay | Admitting: Emergency Medicine

## 2018-10-14 ENCOUNTER — Other Ambulatory Visit: Payer: Self-pay

## 2018-10-14 ENCOUNTER — Ambulatory Visit (INDEPENDENT_AMBULATORY_CARE_PROVIDER_SITE_OTHER): Payer: Medicare Other | Admitting: Family Medicine

## 2018-10-14 VITALS — BP 140/90 | HR 77 | Temp 98.6°F | Resp 12 | Ht 68.0 in | Wt 185.4 lb

## 2018-10-14 DIAGNOSIS — I10 Essential (primary) hypertension: Secondary | ICD-10-CM | POA: Insufficient documentation

## 2018-10-14 DIAGNOSIS — F1020 Alcohol dependence, uncomplicated: Secondary | ICD-10-CM | POA: Insufficient documentation

## 2018-10-14 DIAGNOSIS — E119 Type 2 diabetes mellitus without complications: Secondary | ICD-10-CM | POA: Diagnosis not present

## 2018-10-14 DIAGNOSIS — Z79899 Other long term (current) drug therapy: Secondary | ICD-10-CM | POA: Insufficient documentation

## 2018-10-14 DIAGNOSIS — F329 Major depressive disorder, single episode, unspecified: Secondary | ICD-10-CM | POA: Diagnosis not present

## 2018-10-14 DIAGNOSIS — Z23 Encounter for immunization: Secondary | ICD-10-CM

## 2018-10-14 DIAGNOSIS — Z87891 Personal history of nicotine dependence: Secondary | ICD-10-CM | POA: Insufficient documentation

## 2018-10-14 DIAGNOSIS — Z0000 Encounter for general adult medical examination without abnormal findings: Secondary | ICD-10-CM

## 2018-10-14 DIAGNOSIS — F1010 Alcohol abuse, uncomplicated: Secondary | ICD-10-CM

## 2018-10-14 DIAGNOSIS — E7800 Pure hypercholesterolemia, unspecified: Secondary | ICD-10-CM | POA: Diagnosis not present

## 2018-10-14 DIAGNOSIS — Z7984 Long term (current) use of oral hypoglycemic drugs: Secondary | ICD-10-CM | POA: Insufficient documentation

## 2018-10-14 DIAGNOSIS — R740 Nonspecific elevation of levels of transaminase and lactic acid dehydrogenase [LDH]: Secondary | ICD-10-CM

## 2018-10-14 DIAGNOSIS — Z125 Encounter for screening for malignant neoplasm of prostate: Secondary | ICD-10-CM | POA: Diagnosis not present

## 2018-10-14 DIAGNOSIS — R7401 Elevation of levels of liver transaminase levels: Secondary | ICD-10-CM | POA: Insufficient documentation

## 2018-10-14 LAB — CBC WITH DIFFERENTIAL/PLATELET
ABS IMMATURE GRANULOCYTES: 0.08 10*3/uL — AB (ref 0.00–0.07)
Basophils Absolute: 0.1 10*3/uL (ref 0.0–0.1)
Basophils Relative: 1 %
EOS PCT: 0 %
Eosinophils Absolute: 0 10*3/uL (ref 0.0–0.5)
HEMATOCRIT: 35.4 % — AB (ref 39.0–52.0)
HEMOGLOBIN: 11.6 g/dL — AB (ref 13.0–17.0)
Immature Granulocytes: 1 %
LYMPHS ABS: 2.1 10*3/uL (ref 0.7–4.0)
LYMPHS PCT: 21 %
MCH: 31.8 pg (ref 26.0–34.0)
MCHC: 32.8 g/dL (ref 30.0–36.0)
MCV: 97 fL (ref 80.0–100.0)
MONO ABS: 0.8 10*3/uL (ref 0.1–1.0)
MONOS PCT: 9 %
NEUTROS ABS: 6.6 10*3/uL (ref 1.7–7.7)
Neutrophils Relative %: 68 %
PLATELETS: 221 10*3/uL (ref 150–400)
RBC: 3.65 MIL/uL — ABNORMAL LOW (ref 4.22–5.81)
RDW: 13.7 % (ref 11.5–15.5)
WBC: 9.6 10*3/uL (ref 4.0–10.5)
nRBC: 0 % (ref 0.0–0.2)

## 2018-10-14 LAB — HEPATIC FUNCTION PANEL
ALBUMIN: 4.5 g/dL (ref 3.5–5.2)
ALK PHOS: 92 U/L (ref 39–117)
ALT: 26 U/L (ref 0–53)
AST: 29 U/L (ref 0–37)
BILIRUBIN DIRECT: 0.1 mg/dL (ref 0.0–0.3)
Total Bilirubin: 0.6 mg/dL (ref 0.2–1.2)
Total Protein: 7.9 g/dL (ref 6.0–8.3)

## 2018-10-14 LAB — BASIC METABOLIC PANEL
Anion gap: 11 (ref 5–15)
BUN: 18 mg/dL (ref 6–20)
CALCIUM: 8.8 mg/dL — AB (ref 8.9–10.3)
CHLORIDE: 102 mmol/L (ref 98–111)
CO2: 22 mmol/L (ref 22–32)
Creatinine, Ser: 0.93 mg/dL (ref 0.61–1.24)
GFR calc non Af Amer: >60 mL/min (ref >60)
Glucose, Bld: 230 mg/dL — ABNORMAL HIGH (ref 70–99)
Potassium: 3.5 mmol/L (ref 3.5–5.1)
SODIUM: 135 mmol/L (ref 135–145)

## 2018-10-14 LAB — RAPID URINE DRUG SCREEN, HOSP PERFORMED
Amphetamines: NOT DETECTED
Barbiturates: NOT DETECTED
Benzodiazepines: NOT DETECTED
COCAINE: NOT DETECTED
OPIATES: NOT DETECTED
TETRAHYDROCANNABINOL: NOT DETECTED

## 2018-10-14 LAB — LIPID PANEL
CHOL/HDL RATIO: 3
Cholesterol: 223 mg/dL — ABNORMAL HIGH (ref 0–200)
HDL: 81.8 mg/dL (ref >39.00)
LDL Cholesterol: 107 mg/dL — ABNORMAL HIGH (ref 0–99)
NONHDL: 141.52
TRIGLYCERIDES: 175 mg/dL — AB (ref 0.0–149.0)
VLDL: 35 mg/dL (ref 0.0–40.0)

## 2018-10-14 LAB — PSA: PSA: 0.58 ng/mL (ref 0.10–4.00)

## 2018-10-14 LAB — ETHANOL: Alcohol, Ethyl (B): <10 mg/dL (ref <10)

## 2018-10-14 MED ORDER — LORAZEPAM 1 MG PO TABS
0.0000 mg | ORAL_TABLET | Freq: Four times a day (QID) | ORAL | Status: DC
  Administered 2018-10-14: 4 mg via ORAL
  Filled 2018-10-14: qty 4

## 2018-10-14 MED ORDER — GLIPIZIDE ER 5 MG PO TB24
5.0000 mg | ORAL_TABLET | Freq: Every day | ORAL | Status: DC
  Filled 2018-10-14 (×3): qty 1

## 2018-10-14 MED ORDER — METFORMIN HCL 500 MG PO TABS: 1000.0000 mg | ORAL_TABLET | Freq: Two times a day (BID) | ORAL | Status: DC

## 2018-10-14 MED ORDER — LISINOPRIL 10 MG PO TABS
10.0000 mg | ORAL_TABLET | Freq: Every day | ORAL | Status: DC
  Administered 2018-10-14: 10 mg via ORAL
  Filled 2018-10-14: qty 1

## 2018-10-14 MED ORDER — CANAGLIFLOZIN 300 MG PO TABS
300.0000 mg | ORAL_TABLET | Freq: Every day | ORAL | Status: DC
  Filled 2018-10-14 (×3): qty 1

## 2018-10-14 MED ORDER — ATORVASTATIN CALCIUM 40 MG PO TABS
40.0000 mg | ORAL_TABLET | Freq: Every day | ORAL | Status: DC
  Administered 2018-10-14: 40 mg via ORAL
  Filled 2018-10-14: qty 1

## 2018-10-14 MED ORDER — CITALOPRAM HYDROBROMIDE 20 MG PO TABS
20.0000 mg | ORAL_TABLET | Freq: Every day | ORAL | Status: DC
  Filled 2018-10-14 (×3): qty 1

## 2018-10-14 MED ORDER — VITAMIN B-1 100 MG PO TABS
100.0000 mg | ORAL_TABLET | Freq: Every day | ORAL | Status: DC
  Administered 2018-10-14: 100 mg via ORAL
  Filled 2018-10-14: qty 1

## 2018-10-14 MED ORDER — GABAPENTIN 300 MG PO CAPS
300.0000 mg | ORAL_CAPSULE | Freq: Two times a day (BID) | ORAL | Status: DC
  Administered 2018-10-14: 300 mg via ORAL
  Filled 2018-10-14: qty 1

## 2018-10-14 MED ORDER — LORAZEPAM 2 MG/ML IJ SOLN: 0.0000 mg | Freq: Four times a day (QID) | INTRAMUSCULAR | Status: DC

## 2018-10-14 MED ORDER — LORAZEPAM 2 MG/ML IJ SOLN: 0.0000 mg | Freq: Two times a day (BID) | INTRAMUSCULAR | Status: DC

## 2018-10-14 MED ORDER — LORAZEPAM 1 MG PO TABS: 0.0000 mg | ORAL_TABLET | Freq: Two times a day (BID) | ORAL | Status: DC

## 2018-10-14 MED ORDER — TOPIRAMATE 25 MG PO TABS
50.0000 mg | ORAL_TABLET | Freq: Two times a day (BID) | ORAL | Status: DC
  Administered 2018-10-14: 50 mg via ORAL
  Filled 2018-10-14: qty 2

## 2018-10-14 MED ORDER — THIAMINE HCL 100 MG/ML IJ SOLN: 100.0000 mg | Freq: Every day | INTRAMUSCULAR | Status: DC

## 2018-10-14 NOTE — ED Triage Notes (Signed)
Pt comes into ED wanting to detox from alcohol.  Pt states has not had alcohol in 24 hours.  Pt states he drinks two forties a day.  Pt denies SI/HI

## 2018-10-14 NOTE — Discharge Instructions (Addendum)
Per your Behavioral Health evaluation this evening, it was determined that you do not meet criteria to be admitted for your alcohol use disorder, but you are strongly encouraged to follow up at an outpatient program for assistance with quitting.  I have provided a resource guide for this - you may also be wise to call the phone # on the back of your insurance card who can also help you obtain outpatient treatment (guiding you to resources your insurance will cover).
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
## 2018-10-14 NOTE — ED Notes (Signed)
Reminded patient that urine specimen is still needed.
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
## 2018-10-14 NOTE — Patient Instructions (Addendum)
A few things to remember from today's visit:   Essential hypertension  Pure hypercholesterolemia  Diabetes mellitus without complication (HCC)  Elevated transaminase level  A few tips:  -As we age balance is not as good as it was, so there is a higher risks for falls. Please remove small rugs and furniture that is "in your way" and could increase the risk of falls. Stretching exercises may help with fall prevention: Yoga and Tai Chi are some examples. Low impact exercise is better, so you are not very achy the next day.  -Sun screen and avoidance of direct sun light recommended. Caution with dehydration, if working outdoors be sure to drink enough fluids.  - Some medications are not safe as we age, increases the risk of side effects and can potentially interact with other medication you are also taken;  including some of over the counter medications. Be sure to let me know when you start a new medication even if it is a dietary/vitamin supplement.   -Healthy diet low in red meet/animal fat and sugar + regular physical activity is recommended.       Screening schedule for the next 5-10 years:  Colonoscopy is due.  Glaucoma screening/eye exam every year.  Flu vaccine annually.  Diabetes and cholesterol screening   Fall prevention   Abdominal aortic aneurysm screening if you ever smoke.    Advance directives:  Please see a lawyer and/or go to this website to help you with advanced directives and designating a health care power of attorney so that your wishes will be followed should you become too ill to make your own medical decisions.  http://www.ncdhhs.gov/aging/direct.htm   Please be sure medication list is accurate. If a new problem present, please set up appointment sooner than planned today.        

## 2018-10-14 NOTE — ED Provider Notes (Signed)
Call from [facility] indicating pt does not meet inpatient criteria for etoh abuse, recommended for outpatient tx.  He was given resources.  He denies h/o withdrawal/ hallucinations, reports recently remained sober x 5 months without assistance.  Advised recheck here/or by pcp for any worsened or new sx.  Stable for dc.   Evalee Jefferson, PA-C 10/14/18 2227    Julianne Rice, MD 10/16/18 [phone]

## 2018-10-14 NOTE — ED Notes (Signed)
[place] call RN and informed staff Pt does not meet in patient criteria and should follow-up with out patient resources.

## 2018-10-14 NOTE — ED Notes (Signed)
Patient changed into paper scrubs and wanded by Security.

## 2018-10-15 ENCOUNTER — Emergency Department (HOSPITAL_COMMUNITY): Payer: Medicare Other

## 2018-10-15 ENCOUNTER — Encounter (HOSPITAL_COMMUNITY): Payer: Self-pay | Admitting: Emergency Medicine

## 2018-10-15 ENCOUNTER — Emergency Department (HOSPITAL_COMMUNITY)
Admission: EM | Admit: 2018-10-15 | Discharge: 2018-10-15 | Disposition: A | Discharge status: Home / Self Care | Payer: Medicare Other | Attending: Emergency Medicine | Admitting: Emergency Medicine

## 2018-10-15 DIAGNOSIS — M545 Low back pain, unspecified: Secondary | ICD-10-CM

## 2018-10-15 DIAGNOSIS — Z590 Homelessness: Secondary | ICD-10-CM | POA: Diagnosis not present

## 2018-10-15 DIAGNOSIS — I10 Essential (primary) hypertension: Secondary | ICD-10-CM | POA: Insufficient documentation

## 2018-10-15 DIAGNOSIS — Z79899 Other long term (current) drug therapy: Secondary | ICD-10-CM | POA: Insufficient documentation

## 2018-10-15 DIAGNOSIS — Z87891 Personal history of nicotine dependence: Secondary | ICD-10-CM | POA: Insufficient documentation

## 2018-10-15 DIAGNOSIS — E119 Type 2 diabetes mellitus without complications: Secondary | ICD-10-CM | POA: Diagnosis not present

## 2018-10-15 DIAGNOSIS — Z7984 Long term (current) use of oral hypoglycemic drugs: Secondary | ICD-10-CM | POA: Diagnosis not present

## 2018-10-15 MED ORDER — DICLOFENAC SODIUM 50 MG PO TBEC: 50.0000 mg | DELAYED_RELEASE_TABLET | Freq: Two times a day (BID) | ORAL | 0 refills | Status: DC

## 2018-10-15 MED ORDER — KETOROLAC TROMETHAMINE 60 MG/2ML IM SOLN
60.0000 mg | Freq: Once | INTRAMUSCULAR | Status: AC
  Administered 2018-10-15: 60 mg via INTRAMUSCULAR
  Filled 2018-10-15: qty 2

## 2018-10-15 NOTE — Discharge Instructions (Addendum)
Prescription for anti-inflammatory pain medicine.  Ice pack.  Follow-up with your primary care doctor.
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
## 2018-10-15 NOTE — ED Notes (Signed)
Patient pacing room upon entrance. Pt loudly yelling "I want some real food, I don't want this shit" repeatedly.

## 2018-10-16 ENCOUNTER — Emergency Department (HOSPITAL_COMMUNITY)
Admission: EM | Admit: 2018-10-16 | Discharge: 2018-10-16 | Disposition: A | Discharge status: Home / Self Care | Payer: Medicare Other | Attending: Emergency Medicine | Admitting: Emergency Medicine

## 2018-10-16 ENCOUNTER — Encounter (HOSPITAL_COMMUNITY): Payer: Self-pay | Admitting: Emergency Medicine

## 2018-10-16 ENCOUNTER — Other Ambulatory Visit: Payer: Self-pay

## 2018-10-16 ENCOUNTER — Encounter: Payer: Self-pay | Admitting: Family Medicine

## 2018-10-16 DIAGNOSIS — Z5321 Procedure and treatment not carried out due to patient leaving prior to being seen by health care provider: Secondary | ICD-10-CM | POA: Diagnosis not present

## 2018-10-16 DIAGNOSIS — M545 Low back pain: Secondary | ICD-10-CM | POA: Diagnosis not present

## 2018-10-16 NOTE — ED Triage Notes (Signed)
PT c/o continued lower back pain x1 day. PT was brought in by [facility]. PT states last alcohol intake was last night after he got discharged from the ED.

## 2018-10-16 NOTE — ED Notes (Signed)
Pt called for room placement, not in waiting area.

## 2018-10-16 NOTE — ED Notes (Signed)
Pt not in waiting area 

## 2018-10-16 NOTE — ED Notes (Signed)
Pt no longer in waiting area.  
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
## 2018-10-17 ENCOUNTER — Emergency Department (HOSPITAL_COMMUNITY)
Admission: EM | Admit: 2018-10-17 | Discharge: 2018-10-17 | Disposition: A | Discharge status: Home / Self Care | Payer: Medicare Other

## 2018-10-17 ENCOUNTER — Encounter (HOSPITAL_COMMUNITY): Payer: Self-pay | Admitting: Emergency Medicine

## 2018-10-17 ENCOUNTER — Other Ambulatory Visit: Payer: Self-pay

## 2018-10-17 DIAGNOSIS — M546 Pain in thoracic spine: Secondary | ICD-10-CM | POA: Diagnosis present

## 2018-10-17 DIAGNOSIS — Z79899 Other long term (current) drug therapy: Secondary | ICD-10-CM | POA: Diagnosis not present

## 2018-10-17 DIAGNOSIS — F1010 Alcohol abuse, uncomplicated: Secondary | ICD-10-CM | POA: Insufficient documentation

## 2018-10-17 DIAGNOSIS — Z87891 Personal history of nicotine dependence: Secondary | ICD-10-CM | POA: Insufficient documentation

## 2018-10-17 DIAGNOSIS — M545 Low back pain: Secondary | ICD-10-CM | POA: Diagnosis not present

## 2018-10-17 DIAGNOSIS — Z7984 Long term (current) use of oral hypoglycemic drugs: Secondary | ICD-10-CM | POA: Diagnosis not present

## 2018-10-17 DIAGNOSIS — I10 Essential (primary) hypertension: Secondary | ICD-10-CM | POA: Insufficient documentation

## 2018-10-17 DIAGNOSIS — E119 Type 2 diabetes mellitus without complications: Secondary | ICD-10-CM | POA: Insufficient documentation

## 2018-10-17 NOTE — ED Triage Notes (Signed)
Pt c/o mid lower back pain

## 2018-10-17 NOTE — Patient Outreach (Signed)
[facility]) Care Management  10/17/2018  Anthony [name] Jan 26, 1961 [id number]     Telephone Screen Referral Date : 10/10/2018 Referral Source: [facility] ED Census Referral Reason: ED Utilization Insurance: [place] attempt # 2 to the patient for screening.  [facility] answered the phone and stated that the patient was not there at this time. HIPAA complaint voicemail left with contact information.  Plan:  RN Health Coach will make another outreach attempt to the patient within four business days.  Lazaro Arms RN, BSN, [place] Direct Dial:  [phone]  Fax: [phone]

## 2018-10-18 ENCOUNTER — Other Ambulatory Visit: Payer: Self-pay

## 2018-10-18 ENCOUNTER — Emergency Department (HOSPITAL_COMMUNITY): Payer: Medicare Other

## 2018-10-18 ENCOUNTER — Emergency Department (HOSPITAL_COMMUNITY)
Admission: EM | Admit: 2018-10-18 | Discharge: 2018-10-18 | Disposition: A | Discharge status: Home / Self Care | Payer: Medicare Other | Attending: Emergency Medicine | Admitting: Emergency Medicine

## 2018-10-18 ENCOUNTER — Encounter (HOSPITAL_COMMUNITY): Payer: Self-pay

## 2018-10-18 ENCOUNTER — Emergency Department (HOSPITAL_COMMUNITY)
Admission: EM | Admit: 2018-10-18 | Discharge: 2018-10-18 | Disposition: A | Discharge status: Home / Self Care | Payer: Medicare Other | Source: Home / Self Care | Attending: Emergency Medicine | Admitting: Emergency Medicine

## 2018-10-18 DIAGNOSIS — I10 Essential (primary) hypertension: Secondary | ICD-10-CM | POA: Insufficient documentation

## 2018-10-18 DIAGNOSIS — Z7984 Long term (current) use of oral hypoglycemic drugs: Secondary | ICD-10-CM

## 2018-10-18 DIAGNOSIS — F10920 Alcohol use, unspecified with intoxication, uncomplicated: Secondary | ICD-10-CM

## 2018-10-18 DIAGNOSIS — M545 Low back pain: Secondary | ICD-10-CM

## 2018-10-18 DIAGNOSIS — Z87891 Personal history of nicotine dependence: Secondary | ICD-10-CM | POA: Insufficient documentation

## 2018-10-18 DIAGNOSIS — E119 Type 2 diabetes mellitus without complications: Secondary | ICD-10-CM

## 2018-10-18 DIAGNOSIS — Z79899 Other long term (current) drug therapy: Secondary | ICD-10-CM

## 2018-10-18 DIAGNOSIS — F1010 Alcohol abuse, uncomplicated: Secondary | ICD-10-CM

## 2018-10-18 DIAGNOSIS — G8929 Other chronic pain: Secondary | ICD-10-CM

## 2018-10-18 LAB — URINALYSIS, ROUTINE W REFLEX MICROSCOPIC
BILIRUBIN URINE: NEGATIVE
Glucose, UA: NEGATIVE mg/dL
HGB URINE DIPSTICK: NEGATIVE
KETONES UR: NEGATIVE mg/dL
Leukocytes, UA: NEGATIVE
Nitrite: NEGATIVE
PROTEIN: NEGATIVE mg/dL
Specific Gravity, Urine: 1.014 (ref 1.005–1.030)
pH: 5 (ref 5.0–8.0)

## 2018-10-18 LAB — CBG MONITORING, ED: GLUCOSE-CAPILLARY: 175 mg/dL — AB (ref 70–99)

## 2018-10-18 MED ORDER — LORAZEPAM 2 MG/ML IJ SOLN: 0.0000 mg | Freq: Four times a day (QID) | INTRAMUSCULAR | Status: DC

## 2018-10-18 MED ORDER — LORAZEPAM 1 MG PO TABS: 0.0000 mg | ORAL_TABLET | Freq: Two times a day (BID) | ORAL | Status: DC

## 2018-10-18 MED ORDER — LORAZEPAM 2 MG/ML IJ SOLN: 0.0000 mg | Freq: Two times a day (BID) | INTRAMUSCULAR | Status: DC

## 2018-10-18 MED ORDER — ACETAMINOPHEN 325 MG PO TABS
650.0000 mg | ORAL_TABLET | Freq: Once | ORAL | Status: AC
  Administered 2018-10-18: 650 mg via ORAL
  Filled 2018-10-18: qty 2

## 2018-10-18 MED ORDER — LORAZEPAM 1 MG PO TABS: 0.0000 mg | ORAL_TABLET | Freq: Four times a day (QID) | ORAL | Status: DC

## 2018-10-18 MED ORDER — VITAMIN B-1 100 MG PO TABS
100.0000 mg | ORAL_TABLET | Freq: Every day | ORAL | Status: DC
  Administered 2018-10-18: 100 mg via ORAL
  Filled 2018-10-18: qty 1

## 2018-10-18 MED ORDER — THIAMINE HCL 100 MG/ML IJ SOLN: 100.0000 mg | Freq: Every day | INTRAMUSCULAR | Status: DC
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
## 2018-10-18 NOTE — Discharge Instructions (Signed)
Take medications as prescribed previously.

## 2018-10-18 NOTE — Discharge Instructions (Signed)
Substance Abuse Treatment Programs ° °Intensive Outpatient Programs °[facility]     °[address]      °[place]                   °[phone]      ° °[facility] °[address] °[place], [place] °[phone] ° °[facility]     °(Inpatient and outpatient)     °[address]           °[phone]   ° °[facility] °[phone] (Suboxone and Methadone) ° °[address]      °[place], [place] [address]      °[phone]      ° °[address] °[place], [place] °[phone] ° °[facility] (Outpatient/Inpatient, [facility])    °(insurance only) [phone]      °       °[facility] (Groups & Residential) °[place], [place] °[phone] ° °   °[facility]     °[address]     °[place], [place]      °[phone]      ° °[facility] (for caregivers and family) °[address] °[place], [place] °[phone] ° ° ° ° ° °Residential Treatment Programs °[facility]      °[address], [place], [place] [address]  °[phone]      ° °[facility] °[address] [place], [place] [address] °[phone] ° °[facility]        °[phone]      ° °[facility] °[phone] ° °[facility]             °[address]                                         °[place], [place]                                                °[phone] or [phone]                              ° °[facility] [place] °[address] °[place], [address] °[phone] ° °[facility]    °[address]      °[place], [place]     °[phone]      ° °[facility] °[address] °, [place] °[phone] ° °[facility]   °[address]     °[place], [place] [address]     °[phone]      °Admissions: 8am-3pm M-F ° °[facility] °[address] °,  [place] °[phone] ° °[facility]: Residential Program (90 Days)   °[place], [place]      °[phone] or [phone]    ° °[facility] °[place], [place] °(Walk in Hours over the weekend or by referral) ° °[facility] [facility] °[address], [place],  [address] °[phone] ° °Crisis Mobile: [facility]:  1-877-626-1772 (for crisis response 24 hours a day) °[facility] Hotline:      1-800-256-2452 °Outpatient Psychiatry and Counseling ° °[facility]: Mobile Crisis   Management 24 hours:  1-579-867-5796  [facility] of the [employer] sliding scale fee and walk in schedule: M-F 8am-12pm/1pm-3pm [address]  [place], [place] [address] [place], [place] [address] [phone]  [facility] ([facility])- new patient walk-in appointments available Monday - Friday 8am -3pm.          [address] [place], [place] [address] [phone] or crisis line- [facility]/ Intensive Outpatient Therapy Program [place], [place] [address] [place]      [phone] [place], [place] [address]                 [place]   [facility] [phone] [place], [place] [address]   [facility]          [address] Johnette Abraham  [place], [place] [address]       [phone]  [facility] [address] [place], [place] [address] [phone]  [facility]    [address] [place], [place] [address]     [place], [place] Joycelyn Man     [place] [address]     [phone]       [facility] [place] [address]  [facility]     [address], [place], [place], [place] [address] [place]     [address]     [place], [place] [address]     [phone]       [facility] [address] [place], [place] [address] [phone] Resources for Temporary Residential Assistance/Crisis [facility]) M-F 8am-3pm   [address] [place], [place] [address]   [phone] Services include: laundry, barbering, support groups, case management, phone  & computer access, showers, AA/NA mtgs, mental health/substance abuse nurse, job skills class, disability information, VA assistance, spiritual classes, etc.   [facility]   [address], [place]     [place]              [employer] (women and children)       [place]. [place], [place] [address] [phone] [email or web address] for application and process Application Required  [facility]   [address]    [place] [address]     [phone]                    [place],  [address] [phone](OILNZVJK application appt.) Application Required  [facility] (women only)    [address]     [place],  [address]     [phone]  Intake starts 6pm daily Need valid ID, SSC, & Police report [facility] [employer] [address] [place], [place] [phone] Application Required  [employer] (men only)     [place].      [place], [place]       [place] (Pregnant women only) [address] [place], [place]  [facility]      [place] [address]      [place], [place] [address]     [phone]             [facility] [address] [place], [place] 90 day commitment/SA/Application process  [facility](men only)     [address]     [place], [place]       Check-in at [facility] [address] [place], [place] [address] [phone] Men/Women/Women and Children must be there by 7 pm  [place], [place]                  Take your usual prescriptions as previously directed.  Call your regular medical doctor tomorrow to schedule a follow up appointment within the week. Call the outpatient resources given to you today if you are interested in detox programs.  Return to the Emergency Department immediately sooner if worsening.

## 2018-10-18 NOTE — ED Notes (Signed)
Pt. Was able to ambulate without assistant. Pt. Did not stumble and understood commands, and was able to communicate his needs.

## 2018-10-18 NOTE — ED Triage Notes (Signed)
Pt brought to ED via [facility] for alcohol intoxication. Pt was found laying in the middle of [place].
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
## 2018-10-21 ENCOUNTER — Other Ambulatory Visit: Payer: Self-pay

## 2018-10-21 ENCOUNTER — Telehealth: Payer: Self-pay | Admitting: Family Medicine

## 2018-10-21 NOTE — Telephone Encounter (Signed)
Medication list updated. Please advise about Topamax

## 2018-10-21 NOTE — Patient Outreach (Signed)
[facility]) Care Management  10/21/2018  Anthony [name] 09/13/1961 [id number]    Telephone Screen Referral Date :10/10/2018 Referral Source:[facility] ED Census Referral Reason:ED Utilization Insurance:[employer]   Outreach attempt # 3 to the patient for screening.  No answer. HIPAA compliant voicemail left with contact information.  Plan: 3rd outreach attempt has been made. If no response to calls and letter in ten business days [name] will proceed with case closure.    Lazaro Arms RN, BSN, [place] Direct Dial:  [phone]  Fax: [phone]

## 2018-10-21 NOTE — Telephone Encounter (Signed)
Copied from [place] [phone]. Topic: Quick Communication - See Telephone Encounter >> Oct 21, 2018  2:44 PM Conception Chancy, NT wrote: CRM for notification. See Telephone encounter for: 10/21/18.  Patient mother in law Inez Catalina is calling and states that when looking at his after visit summary from visit 10/14/18 his medication list is not up to date. She states he is not taking the following medications listed below.  amoxicillin (AMOXIL) 500 MG capsule traMADol (ULTRAM) 50 MG tablet HYDROcodone-acetaminophen (NORCO/VICODIN) 5-325 MG tablet  Also states topiramate (TOPAMAX) 50 MG tablet is listed twice. She states his primary doctor needs to look into taking him off this medication as it causes back pain and he has been having back pain.  She states his medication list needs to be looked over.

## 2018-10-24 ENCOUNTER — Emergency Department (HOSPITAL_COMMUNITY)
Admission: EM | Admit: 2018-10-24 | Discharge: 2018-10-24 | Disposition: A | Discharge status: Home / Self Care | Payer: Medicare Other | Attending: Emergency Medicine | Admitting: Emergency Medicine

## 2018-10-24 ENCOUNTER — Encounter (HOSPITAL_COMMUNITY): Payer: Self-pay | Admitting: Emergency Medicine

## 2018-10-24 ENCOUNTER — Other Ambulatory Visit: Payer: Self-pay

## 2018-10-24 DIAGNOSIS — Z7984 Long term (current) use of oral hypoglycemic drugs: Secondary | ICD-10-CM | POA: Insufficient documentation

## 2018-10-24 DIAGNOSIS — F10920 Alcohol use, unspecified with intoxication, uncomplicated: Secondary | ICD-10-CM | POA: Diagnosis not present

## 2018-10-24 DIAGNOSIS — Z87891 Personal history of nicotine dependence: Secondary | ICD-10-CM | POA: Diagnosis not present

## 2018-10-24 DIAGNOSIS — Z79899 Other long term (current) drug therapy: Secondary | ICD-10-CM | POA: Insufficient documentation

## 2018-10-24 DIAGNOSIS — F1020 Alcohol dependence, uncomplicated: Secondary | ICD-10-CM | POA: Diagnosis present

## 2018-10-24 DIAGNOSIS — I10 Essential (primary) hypertension: Secondary | ICD-10-CM | POA: Insufficient documentation

## 2018-10-24 DIAGNOSIS — Y908 Blood alcohol level of 240 mg/100 ml or more: Secondary | ICD-10-CM | POA: Diagnosis not present

## 2018-10-24 DIAGNOSIS — M549 Dorsalgia, unspecified: Secondary | ICD-10-CM | POA: Diagnosis not present

## 2018-10-24 DIAGNOSIS — E119 Type 2 diabetes mellitus without complications: Secondary | ICD-10-CM | POA: Diagnosis not present

## 2018-10-24 LAB — BASIC METABOLIC PANEL
Anion gap: 10 (ref 5–15)
BUN: 25 mg/dL — ABNORMAL HIGH (ref 6–20)
CHLORIDE: 105 mmol/L (ref 98–111)
CO2: 21 mmol/L — ABNORMAL LOW (ref 22–32)
CREATININE: 1.4 mg/dL — AB (ref 0.61–1.24)
Calcium: 8.4 mg/dL — ABNORMAL LOW (ref 8.9–10.3)
GFR, EST NON AFRICAN AMERICAN: 55 mL/min — AB (ref >60)
Glucose, Bld: 107 mg/dL — ABNORMAL HIGH (ref 70–99)
POTASSIUM: 4.1 mmol/L (ref 3.5–5.1)
SODIUM: 136 mmol/L (ref 135–145)

## 2018-10-24 LAB — ETHANOL: ALCOHOL ETHYL (B): 250 mg/dL — AB (ref <10)
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
## 2018-10-24 NOTE — ED Notes (Signed)
RN advised that pt walked out of department.  Pt was saying he needed to call his sister she would be home at 17, pt has a phone.

## 2018-10-24 NOTE — ED Notes (Signed)
Pt ambulatory to restroom
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
## 2018-10-24 NOTE — ED Notes (Signed)
Pt ambulatory to the bathroom, unsteady gait, watching patient to keep in room. Pt is laughing about about about random things. " he is still here, he did not leave the planet"

## 2018-10-24 NOTE — Discharge Instructions (Signed)
Please review the outpatient alcohol treatment center options

## 2018-10-24 NOTE — ED Triage Notes (Signed)
EMS picked patient up off street, pt states he has drank a case of beer today. Live with a  Friend. States he would like to get help. Last rehab was 2 years ago.

## 2018-10-24 NOTE — ED Provider Notes (Signed)
Pt found no longer in room/ED. Eloped.    Francine Graven, DO 10/24/18 1700

## 2018-10-24 NOTE — ED Notes (Signed)
Pt walking out of room wanting to leave.

## 2018-10-25 ENCOUNTER — Other Ambulatory Visit: Payer: Self-pay

## 2018-10-25 ENCOUNTER — Telehealth (HOSPITAL_COMMUNITY): Payer: Self-pay | Admitting: Psychology

## 2018-10-25 ENCOUNTER — Emergency Department (HOSPITAL_COMMUNITY): Payer: Medicare Other

## 2018-10-25 ENCOUNTER — Encounter (HOSPITAL_COMMUNITY): Payer: Self-pay | Admitting: Emergency Medicine

## 2018-10-25 ENCOUNTER — Emergency Department (HOSPITAL_COMMUNITY)
Admission: EM | Admit: 2018-10-25 | Discharge: 2018-10-25 | Disposition: A | Discharge status: Home / Self Care | Payer: Medicare Other | Attending: Emergency Medicine | Admitting: Emergency Medicine

## 2018-10-25 DIAGNOSIS — I10 Essential (primary) hypertension: Secondary | ICD-10-CM | POA: Insufficient documentation

## 2018-10-25 DIAGNOSIS — F10920 Alcohol use, unspecified with intoxication, uncomplicated: Secondary | ICD-10-CM

## 2018-10-25 DIAGNOSIS — Z79899 Other long term (current) drug therapy: Secondary | ICD-10-CM | POA: Insufficient documentation

## 2018-10-25 DIAGNOSIS — Z87891 Personal history of nicotine dependence: Secondary | ICD-10-CM | POA: Insufficient documentation

## 2018-10-25 DIAGNOSIS — Y998 Other external cause status: Secondary | ICD-10-CM | POA: Insufficient documentation

## 2018-10-25 DIAGNOSIS — Y33XXXA Other specified events, undetermined intent, initial encounter: Secondary | ICD-10-CM | POA: Insufficient documentation

## 2018-10-25 DIAGNOSIS — Y939 Activity, unspecified: Secondary | ICD-10-CM | POA: Insufficient documentation

## 2018-10-25 DIAGNOSIS — S39012A Strain of muscle, fascia and tendon of lower back, initial encounter: Secondary | ICD-10-CM | POA: Diagnosis not present

## 2018-10-25 DIAGNOSIS — S3992XA Unspecified injury of lower back, initial encounter: Secondary | ICD-10-CM | POA: Diagnosis present

## 2018-10-25 DIAGNOSIS — E785 Hyperlipidemia, unspecified: Secondary | ICD-10-CM | POA: Insufficient documentation

## 2018-10-25 DIAGNOSIS — F10220 Alcohol dependence with intoxication, uncomplicated: Secondary | ICD-10-CM | POA: Insufficient documentation

## 2018-10-25 DIAGNOSIS — Y929 Unspecified place or not applicable: Secondary | ICD-10-CM | POA: Diagnosis not present

## 2018-10-25 DIAGNOSIS — E119 Type 2 diabetes mellitus without complications: Secondary | ICD-10-CM | POA: Diagnosis not present

## 2018-10-25 DIAGNOSIS — Z7984 Long term (current) use of oral hypoglycemic drugs: Secondary | ICD-10-CM | POA: Insufficient documentation

## 2018-10-25 NOTE — Discharge Instructions (Signed)
See your doctor for recheck and follow up for help with alcohol abuse.

## 2018-10-25 NOTE — ED Triage Notes (Addendum)
Pt was found by EMS lying on the ground. Pt visibly intoxicated, has drank a 12 pack per pt. Was seen yesterday for the same and left AMA.

## 2018-10-25 NOTE — Telephone Encounter (Signed)
If he is having side effects from Topamax, he can decrease the dose from 50 mg twice daily to 50 mg at night and continue weaning medication off if needed.  Med list can be updated.  Thanks, BJ

## 2018-10-25 NOTE — ED Notes (Signed)
Reported by staff member that pt walked out of dept.

## 2018-10-25 NOTE — ED Notes (Signed)
Pt walked out of the dept.  Dr. Reather Converse aware.

## 2018-10-25 NOTE — Telephone Encounter (Signed)
Spoke with [name] and gave instructions for patient to wean off medication. Verbalized understanding.
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
## 2018-10-25 NOTE — Clinical Social Work Note (Signed)
Attempted to see pt in response to CSW consult.  Informed pt had left ED.

## 2018-10-26 ENCOUNTER — Ambulatory Visit: Payer: Self-pay | Admitting: Family Medicine

## 2018-10-29 ENCOUNTER — Other Ambulatory Visit: Payer: Self-pay

## 2018-10-29 ENCOUNTER — Encounter (HOSPITAL_COMMUNITY): Payer: Self-pay | Admitting: Emergency Medicine

## 2018-10-29 ENCOUNTER — Emergency Department (HOSPITAL_COMMUNITY)
Admission: EM | Admit: 2018-10-29 | Discharge: 2018-10-29 | Disposition: A | Discharge status: Home / Self Care | Payer: Medicare Other | Attending: Emergency Medicine | Admitting: Emergency Medicine

## 2018-10-29 DIAGNOSIS — I10 Essential (primary) hypertension: Secondary | ICD-10-CM | POA: Diagnosis not present

## 2018-10-29 DIAGNOSIS — Z87891 Personal history of nicotine dependence: Secondary | ICD-10-CM | POA: Diagnosis not present

## 2018-10-29 DIAGNOSIS — E119 Type 2 diabetes mellitus without complications: Secondary | ICD-10-CM | POA: Diagnosis not present

## 2018-10-29 DIAGNOSIS — Z7984 Long term (current) use of oral hypoglycemic drugs: Secondary | ICD-10-CM | POA: Insufficient documentation

## 2018-10-29 DIAGNOSIS — F10120 Alcohol abuse with intoxication, uncomplicated: Secondary | ICD-10-CM | POA: Diagnosis present

## 2018-10-29 DIAGNOSIS — F10920 Alcohol use, unspecified with intoxication, uncomplicated: Secondary | ICD-10-CM

## 2018-10-29 DIAGNOSIS — Z79899 Other long term (current) drug therapy: Secondary | ICD-10-CM | POA: Diagnosis not present

## 2018-10-29 LAB — CBC
HEMATOCRIT: 37.7 % — AB (ref 39.0–52.0)
HEMOGLOBIN: 12.5 g/dL — AB (ref 13.0–17.0)
MCH: 31.3 pg (ref 26.0–34.0)
MCHC: 33.2 g/dL (ref 30.0–36.0)
MCV: 94.3 fL (ref 80.0–100.0)
NRBC: 0 % (ref 0.0–0.2)
Platelets: 245 10*3/uL (ref 150–400)
RBC: 4 MIL/uL — ABNORMAL LOW (ref 4.22–5.81)
RDW: 13.3 % (ref 11.5–15.5)
WBC: 11.3 10*3/uL — ABNORMAL HIGH (ref 4.0–10.5)

## 2018-10-29 LAB — ETHANOL: Alcohol, Ethyl (B): 242 mg/dL — ABNORMAL HIGH (ref <10)

## 2018-10-29 LAB — COMPREHENSIVE METABOLIC PANEL
ALBUMIN: 3.8 g/dL (ref 3.5–5.0)
ALT: 92 U/L — ABNORMAL HIGH (ref 0–44)
AST: 180 U/L — AB (ref 15–41)
Alkaline Phosphatase: 118 U/L (ref 38–126)
Anion gap: 9 (ref 5–15)
BILIRUBIN TOTAL: 0.7 mg/dL (ref 0.3–1.2)
BUN: 18 mg/dL (ref 6–20)
CO2: 17 mmol/L — AB (ref 22–32)
Calcium: 8.5 mg/dL — ABNORMAL LOW (ref 8.9–10.3)
Chloride: 105 mmol/L (ref 98–111)
Creatinine, Ser: 0.79 mg/dL (ref 0.61–1.24)
GFR calc Af Amer: >60 mL/min (ref >60)
GFR calc non Af Amer: >60 mL/min (ref >60)
GLUCOSE: 88 mg/dL (ref 70–99)
Potassium: 3.5 mmol/L (ref 3.5–5.1)
SODIUM: 131 mmol/L — AB (ref 135–145)
TOTAL PROTEIN: 7.6 g/dL (ref 6.5–8.1)

## 2018-10-29 LAB — CBG MONITORING, ED: Glucose-Capillary: 111 mg/dL — ABNORMAL HIGH (ref 70–99)

## 2018-10-29 NOTE — Discharge Instructions (Addendum)
Return as needed.  Some information provided to help you with assistance in stopping drinking if the if you want to.

## 2018-10-29 NOTE — ED Notes (Signed)
Labs have been drawn.
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
## 2018-10-29 NOTE — ED Notes (Signed)
Pt has eaten meal and is watching tv

## 2018-10-29 NOTE — ED Notes (Signed)
Pt reports he is hungry  After 3 40 oz beers feels that his sugar dropped and wants to watch the football game and have something to eat  Meal provided

## 2018-10-29 NOTE — ED Notes (Signed)
Pt ambulatory to waiting room. Pt verbalized understanding of discharge instructions.   
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
## 2018-11-01 ENCOUNTER — Ambulatory Visit: Payer: Medicare Other | Admitting: Family Medicine

## 2018-11-01 ENCOUNTER — Telehealth (HOSPITAL_COMMUNITY): Payer: Self-pay | Admitting: Psychology

## 2018-11-01 ENCOUNTER — Ambulatory Visit (HOSPITAL_COMMUNITY): Payer: Medicare Other | Admitting: Psychology

## 2018-11-01 DIAGNOSIS — Z0289 Encounter for other administrative examinations: Secondary | ICD-10-CM
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
## 2018-11-03 ENCOUNTER — Other Ambulatory Visit: Payer: Self-pay

## 2018-11-03 NOTE — Patient Outreach (Signed)
[facility]) Care Management  11/03/2018  DENNISE BAMBER 17-Apr-1961 [id number]    Multiple attempts to establish contact with patient without success. No response from calls or letter mailed to patient. Case is being closed at this time.  Lazaro Arms RN, BSN, [place] Direct Dial:  [phone]  Fax: [phone]

## 2018-11-05 ENCOUNTER — Emergency Department (HOSPITAL_COMMUNITY)
Admission: EM | Admit: 2018-11-05 | Discharge: 2018-11-06 | Disposition: A | Discharge status: Home / Self Care | Payer: Medicare Other | Attending: Emergency Medicine | Admitting: Emergency Medicine

## 2018-11-05 ENCOUNTER — Other Ambulatory Visit: Payer: Self-pay

## 2018-11-05 ENCOUNTER — Encounter (HOSPITAL_COMMUNITY): Payer: Self-pay | Admitting: Emergency Medicine

## 2018-11-05 ENCOUNTER — Emergency Department (HOSPITAL_COMMUNITY): Payer: Medicare Other

## 2018-11-05 DIAGNOSIS — Z79899 Other long term (current) drug therapy: Secondary | ICD-10-CM | POA: Insufficient documentation

## 2018-11-05 DIAGNOSIS — Z7984 Long term (current) use of oral hypoglycemic drugs: Secondary | ICD-10-CM | POA: Diagnosis not present

## 2018-11-05 DIAGNOSIS — F10229 Alcohol dependence with intoxication, unspecified: Secondary | ICD-10-CM | POA: Diagnosis present

## 2018-11-05 DIAGNOSIS — Z87891 Personal history of nicotine dependence: Secondary | ICD-10-CM | POA: Diagnosis not present

## 2018-11-05 DIAGNOSIS — I10 Essential (primary) hypertension: Secondary | ICD-10-CM | POA: Diagnosis not present

## 2018-11-05 DIAGNOSIS — S0003XA Contusion of scalp, initial encounter: Secondary | ICD-10-CM | POA: Diagnosis not present

## 2018-11-05 DIAGNOSIS — F1010 Alcohol abuse, uncomplicated: Secondary | ICD-10-CM | POA: Insufficient documentation

## 2018-11-05 DIAGNOSIS — Y929 Unspecified place or not applicable: Secondary | ICD-10-CM | POA: Diagnosis not present

## 2018-11-05 DIAGNOSIS — Y939 Activity, unspecified: Secondary | ICD-10-CM | POA: Insufficient documentation

## 2018-11-05 DIAGNOSIS — E119 Type 2 diabetes mellitus without complications: Secondary | ICD-10-CM | POA: Diagnosis not present

## 2018-11-05 DIAGNOSIS — Y999 Unspecified external cause status: Secondary | ICD-10-CM | POA: Insufficient documentation

## 2018-11-05 HISTORY — DX: Alcohol abuse, uncomplicated: F10.10

## 2018-11-05 LAB — ETHANOL: ALCOHOL ETHYL (B): 242 mg/dL — AB (ref <10)

## 2018-11-05 LAB — BASIC METABOLIC PANEL
Anion gap: 8 (ref 5–15)
BUN: 15 mg/dL (ref 6–20)
CO2: 25 mmol/L (ref 22–32)
CREATININE: 0.73 mg/dL (ref 0.61–1.24)
Calcium: 8.1 mg/dL — ABNORMAL LOW (ref 8.9–10.3)
Chloride: 103 mmol/L (ref 98–111)
GFR calc Af Amer: >60 mL/min (ref >60)
GFR calc non Af Amer: >60 mL/min (ref >60)
GLUCOSE: 195 mg/dL — AB (ref 70–99)
Potassium: 4 mmol/L (ref 3.5–5.1)
Sodium: 136 mmol/L (ref 135–145)

## 2018-11-05 LAB — CBG MONITORING, ED: Glucose-Capillary: 161 mg/dL — ABNORMAL HIGH (ref 70–99)

## 2018-11-05 MED ORDER — ACETAMINOPHEN 500 MG PO TABS
1000.0000 mg | ORAL_TABLET | Freq: Once | ORAL | Status: AC
  Administered 2018-11-06: 1000 mg via ORAL
  Filled 2018-11-05: qty 2

## 2018-11-05 NOTE — ED Notes (Signed)
Pt. Ambulated well with some instability at first, but seemed to do well on his own walking to and from the nurses station.
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
## 2018-11-05 NOTE — Discharge Instructions (Addendum)
Your examination shows some bruising and contusions about your scalp.  Some muscle strain of your neck and back.  The CT scan of your head and neck are both negative.  Please use Tylenol extra strength for soreness.  Please see Dr. Martinique or return to the emergency department if any changes in your condition, problems, or concerns.

## 2018-11-07 ENCOUNTER — Other Ambulatory Visit: Payer: Self-pay | Admitting: *Deleted
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
## 2018-11-08 ENCOUNTER — Ambulatory Visit (INDEPENDENT_AMBULATORY_CARE_PROVIDER_SITE_OTHER): Payer: Medicare Other | Admitting: Psychology

## 2018-11-08 ENCOUNTER — Encounter (HOSPITAL_COMMUNITY): Payer: Self-pay | Admitting: Psychology

## 2018-11-08 DIAGNOSIS — F1020 Alcohol dependence, uncomplicated: Secondary | ICD-10-CM | POA: Diagnosis not present
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
## 2018-11-16 ENCOUNTER — Telehealth: Payer: Self-pay | Admitting: Family Medicine

## 2018-11-16 NOTE — Telephone Encounter (Signed)
Dr [place] patient

## 2018-11-16 NOTE — Telephone Encounter (Signed)
Copied from [place] [id number]. Topic: General - Inquiry >> Nov 15, 2018  1:33 PM Scherrie Gerlach wrote: Reason for CRM: Inez Catalina wants to know if a "[place] [facility] [place]" visit was done at any of the pt's appts? She received list from insurance company and this was on a check list to see if pt has done. Inez Catalina would like a call back.

## 2018-11-18 ENCOUNTER — Encounter (HOSPITAL_COMMUNITY): Payer: Self-pay

## 2018-11-18 ENCOUNTER — Emergency Department (HOSPITAL_COMMUNITY)
Admission: EM | Admit: 2018-11-18 | Discharge: 2018-11-19 | Disposition: A | Discharge status: Home / Self Care | Payer: Medicare Other | Attending: Emergency Medicine | Admitting: Emergency Medicine

## 2018-11-18 ENCOUNTER — Other Ambulatory Visit: Payer: Self-pay

## 2018-11-18 DIAGNOSIS — F10120 Alcohol abuse with intoxication, uncomplicated: Secondary | ICD-10-CM | POA: Insufficient documentation

## 2018-11-18 DIAGNOSIS — Z7982 Long term (current) use of aspirin: Secondary | ICD-10-CM | POA: Diagnosis not present

## 2018-11-18 DIAGNOSIS — Z79899 Other long term (current) drug therapy: Secondary | ICD-10-CM | POA: Insufficient documentation

## 2018-11-18 DIAGNOSIS — E119 Type 2 diabetes mellitus without complications: Secondary | ICD-10-CM | POA: Diagnosis not present

## 2018-11-18 DIAGNOSIS — Z87891 Personal history of nicotine dependence: Secondary | ICD-10-CM | POA: Insufficient documentation

## 2018-11-18 DIAGNOSIS — I10 Essential (primary) hypertension: Secondary | ICD-10-CM | POA: Diagnosis not present

## 2018-11-18 DIAGNOSIS — E162 Hypoglycemia, unspecified: Secondary | ICD-10-CM

## 2018-11-18 DIAGNOSIS — F10920 Alcohol use, unspecified with intoxication, uncomplicated: Secondary | ICD-10-CM

## 2018-11-18 LAB — CBG MONITORING, ED
GLUCOSE-CAPILLARY: 61 mg/dL — AB (ref 70–99)
Glucose-Capillary: 91 mg/dL (ref 70–99)
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
## 2018-11-18 NOTE — ED Notes (Signed)
Pt given meal and sprite

## 2018-11-19 DIAGNOSIS — F10120 Alcohol abuse with intoxication, uncomplicated: Secondary | ICD-10-CM | POA: Diagnosis not present

## 2018-11-19 LAB — CBG MONITORING, ED
Glucose-Capillary: 108 mg/dL — ABNORMAL HIGH (ref 70–99)
Glucose-Capillary: 61 mg/dL — ABNORMAL LOW (ref 70–99)
Glucose-Capillary: 124 mg/dL — ABNORMAL HIGH (ref 70–99)
Glucose-Capillary: 59 mg/dL — ABNORMAL LOW (ref 70–99)
Glucose-Capillary: 86 mg/dL (ref 70–99)

## 2018-11-19 NOTE — ED Notes (Addendum)
Pt CBG 59. EDP notified. Gave pt peanut butter crackers and a coke

## 2018-11-19 NOTE — ED Notes (Signed)
Pt given another meal and sprite
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
## 2018-11-19 NOTE — Discharge Instructions (Addendum)
You need to follow through with getting the help you need to stop drinking.  Please monitor your blood sugars closely.

## 2018-11-22 ENCOUNTER — Ambulatory Visit: Payer: Self-pay | Admitting: Family Medicine

## 2018-11-23 ENCOUNTER — Ambulatory Visit: Payer: Self-pay | Admitting: Family Medicine

## 2018-11-23 DIAGNOSIS — Z79899 Other long term (current) drug therapy: Secondary | ICD-10-CM | POA: Diagnosis not present

## 2018-11-23 DIAGNOSIS — E119 Type 2 diabetes mellitus without complications: Secondary | ICD-10-CM | POA: Diagnosis not present

## 2018-11-23 DIAGNOSIS — I10 Essential (primary) hypertension: Secondary | ICD-10-CM | POA: Diagnosis not present

## 2018-11-23 DIAGNOSIS — G8929 Other chronic pain: Secondary | ICD-10-CM | POA: Diagnosis not present

## 2018-11-23 DIAGNOSIS — M545 Low back pain: Secondary | ICD-10-CM | POA: Insufficient documentation

## 2018-11-23 DIAGNOSIS — Z87891 Personal history of nicotine dependence: Secondary | ICD-10-CM | POA: Insufficient documentation

## 2018-11-23 DIAGNOSIS — Z7984 Long term (current) use of oral hypoglycemic drugs: Secondary | ICD-10-CM | POA: Diagnosis not present

## 2018-11-23 NOTE — Telephone Encounter (Signed)
Patient has appointment scheduled on 11/28/18 to discuss with PCP. No further assistance needed at this time.

## 2018-11-24 ENCOUNTER — Emergency Department (HOSPITAL_COMMUNITY)
Admission: EM | Admit: 2018-11-24 | Discharge: 2018-11-24 | Disposition: A | Discharge status: Home / Self Care | Payer: Medicare Other | Attending: Emergency Medicine | Admitting: Emergency Medicine

## 2018-11-24 ENCOUNTER — Encounter (HOSPITAL_COMMUNITY): Payer: Self-pay | Admitting: *Deleted

## 2018-11-24 ENCOUNTER — Other Ambulatory Visit: Payer: Self-pay

## 2018-11-24 DIAGNOSIS — M545 Low back pain: Secondary | ICD-10-CM | POA: Diagnosis not present

## 2018-11-24 DIAGNOSIS — F1010 Alcohol abuse, uncomplicated: Secondary | ICD-10-CM

## 2018-11-24 LAB — CBG MONITORING, ED: GLUCOSE-CAPILLARY: 140 mg/dL — AB (ref 70–99)
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
## 2018-11-25 ENCOUNTER — Ambulatory Visit: Payer: Self-pay | Admitting: Family Medicine

## 2018-11-28 ENCOUNTER — Ambulatory Visit: Payer: Self-pay | Admitting: Family Medicine

## 2018-12-05 ENCOUNTER — Ambulatory Visit: Payer: Medicare Other | Admitting: Family Medicine

## 2018-12-19 ENCOUNTER — Emergency Department (HOSPITAL_COMMUNITY)
Admission: EM | Admit: 2018-12-19 | Discharge: 2018-12-19 | Disposition: A | Discharge status: Home / Self Care | Payer: Medicare Other | Attending: Emergency Medicine | Admitting: Emergency Medicine

## 2018-12-19 ENCOUNTER — Other Ambulatory Visit: Payer: Self-pay

## 2018-12-19 ENCOUNTER — Encounter (HOSPITAL_COMMUNITY): Payer: Self-pay | Admitting: Emergency Medicine

## 2018-12-19 DIAGNOSIS — Y92009 Unspecified place in unspecified non-institutional (private) residence as the place of occurrence of the external cause: Secondary | ICD-10-CM | POA: Insufficient documentation

## 2018-12-19 DIAGNOSIS — Z7984 Long term (current) use of oral hypoglycemic drugs: Secondary | ICD-10-CM | POA: Diagnosis not present

## 2018-12-19 DIAGNOSIS — Y999 Unspecified external cause status: Secondary | ICD-10-CM | POA: Insufficient documentation

## 2018-12-19 DIAGNOSIS — I10 Essential (primary) hypertension: Secondary | ICD-10-CM | POA: Insufficient documentation

## 2018-12-19 DIAGNOSIS — Z87891 Personal history of nicotine dependence: Secondary | ICD-10-CM | POA: Diagnosis not present

## 2018-12-19 DIAGNOSIS — E119 Type 2 diabetes mellitus without complications: Secondary | ICD-10-CM | POA: Insufficient documentation

## 2018-12-19 DIAGNOSIS — S3992XA Unspecified injury of lower back, initial encounter: Secondary | ICD-10-CM | POA: Diagnosis present

## 2018-12-19 DIAGNOSIS — F10229 Alcohol dependence with intoxication, unspecified: Secondary | ICD-10-CM | POA: Insufficient documentation

## 2018-12-19 DIAGNOSIS — Z79899 Other long term (current) drug therapy: Secondary | ICD-10-CM | POA: Insufficient documentation

## 2018-12-19 DIAGNOSIS — F10920 Alcohol use, unspecified with intoxication, uncomplicated: Secondary | ICD-10-CM

## 2018-12-19 DIAGNOSIS — Y9389 Activity, other specified: Secondary | ICD-10-CM | POA: Diagnosis not present

## 2018-12-19 DIAGNOSIS — Z7982 Long term (current) use of aspirin: Secondary | ICD-10-CM | POA: Insufficient documentation

## 2018-12-19 HISTORY — DX: Alcohol abuse, uncomplicated: F10.10

## 2018-12-19 NOTE — ED Notes (Signed)
Pt continues to sleep.
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
## 2019-01-06 ENCOUNTER — Other Ambulatory Visit: Payer: Self-pay | Admitting: Family Medicine

## 2019-01-06 MED ORDER — CITALOPRAM HYDROBROMIDE 20 MG PO TABS: 20.0000 mg | ORAL_TABLET | Freq: Every day | ORAL | 0 refills | Status: DC

## 2019-01-06 MED ORDER — METFORMIN HCL 1000 MG PO TABS: 1000.0000 mg | ORAL_TABLET | Freq: Two times a day (BID) | ORAL | 1 refills | Status: DC

## 2019-01-06 MED ORDER — GABAPENTIN 300 MG PO CAPS: 300.0000 mg | ORAL_CAPSULE | Freq: Two times a day (BID) | ORAL | 1 refills | Status: DC

## 2019-01-06 MED ORDER — ATORVASTATIN CALCIUM 40 MG PO TABS: 40.0000 mg | ORAL_TABLET | Freq: Every day | ORAL | 1 refills | Status: DC

## 2019-01-06 MED ORDER — LISINOPRIL 10 MG PO TABS: 10.0000 mg | ORAL_TABLET | Freq: Every day | ORAL | 1 refills | Status: DC

## 2019-01-06 NOTE — Telephone Encounter (Signed)
Copied from [place]. Topic: Quick Communication - Rx Refill/Question >> Jan 06, 2019  1:45 PM Carolyn Stare wrote: Pt is asking if these meds can be called in today he really need the gabapentin     atorvastatin (LIPITOR) 40 MG tablet       citalopram (CELEXA) 20 MG tablet       gabapentin (NEURONTIN) 300 MG capsule   lisinopril (PRINIVIL,ZESTRIL) 10 MG tablet   metFORMIN (GLUCOPHAGE) 1000 MG tablet   [place]             Preferred Pharmacy (with phone number or street name): ***  Agent: Please be advised that RX refills may take up to 3 business days. We ask that you follow-up with your pharmacy.

## 2019-01-30 ENCOUNTER — Encounter: Payer: Self-pay | Admitting: Family Medicine

## 2019-01-30 ENCOUNTER — Ambulatory Visit (INDEPENDENT_AMBULATORY_CARE_PROVIDER_SITE_OTHER): Payer: Medicare Other | Admitting: Family Medicine

## 2019-01-30 VITALS — BP 110/66 | HR 102 | Temp 98.5°F | Resp 12 | Ht 68.0 in | Wt 189.1 lb

## 2019-01-30 DIAGNOSIS — R7401 Elevation of levels of liver transaminase levels: Secondary | ICD-10-CM

## 2019-01-30 DIAGNOSIS — M79661 Pain in right lower leg: Secondary | ICD-10-CM

## 2019-01-30 DIAGNOSIS — M79662 Pain in left lower leg: Secondary | ICD-10-CM

## 2019-01-30 DIAGNOSIS — E118 Type 2 diabetes mellitus with unspecified complications: Secondary | ICD-10-CM | POA: Diagnosis not present

## 2019-01-30 DIAGNOSIS — E119 Type 2 diabetes mellitus without complications: Secondary | ICD-10-CM | POA: Diagnosis not present

## 2019-01-30 DIAGNOSIS — I10 Essential (primary) hypertension: Secondary | ICD-10-CM

## 2019-01-30 DIAGNOSIS — R740 Nonspecific elevation of levels of transaminase and lactic acid dehydrogenase [LDH]: Secondary | ICD-10-CM | POA: Diagnosis not present

## 2019-01-30 DIAGNOSIS — F1020 Alcohol dependence, uncomplicated: Secondary | ICD-10-CM

## 2019-01-30 LAB — MICROALBUMIN / CREATININE URINE RATIO
Creatinine,U: 61.3 mg/dL
Microalb Creat Ratio: 1.1 mg/g (ref 0.0–30.0)
Microalb, Ur: <0.7 mg/dL (ref 0.0–1.9)

## 2019-01-30 LAB — POCT GLYCOSYLATED HEMOGLOBIN (HGB A1C): HEMOGLOBIN A1C: 5.8 % — AB (ref 4.0–5.6)

## 2019-01-30 NOTE — Patient Instructions (Addendum)
A few things to remember from today's visit:   Diabetes mellitus without complication ([place]) - Plan: Microalbumin / creatinine urine ratio, POCT glycosylated hemoglobin (Hb A1C)  Essential hypertension  Elevated transaminase level - Plan: US Abdomen Limited RUQ   Calves pain seems to be muscle related. Pulses are good.  Metformin discontinued. If blood sugars go up we can resume metformin by a lower dose. No changes in glipizide or Invokana.  Please be sure medication list is accurate. If a new problem present, please set up appointment sooner than planned today.

## 2019-01-30 NOTE — Assessment & Plan Note (Signed)
HgA1C is 5.8, well-controlled. Recommend stopping metformin. No changes in Invokana or glipizide.  Regular exercise and healthy diet with avoidance of added sugar food intake is an important part of treatment and recommended. Annual eye exam, periodic dental and foot care recommended. F/U in 4 months

## 2019-01-30 NOTE — Assessment & Plan Note (Signed)
BP adequately controlled. No changes in lisinopril 10 mg daily. Recommend low-salt diet. Eye exam is current.
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
## 2019-02-02 ENCOUNTER — Encounter: Payer: Self-pay | Admitting: Family Medicine

## 2019-02-14 ENCOUNTER — Emergency Department (HOSPITAL_COMMUNITY)
Admission: EM | Admit: 2019-02-14 | Discharge: 2019-02-14 | Disposition: A | Discharge status: Home / Self Care | Payer: Medicare Other | Attending: Emergency Medicine | Admitting: Emergency Medicine

## 2019-02-14 ENCOUNTER — Other Ambulatory Visit: Payer: Self-pay

## 2019-02-14 ENCOUNTER — Encounter (HOSPITAL_COMMUNITY): Payer: Self-pay | Admitting: Emergency Medicine

## 2019-02-14 DIAGNOSIS — Y9389 Activity, other specified: Secondary | ICD-10-CM | POA: Insufficient documentation

## 2019-02-14 DIAGNOSIS — E119 Type 2 diabetes mellitus without complications: Secondary | ICD-10-CM | POA: Diagnosis not present

## 2019-02-14 DIAGNOSIS — Z79899 Other long term (current) drug therapy: Secondary | ICD-10-CM | POA: Insufficient documentation

## 2019-02-14 DIAGNOSIS — F419 Anxiety disorder, unspecified: Secondary | ICD-10-CM | POA: Insufficient documentation

## 2019-02-14 DIAGNOSIS — I10 Essential (primary) hypertension: Secondary | ICD-10-CM | POA: Diagnosis not present

## 2019-02-14 DIAGNOSIS — Z87891 Personal history of nicotine dependence: Secondary | ICD-10-CM | POA: Diagnosis not present

## 2019-02-14 DIAGNOSIS — F1020 Alcohol dependence, uncomplicated: Secondary | ICD-10-CM | POA: Diagnosis not present

## 2019-02-14 DIAGNOSIS — Y92009 Unspecified place in unspecified non-institutional (private) residence as the place of occurrence of the external cause: Secondary | ICD-10-CM | POA: Diagnosis not present

## 2019-02-14 DIAGNOSIS — F909 Attention-deficit hyperactivity disorder, unspecified type: Secondary | ICD-10-CM | POA: Insufficient documentation

## 2019-02-14 DIAGNOSIS — Z7984 Long term (current) use of oral hypoglycemic drugs: Secondary | ICD-10-CM | POA: Insufficient documentation

## 2019-02-14 DIAGNOSIS — S20221A Contusion of right back wall of thorax, initial encounter: Secondary | ICD-10-CM | POA: Diagnosis not present

## 2019-02-14 DIAGNOSIS — W269XXA Contact with unspecified sharp object(s), initial encounter: Secondary | ICD-10-CM | POA: Diagnosis not present

## 2019-02-14 DIAGNOSIS — S61011A Laceration without foreign body of right thumb without damage to nail, initial encounter: Secondary | ICD-10-CM | POA: Diagnosis not present

## 2019-02-14 DIAGNOSIS — Y998 Other external cause status: Secondary | ICD-10-CM | POA: Insufficient documentation

## 2019-02-14 DIAGNOSIS — S6991XA Unspecified injury of right wrist, hand and finger(s), initial encounter: Secondary | ICD-10-CM | POA: Diagnosis present

## 2019-02-14 LAB — URINALYSIS, ROUTINE W REFLEX MICROSCOPIC
Bacteria, UA: NONE SEEN
Bilirubin Urine: NEGATIVE
Glucose, UA: >=500 mg/dL — AB
HGB URINE DIPSTICK: NEGATIVE
Ketones, ur: NEGATIVE mg/dL
Leukocytes,Ua: NEGATIVE
Nitrite: NEGATIVE
Protein, ur: NEGATIVE mg/dL
SPECIFIC GRAVITY, URINE: 1.005 (ref 1.005–1.030)
pH: 6 (ref 5.0–8.0)

## 2019-02-14 MED ORDER — LIDOCAINE-EPINEPHRINE-TETRACAINE (LET) SOLUTION
3.0000 mL | Freq: Once | NASAL | Status: AC
  Administered 2019-02-14: 3 mL via TOPICAL
  Filled 2019-02-14: qty 3

## 2019-02-14 NOTE — Discharge Instructions (Addendum)
Do not put ointment or soap on the glue on your thumb or the glue will dissolve too early.  You can just use plain water.  Recheck if the laceration gets infected.  You can take ibuprofen or Tylenol for your pain.  Please be rechecked if you see blood in your urine, you have difficulty swallowing or breathing.

## 2019-02-14 NOTE — ED Notes (Signed)
Pt left thumb cleaned with wound cleanser
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
## 2019-04-18 ENCOUNTER — Other Ambulatory Visit: Payer: Self-pay

## 2019-04-18 NOTE — Patient Outreach (Signed)
[facility] [place] [facility]) Care Management  04/18/2019  Anthony [name] 1961/04/15 [id number]   Medication Adherence call to Anthony [name] Compliant Voice message left with a call back number. Anthony [name] is showing past due under [place].   [place] Management Direct Dial [phone]  Fax [phone] [email or web address]

## 2019-05-01 ENCOUNTER — Other Ambulatory Visit: Payer: Self-pay

## 2019-05-01 NOTE — Patient Outreach (Signed)
[place] [facility]) Care Management  05/01/2019  FAHAD CISSE [birth year] [id number]   Medication Adherence call to Davina Poke Telephone call to Patient regarding Medication Adherence unable to reach patient Mr. Susman is showing past due on Metformin 1000 and Atorvastatin 40 mg under [place].   [place] Management Direct Dial [phone]  Fax [phone] [email or web address]

## 2019-05-13 ENCOUNTER — Emergency Department (HOSPITAL_COMMUNITY): Payer: Medicare Other

## 2019-05-13 ENCOUNTER — Emergency Department (HOSPITAL_COMMUNITY)
Admission: EM | Admit: 2019-05-13 | Discharge: 2019-05-14 | Disposition: A | Discharge status: Home / Self Care | Payer: Medicare Other | Attending: Emergency Medicine | Admitting: Emergency Medicine

## 2019-05-13 ENCOUNTER — Encounter (HOSPITAL_COMMUNITY): Payer: Self-pay | Admitting: Emergency Medicine

## 2019-05-13 ENCOUNTER — Other Ambulatory Visit: Payer: Self-pay

## 2019-05-13 DIAGNOSIS — R740 Nonspecific elevation of levels of transaminase and lactic acid dehydrogenase [LDH]: Secondary | ICD-10-CM | POA: Diagnosis not present

## 2019-05-13 DIAGNOSIS — Y999 Unspecified external cause status: Secondary | ICD-10-CM | POA: Insufficient documentation

## 2019-05-13 DIAGNOSIS — S0990XA Unspecified injury of head, initial encounter: Secondary | ICD-10-CM | POA: Diagnosis not present

## 2019-05-13 DIAGNOSIS — Z87891 Personal history of nicotine dependence: Secondary | ICD-10-CM | POA: Diagnosis not present

## 2019-05-13 DIAGNOSIS — F10920 Alcohol use, unspecified with intoxication, uncomplicated: Secondary | ICD-10-CM | POA: Diagnosis not present

## 2019-05-13 DIAGNOSIS — Z7982 Long term (current) use of aspirin: Secondary | ICD-10-CM | POA: Diagnosis not present

## 2019-05-13 DIAGNOSIS — S299XXA Unspecified injury of thorax, initial encounter: Secondary | ICD-10-CM | POA: Diagnosis not present

## 2019-05-13 DIAGNOSIS — D649 Anemia, unspecified: Secondary | ICD-10-CM | POA: Diagnosis not present

## 2019-05-13 DIAGNOSIS — Y92410 Unspecified street and highway as the place of occurrence of the external cause: Secondary | ICD-10-CM | POA: Diagnosis not present

## 2019-05-13 DIAGNOSIS — E119 Type 2 diabetes mellitus without complications: Secondary | ICD-10-CM | POA: Insufficient documentation

## 2019-05-13 DIAGNOSIS — S0003XA Contusion of scalp, initial encounter: Secondary | ICD-10-CM

## 2019-05-13 DIAGNOSIS — Y908 Blood alcohol level of 240 mg/100 ml or more: Secondary | ICD-10-CM | POA: Insufficient documentation

## 2019-05-13 DIAGNOSIS — S20229A Contusion of unspecified back wall of thorax, initial encounter: Secondary | ICD-10-CM

## 2019-05-13 DIAGNOSIS — Z79899 Other long term (current) drug therapy: Secondary | ICD-10-CM | POA: Diagnosis not present

## 2019-05-13 DIAGNOSIS — M545 Low back pain: Secondary | ICD-10-CM | POA: Diagnosis not present

## 2019-05-13 DIAGNOSIS — D696 Thrombocytopenia, unspecified: Secondary | ICD-10-CM

## 2019-05-13 DIAGNOSIS — I10 Essential (primary) hypertension: Secondary | ICD-10-CM | POA: Insufficient documentation

## 2019-05-13 DIAGNOSIS — Z7984 Long term (current) use of oral hypoglycemic drugs: Secondary | ICD-10-CM | POA: Insufficient documentation

## 2019-05-13 DIAGNOSIS — Y939 Activity, unspecified: Secondary | ICD-10-CM | POA: Diagnosis not present

## 2019-05-13 DIAGNOSIS — R7401 Elevation of levels of liver transaminase levels: Secondary | ICD-10-CM

## 2019-05-13 DIAGNOSIS — M5489 Other dorsalgia: Secondary | ICD-10-CM | POA: Diagnosis not present

## 2019-05-13 DIAGNOSIS — S300XXA Contusion of lower back and pelvis, initial encounter: Secondary | ICD-10-CM | POA: Diagnosis not present

## 2019-05-13 HISTORY — DX: Alcohol dependence, uncomplicated: F10.20

## 2019-05-13 LAB — COMPREHENSIVE METABOLIC PANEL
ALT: 47 U/L — ABNORMAL HIGH (ref 0–44)
AST: 110 U/L — ABNORMAL HIGH (ref 15–41)
Albumin: 3.6 g/dL (ref 3.5–5.0)
Alkaline Phosphatase: 151 U/L — ABNORMAL HIGH (ref 38–126)
Anion gap: 10 (ref 5–15)
BUN: 13 mg/dL (ref 6–20)
CO2: 23 mmol/L (ref 22–32)
Calcium: 8.7 mg/dL — ABNORMAL LOW (ref 8.9–10.3)
Chloride: 101 mmol/L (ref 98–111)
Creatinine, Ser: 0.6 mg/dL — ABNORMAL LOW (ref 0.61–1.24)
GFR calc Af Amer: >60 mL/min (ref >60)
GFR calc non Af Amer: >60 mL/min (ref >60)
Glucose, Bld: 93 mg/dL (ref 70–99)
Potassium: 4 mmol/L (ref 3.5–5.1)
Sodium: 134 mmol/L — ABNORMAL LOW (ref 135–145)
Total Bilirubin: 1.4 mg/dL — ABNORMAL HIGH (ref 0.3–1.2)
Total Protein: 8.1 g/dL (ref 6.5–8.1)

## 2019-05-13 LAB — RAPID URINE DRUG SCREEN, HOSP PERFORMED
Amphetamines: NOT DETECTED
Barbiturates: NOT DETECTED
Benzodiazepines: NOT DETECTED
Cocaine: NOT DETECTED
Opiates: NOT DETECTED
Tetrahydrocannabinol: NOT DETECTED

## 2019-05-13 LAB — CBC WITH DIFFERENTIAL/PLATELET
Abs Immature Granulocytes: 0.02 10*3/uL (ref 0.00–0.07)
Basophils Absolute: 0.1 10*3/uL (ref 0.0–0.1)
Basophils Relative: 1 %
Eosinophils Absolute: 0.1 10*3/uL (ref 0.0–0.5)
Eosinophils Relative: 1 %
HCT: 35 % — ABNORMAL LOW (ref 39.0–52.0)
Hemoglobin: 11.3 g/dL — ABNORMAL LOW (ref 13.0–17.0)
Immature Granulocytes: 0 %
Lymphocytes Relative: 31 %
Lymphs Abs: 2 10*3/uL (ref 0.7–4.0)
MCH: 30.6 pg (ref 26.0–34.0)
MCHC: 32.3 g/dL (ref 30.0–36.0)
MCV: 94.9 fL (ref 80.0–100.0)
Monocytes Absolute: 0.6 10*3/uL (ref 0.1–1.0)
Monocytes Relative: 9 %
Neutro Abs: 3.8 10*3/uL (ref 1.7–7.7)
Neutrophils Relative %: 58 %
Platelets: 103 10*3/uL — ABNORMAL LOW (ref 150–400)
RBC: 3.69 MIL/uL — ABNORMAL LOW (ref 4.22–5.81)
RDW: 13.8 % (ref 11.5–15.5)
WBC: 6.6 10*3/uL (ref 4.0–10.5)
nRBC: 0 % (ref 0.0–0.2)

## 2019-05-13 LAB — SALICYLATE LEVEL: Salicylate Lvl: <7 mg/dL (ref 2.8–30.0)

## 2019-05-13 LAB — ETHANOL: Alcohol, Ethyl (B): 245 mg/dL — ABNORMAL HIGH (ref <10)

## 2019-05-13 LAB — ACETAMINOPHEN LEVEL: Acetaminophen (Tylenol), Serum: <10 ug/mL — ABNORMAL LOW (ref 10–30)
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
## 2019-05-13 NOTE — ED Notes (Signed)
Lab to bedside   Dr Thurnell Garbe to bedside

## 2019-05-13 NOTE — ED Notes (Signed)
to Rad 
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
## 2019-05-13 NOTE — ED Notes (Signed)
Pt request some thing to drink  Diet Ginger ale provided

## 2019-05-14 NOTE — Discharge Instructions (Signed)
Your blood tests show signs of slcohol damaging your liver. If you continue to drink, you will develop cirrhosis of the liver. Please use the outpatient resources guide to get help with your alcohol abuse.  You may take ibuprofen as needed for pain.  Apply ice to sore areas as needed.

## 2019-05-14 NOTE — ED Notes (Signed)
Pt resting with eyes closed, will arouse when spoken to

## 2019-05-14 NOTE — ED Notes (Signed)
Pt continues to rest on his side, no distress noted,
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
## 2019-05-22 ENCOUNTER — Other Ambulatory Visit: Payer: Self-pay

## 2019-05-22 NOTE — Patient Outreach (Signed)
[facility]) Care Management  05/22/2019  JAQUAVEON BILAL 08/26/1961 [id number]   Medication Adherence call to Mr. [facility] Voice message left with a call back number. Mr. Mollett is showing past due on Lisinopril 10 mg.Atorvastatin 40 mg and Metformin 1000 mg under [place].   [place] Management Direct Dial [phone]  Fax [phone] [email or web address]

## 2019-05-31 ENCOUNTER — Ambulatory Visit: Payer: Self-pay | Admitting: Family Medicine

## 2019-06-09 ENCOUNTER — Ambulatory Visit: Payer: Medicare Other | Admitting: Family Medicine

## 2019-11-13 ENCOUNTER — Encounter (HOSPITAL_COMMUNITY): Payer: Self-pay | Admitting: Emergency Medicine

## 2019-11-13 ENCOUNTER — Other Ambulatory Visit: Payer: Self-pay

## 2019-11-13 ENCOUNTER — Emergency Department (HOSPITAL_COMMUNITY)
Admission: EM | Admit: 2019-11-13 | Discharge: 2019-11-13 | Disposition: A | Discharge status: Home / Self Care | Payer: Medicare Other | Attending: Emergency Medicine | Admitting: Emergency Medicine

## 2019-11-13 DIAGNOSIS — Z743 Need for continuous supervision: Secondary | ICD-10-CM | POA: Diagnosis not present

## 2019-11-13 DIAGNOSIS — R0902 Hypoxemia: Secondary | ICD-10-CM | POA: Diagnosis not present

## 2019-11-13 DIAGNOSIS — M545 Low back pain: Secondary | ICD-10-CM | POA: Diagnosis not present

## 2019-11-13 DIAGNOSIS — I10 Essential (primary) hypertension: Secondary | ICD-10-CM | POA: Diagnosis not present

## 2019-11-13 DIAGNOSIS — Z5321 Procedure and treatment not carried out due to patient leaving prior to being seen by health care provider: Secondary | ICD-10-CM | POA: Diagnosis not present

## 2019-11-13 DIAGNOSIS — M5489 Other dorsalgia: Secondary | ICD-10-CM | POA: Diagnosis not present

## 2019-11-13 DIAGNOSIS — R519 Headache, unspecified: Secondary | ICD-10-CM | POA: Diagnosis present
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
## 2019-11-13 NOTE — ED Notes (Signed)
Patient triaged but left from hallway bed before attending saw patient.

## 2019-11-16 ENCOUNTER — Emergency Department (HOSPITAL_COMMUNITY): Payer: Medicare Other

## 2019-11-16 ENCOUNTER — Emergency Department (HOSPITAL_COMMUNITY)
Admission: EM | Admit: 2019-11-16 | Discharge: 2019-11-17 | Disposition: A | Discharge status: Home / Self Care | Payer: Medicare Other | Attending: Emergency Medicine | Admitting: Emergency Medicine

## 2019-11-16 ENCOUNTER — Other Ambulatory Visit: Payer: Self-pay

## 2019-11-16 ENCOUNTER — Encounter (HOSPITAL_COMMUNITY): Payer: Self-pay | Admitting: Emergency Medicine

## 2019-11-16 DIAGNOSIS — F10929 Alcohol use, unspecified with intoxication, unspecified: Secondary | ICD-10-CM | POA: Diagnosis present

## 2019-11-16 DIAGNOSIS — S299XXA Unspecified injury of thorax, initial encounter: Secondary | ICD-10-CM | POA: Diagnosis not present

## 2019-11-16 DIAGNOSIS — E1165 Type 2 diabetes mellitus with hyperglycemia: Secondary | ICD-10-CM | POA: Diagnosis not present

## 2019-11-16 DIAGNOSIS — Y9389 Activity, other specified: Secondary | ICD-10-CM | POA: Diagnosis not present

## 2019-11-16 DIAGNOSIS — R55 Syncope and collapse: Secondary | ICD-10-CM | POA: Insufficient documentation

## 2019-11-16 DIAGNOSIS — S3992XA Unspecified injury of lower back, initial encounter: Secondary | ICD-10-CM | POA: Diagnosis not present

## 2019-11-16 DIAGNOSIS — Y999 Unspecified external cause status: Secondary | ICD-10-CM | POA: Insufficient documentation

## 2019-11-16 DIAGNOSIS — Y929 Unspecified place or not applicable: Secondary | ICD-10-CM | POA: Diagnosis not present

## 2019-11-16 DIAGNOSIS — Y907 Blood alcohol level of 200-239 mg/100 ml: Secondary | ICD-10-CM | POA: Diagnosis not present

## 2019-11-16 DIAGNOSIS — S20229A Contusion of unspecified back wall of thorax, initial encounter: Secondary | ICD-10-CM | POA: Diagnosis not present

## 2019-11-16 DIAGNOSIS — Z87891 Personal history of nicotine dependence: Secondary | ICD-10-CM | POA: Diagnosis not present

## 2019-11-16 DIAGNOSIS — S0990XA Unspecified injury of head, initial encounter: Secondary | ICD-10-CM | POA: Diagnosis not present

## 2019-11-16 DIAGNOSIS — R0902 Hypoxemia: Secondary | ICD-10-CM | POA: Diagnosis not present

## 2019-11-16 DIAGNOSIS — R739 Hyperglycemia, unspecified: Secondary | ICD-10-CM

## 2019-11-16 DIAGNOSIS — Z7982 Long term (current) use of aspirin: Secondary | ICD-10-CM | POA: Insufficient documentation

## 2019-11-16 DIAGNOSIS — S2020XA Contusion of thorax, unspecified, initial encounter: Secondary | ICD-10-CM | POA: Insufficient documentation

## 2019-11-16 DIAGNOSIS — Z743 Need for continuous supervision: Secondary | ICD-10-CM | POA: Diagnosis not present

## 2019-11-16 DIAGNOSIS — F10220 Alcohol dependence with intoxication, uncomplicated: Secondary | ICD-10-CM | POA: Diagnosis not present

## 2019-11-16 DIAGNOSIS — Z79899 Other long term (current) drug therapy: Secondary | ICD-10-CM | POA: Diagnosis not present

## 2019-11-16 DIAGNOSIS — F10920 Alcohol use, unspecified with intoxication, uncomplicated: Secondary | ICD-10-CM

## 2019-11-16 DIAGNOSIS — S199XXA Unspecified injury of neck, initial encounter: Secondary | ICD-10-CM | POA: Diagnosis not present

## 2019-11-16 DIAGNOSIS — I10 Essential (primary) hypertension: Secondary | ICD-10-CM | POA: Insufficient documentation

## 2019-11-16 MED ORDER — VITAMIN B-1 100 MG PO TABS
100.0000 mg | ORAL_TABLET | Freq: Once | ORAL | Status: AC
  Administered 2019-11-17: 100 mg via ORAL
  Filled 2019-11-16: qty 1
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
## 2019-11-17 DIAGNOSIS — S3992XA Unspecified injury of lower back, initial encounter: Secondary | ICD-10-CM | POA: Diagnosis not present

## 2019-11-17 DIAGNOSIS — F10220 Alcohol dependence with intoxication, uncomplicated: Secondary | ICD-10-CM | POA: Diagnosis not present

## 2019-11-17 DIAGNOSIS — S199XXA Unspecified injury of neck, initial encounter: Secondary | ICD-10-CM | POA: Diagnosis not present

## 2019-11-17 DIAGNOSIS — S0990XA Unspecified injury of head, initial encounter: Secondary | ICD-10-CM | POA: Diagnosis not present

## 2019-11-17 DIAGNOSIS — S299XXA Unspecified injury of thorax, initial encounter: Secondary | ICD-10-CM | POA: Diagnosis not present

## 2019-11-17 LAB — COMPREHENSIVE METABOLIC PANEL
ALT: 23 U/L (ref 0–44)
AST: 35 U/L (ref 15–41)
Albumin: 3.7 g/dL (ref 3.5–5.0)
Alkaline Phosphatase: 114 U/L (ref 38–126)
Anion gap: 11 (ref 5–15)
BUN: 12 mg/dL (ref 6–20)
CO2: 22 mmol/L (ref 22–32)
Calcium: 9 mg/dL (ref 8.9–10.3)
Chloride: 104 mmol/L (ref 98–111)
Creatinine, Ser: 0.73 mg/dL (ref 0.61–1.24)
GFR calc Af Amer: >60 mL/min (ref >60)
GFR calc non Af Amer: >60 mL/min (ref >60)
Glucose, Bld: 346 mg/dL — ABNORMAL HIGH (ref 70–99)
Potassium: 3.8 mmol/L (ref 3.5–5.1)
Sodium: 137 mmol/L (ref 135–145)
Total Bilirubin: 0.9 mg/dL (ref 0.3–1.2)
Total Protein: 7.8 g/dL (ref 6.5–8.1)

## 2019-11-17 LAB — CBC WITH DIFFERENTIAL/PLATELET
Abs Immature Granulocytes: 0.01 10*3/uL (ref 0.00–0.07)
Basophils Absolute: 0.1 10*3/uL (ref 0.0–0.1)
Basophils Relative: 2 %
Eosinophils Absolute: 0.1 10*3/uL (ref 0.0–0.5)
Eosinophils Relative: 2 %
HCT: 37 % — ABNORMAL LOW (ref 39.0–52.0)
Hemoglobin: 11.6 g/dL — ABNORMAL LOW (ref 13.0–17.0)
Immature Granulocytes: 0 %
Lymphocytes Relative: 30 %
Lymphs Abs: 1.8 10*3/uL (ref 0.7–4.0)
MCH: 28.6 pg (ref 26.0–34.0)
MCHC: 31.4 g/dL (ref 30.0–36.0)
MCV: 91.1 fL (ref 80.0–100.0)
Monocytes Absolute: 0.5 10*3/uL (ref 0.1–1.0)
Monocytes Relative: 8 %
Neutro Abs: 3.5 10*3/uL (ref 1.7–7.7)
Neutrophils Relative %: 58 %
Platelets: 150 10*3/uL (ref 150–400)
RBC: 4.06 MIL/uL — ABNORMAL LOW (ref 4.22–5.81)
RDW: 14.9 % (ref 11.5–15.5)
WBC: 6.1 10*3/uL (ref 4.0–10.5)
nRBC: 0 % (ref 0.0–0.2)

## 2019-11-17 LAB — MAGNESIUM: Magnesium: 1.9 mg/dL (ref 1.7–2.4)

## 2019-11-17 LAB — CBG MONITORING, ED: Glucose-Capillary: 233 mg/dL — ABNORMAL HIGH (ref 70–99)

## 2019-11-17 LAB — ETHANOL: Alcohol, Ethyl (B): 215 mg/dL — ABNORMAL HIGH (ref <10)

## 2019-11-17 MED ORDER — SODIUM CHLORIDE 0.9 % IV BOLUS
1000.0000 mL | Freq: Once | INTRAVENOUS | Status: AC
  Administered 2019-11-17: 1000 mL via INTRAVENOUS

## 2019-11-17 NOTE — Discharge Instructions (Addendum)
Follow-up with your primary doctor to have your sugars controlled. Check your sugars over the next couple days and take your medicines as directed. Return for new or worsening symptoms. Seek help for alcohol use when you are ready.

## 2019-11-17 NOTE — ED Notes (Signed)
Pt unable to sign due to signature pad not working. Pt given discharge instructions and pt had no questions at this time.
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
## 2019-12-04 ENCOUNTER — Emergency Department (HOSPITAL_COMMUNITY)
Admission: EM | Admit: 2019-12-04 | Discharge: 2019-12-04 | Disposition: A | Discharge status: Home / Self Care | Payer: Medicare Other | Attending: Emergency Medicine | Admitting: Emergency Medicine

## 2019-12-04 ENCOUNTER — Other Ambulatory Visit: Payer: Self-pay

## 2019-12-04 ENCOUNTER — Encounter (HOSPITAL_COMMUNITY): Payer: Self-pay | Admitting: Emergency Medicine

## 2019-12-04 DIAGNOSIS — R0902 Hypoxemia: Secondary | ICD-10-CM | POA: Diagnosis not present

## 2019-12-04 DIAGNOSIS — F10929 Alcohol use, unspecified with intoxication, unspecified: Secondary | ICD-10-CM | POA: Diagnosis not present

## 2019-12-04 DIAGNOSIS — I10 Essential (primary) hypertension: Secondary | ICD-10-CM | POA: Diagnosis not present

## 2019-12-04 DIAGNOSIS — G4489 Other headache syndrome: Secondary | ICD-10-CM | POA: Diagnosis not present

## 2019-12-04 DIAGNOSIS — Z5321 Procedure and treatment not carried out due to patient leaving prior to being seen by health care provider: Secondary | ICD-10-CM | POA: Insufficient documentation

## 2019-12-04 DIAGNOSIS — Z743 Need for continuous supervision: Secondary | ICD-10-CM | POA: Diagnosis not present

## 2019-12-04 DIAGNOSIS — E1165 Type 2 diabetes mellitus with hyperglycemia: Secondary | ICD-10-CM | POA: Diagnosis not present
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
## 2019-12-04 NOTE — ED Notes (Signed)
Called no answer

## 2019-12-04 NOTE — ED Notes (Signed)
Called pt no answer °

## 2019-12-11 ENCOUNTER — Other Ambulatory Visit: Payer: Self-pay | Admitting: *Deleted
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
## 2019-12-12 ENCOUNTER — Other Ambulatory Visit: Payer: Self-pay | Admitting: *Deleted
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
## 2019-12-18 ENCOUNTER — Other Ambulatory Visit: Payer: Self-pay

## 2019-12-18 ENCOUNTER — Other Ambulatory Visit: Payer: Self-pay | Admitting: *Deleted
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
## 2019-12-19 ENCOUNTER — Other Ambulatory Visit: Payer: Self-pay | Admitting: *Deleted
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
## 2019-12-26 ENCOUNTER — Other Ambulatory Visit: Payer: Self-pay | Admitting: *Deleted
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
## 2019-12-27 ENCOUNTER — Ambulatory Visit: Payer: Medicare Other | Admitting: *Deleted

## 2020-01-02 ENCOUNTER — Other Ambulatory Visit: Payer: Self-pay | Admitting: *Deleted

## 2020-01-02 NOTE — Patient Outreach (Signed)
[facility]) Care Management  01/02/2020  Anthony [name] 11-29-1961 [id number]   Call attempt to provide assistance with establishing care with a new primary care provider (PCP)  Outreach attempt unsuccessful to [phone] No answer. THN RN CM left HIPAA compliant voicemail message along with CM's contact info.   Plan: [facility] RN CM scheduled this THN engaged patient for third call attempt within the next 7-14 business days   [place]exter Signor L. Lavina Hamman, RN, BSN, [place] Coordinator Office number [phone] Mobile number [phone]  Main THN number [phone] Fax number [phone]

## 2020-01-02 NOTE — Patient Outreach (Signed)
  [facility]) Care Management  01/02/2020  ZYMEER HEWITT 1961/12/08 [id number]   Care coordination attempt for establishing care with new primary care provider (PCP)   [facility] RN CM reached his nephew, [name]  [facility] RN CM discussed wanting to coordinate another time to speak with [name] and [place] about getting Mr Iwen established with a new family MD Mr Bouwkamp is not present and [name] recommends returning another call    Elsie Baynes L. Lavina Hamman, RN, BSN, [place] Coordinator Office number [phone] Mobile number [phone]  Main THN number [phone] Fax number [phone]

## 2020-01-09 ENCOUNTER — Emergency Department (HOSPITAL_COMMUNITY)
Admission: EM | Admit: 2020-01-09 | Discharge: 2020-01-10 | Disposition: A | Discharge status: Home / Self Care | Payer: Medicare Other | Attending: Emergency Medicine | Admitting: Emergency Medicine

## 2020-01-09 ENCOUNTER — Other Ambulatory Visit: Payer: Self-pay

## 2020-01-09 ENCOUNTER — Encounter (HOSPITAL_COMMUNITY): Payer: Self-pay

## 2020-01-09 DIAGNOSIS — F10220 Alcohol dependence with intoxication, uncomplicated: Secondary | ICD-10-CM | POA: Diagnosis not present

## 2020-01-09 DIAGNOSIS — Y908 Blood alcohol level of 240 mg/100 ml or more: Secondary | ICD-10-CM | POA: Insufficient documentation

## 2020-01-09 DIAGNOSIS — E119 Type 2 diabetes mellitus without complications: Secondary | ICD-10-CM | POA: Insufficient documentation

## 2020-01-09 DIAGNOSIS — Z79899 Other long term (current) drug therapy: Secondary | ICD-10-CM | POA: Insufficient documentation

## 2020-01-09 DIAGNOSIS — I10 Essential (primary) hypertension: Secondary | ICD-10-CM | POA: Insufficient documentation

## 2020-01-09 DIAGNOSIS — F1010 Alcohol abuse, uncomplicated: Secondary | ICD-10-CM

## 2020-01-09 DIAGNOSIS — Z87891 Personal history of nicotine dependence: Secondary | ICD-10-CM | POA: Insufficient documentation

## 2020-01-09 DIAGNOSIS — F10929 Alcohol use, unspecified with intoxication, unspecified: Secondary | ICD-10-CM | POA: Diagnosis present

## 2020-01-09 DIAGNOSIS — F10920 Alcohol use, unspecified with intoxication, uncomplicated: Secondary | ICD-10-CM

## 2020-01-09 LAB — RAPID URINE DRUG SCREEN, HOSP PERFORMED
Amphetamines: NOT DETECTED
Barbiturates: NOT DETECTED
Benzodiazepines: NOT DETECTED
Cocaine: NOT DETECTED
Opiates: NOT DETECTED
Tetrahydrocannabinol: NOT DETECTED

## 2020-01-09 LAB — CBC
HCT: 39.4 % (ref 39.0–52.0)
Hemoglobin: 12.3 g/dL — ABNORMAL LOW (ref 13.0–17.0)
MCH: 26.6 pg (ref 26.0–34.0)
MCHC: 31.2 g/dL (ref 30.0–36.0)
MCV: 85.1 fL (ref 80.0–100.0)
Platelets: 124 10*3/uL — ABNORMAL LOW (ref 150–400)
RBC: 4.63 MIL/uL (ref 4.22–5.81)
RDW: 17 % — ABNORMAL HIGH (ref 11.5–15.5)
WBC: 6.7 10*3/uL (ref 4.0–10.5)
nRBC: 0 % (ref 0.0–0.2)

## 2020-01-09 LAB — COMPREHENSIVE METABOLIC PANEL
ALT: 32 U/L (ref 0–44)
AST: 53 U/L — ABNORMAL HIGH (ref 15–41)
Albumin: 4.2 g/dL (ref 3.5–5.0)
Alkaline Phosphatase: 132 U/L — ABNORMAL HIGH (ref 38–126)
Anion gap: 15 (ref 5–15)
BUN: 13 mg/dL (ref 6–20)
CO2: 21 mmol/L — ABNORMAL LOW (ref 22–32)
Calcium: 9.3 mg/dL (ref 8.9–10.3)
Chloride: 94 mmol/L — ABNORMAL LOW (ref 98–111)
Creatinine, Ser: 0.84 mg/dL (ref 0.61–1.24)
GFR calc Af Amer: >60 mL/min (ref >60)
GFR calc non Af Amer: >60 mL/min (ref >60)
Glucose, Bld: 437 mg/dL — ABNORMAL HIGH (ref 70–99)
Potassium: 3.9 mmol/L (ref 3.5–5.1)
Sodium: 130 mmol/L — ABNORMAL LOW (ref 135–145)
Total Bilirubin: 1.5 mg/dL — ABNORMAL HIGH (ref 0.3–1.2)
Total Protein: 8.7 g/dL — ABNORMAL HIGH (ref 6.5–8.1)

## 2020-01-09 LAB — ETHANOL: Alcohol, Ethyl (B): 337 mg/dL (ref <10)

## 2020-01-09 LAB — SALICYLATE LEVEL: Salicylate Lvl: <7 mg/dL — ABNORMAL LOW (ref 7.0–30.0)

## 2020-01-09 LAB — ACETAMINOPHEN LEVEL: Acetaminophen (Tylenol), Serum: <10 ug/mL — ABNORMAL LOW (ref 10–30)

## 2020-01-09 MED ORDER — CANAGLIFLOZIN 300 MG PO TABS: 300.0000 mg | ORAL_TABLET | Freq: Every day | ORAL | Status: DC

## 2020-01-09 MED ORDER — DICLOFENAC SODIUM 50 MG PO TBEC
50.0000 mg | DELAYED_RELEASE_TABLET | Freq: Two times a day (BID) | ORAL | Status: DC
  Administered 2020-01-10: 50 mg via ORAL
  Filled 2020-01-09 (×7): qty 1

## 2020-01-09 MED ORDER — LACTATED RINGERS IV BOLUS
1000.0000 mL | Freq: Once | INTRAVENOUS | Status: AC
  Administered 2020-01-09: 1000 mL via INTRAVENOUS

## 2020-01-09 MED ORDER — INSULIN ASPART 100 UNIT/ML ~~LOC~~ SOLN
5.0000 [IU] | Freq: Once | SUBCUTANEOUS | Status: AC
  Administered 2020-01-09: 5 [IU] via INTRAVENOUS
  Filled 2020-01-09: qty 1

## 2020-01-09 MED ORDER — GLIPIZIDE ER 5 MG PO TB24
5.0000 mg | ORAL_TABLET | Freq: Every day | ORAL | Status: DC
  Filled 2020-01-09 (×3): qty 1

## 2020-01-09 MED ORDER — CITALOPRAM HYDROBROMIDE 20 MG PO TABS: 20.0000 mg | ORAL_TABLET | Freq: Every day | ORAL | Status: DC

## 2020-01-09 MED ORDER — AMANTADINE HCL 100 MG PO CAPS
100.0000 mg | ORAL_CAPSULE | Freq: Every day | ORAL | Status: DC
  Filled 2020-01-09 (×3): qty 1

## 2020-01-09 MED ORDER — LISINOPRIL 10 MG PO TABS: 10.0000 mg | ORAL_TABLET | Freq: Every day | ORAL | Status: DC

## 2020-01-09 NOTE — ED Notes (Signed)
Date and time results received: 01/09/20 2104 (use smartphrase ".now" to insert current time)  Test: Ethanol Critical Value: 337  Name of Provider Notified: [place]  Orders Received? Or Actions Taken?:

## 2020-01-09 NOTE — ED Triage Notes (Signed)
Pt c/o for alcohol abuse and SI. brought in by uncle.

## 2020-01-10 ENCOUNTER — Other Ambulatory Visit: Payer: Self-pay | Admitting: *Deleted

## 2020-01-10 DIAGNOSIS — F10220 Alcohol dependence with intoxication, uncomplicated: Secondary | ICD-10-CM | POA: Diagnosis not present

## 2020-01-10 LAB — CBG MONITORING, ED: Glucose-Capillary: 183 mg/dL — ABNORMAL HIGH (ref 70–99)

## 2020-01-10 MED ORDER — CHLORDIAZEPOXIDE HCL 25 MG PO CAPS
25.0000 mg | ORAL_CAPSULE | Freq: Once | ORAL | Status: AC
  Administered 2020-01-10: 08:00:00 25 mg via ORAL
  Filled 2020-01-10: qty 1

## 2020-01-10 MED ORDER — CHLORDIAZEPOXIDE HCL 25 MG PO CAPS: ORAL_CAPSULE | ORAL | 0 refills | Status: DC

## 2020-01-10 NOTE — ED Notes (Signed)
Pt guardian Ramon Dredge [phone] requesting to be contacted by telepsych counselor.

## 2020-01-10 NOTE — Patient Outreach (Signed)
[place] [facility]) Care Management  01/10/2020  KOLTIN MUZIO [birth year] [id number]   Care coordination  Mr Lalor is scheduled for the [facility] engaged patient third call attempt today 01/10/20 at 0900 but with review of Epic, [facility] RN CM notes an [facility] ED visit for alcohol intoxication on 01/09/20. This is the 4th ED visit in the last 6 months.   Contact was made with the previous ED MD, [place]. Dr Dayna Barker confirms patient continues to want to stop drinking. Pt started on librium 25 mg taper and given outpatient resources prior to discharge   [facility] RN CM updated [place]. Lavina Hamman, RN, BSN, [place] Coordinator Office number [phone] Mobile number [phone]  Main THN number [phone] Fax number [phone]
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
## 2020-01-12 ENCOUNTER — Encounter (HOSPITAL_COMMUNITY): Payer: Self-pay | Admitting: Emergency Medicine

## 2020-01-12 ENCOUNTER — Emergency Department (HOSPITAL_COMMUNITY)
Admission: EM | Admit: 2020-01-12 | Discharge: 2020-01-13 | Disposition: A | Discharge status: Other Acute Inpatient Hospital | Payer: Medicare Other | Attending: Emergency Medicine | Admitting: Emergency Medicine

## 2020-01-12 ENCOUNTER — Other Ambulatory Visit: Payer: Self-pay

## 2020-01-12 DIAGNOSIS — Y939 Activity, unspecified: Secondary | ICD-10-CM | POA: Diagnosis not present

## 2020-01-12 DIAGNOSIS — F1020 Alcohol dependence, uncomplicated: Secondary | ICD-10-CM | POA: Diagnosis not present

## 2020-01-12 DIAGNOSIS — S0003XA Contusion of scalp, initial encounter: Secondary | ICD-10-CM | POA: Diagnosis not present

## 2020-01-12 DIAGNOSIS — Z743 Need for continuous supervision: Secondary | ICD-10-CM | POA: Diagnosis not present

## 2020-01-12 DIAGNOSIS — Y92002 Bathroom of unspecified non-institutional (private) residence single-family (private) house as the place of occurrence of the external cause: Secondary | ICD-10-CM | POA: Diagnosis not present

## 2020-01-12 DIAGNOSIS — Z79899 Other long term (current) drug therapy: Secondary | ICD-10-CM | POA: Insufficient documentation

## 2020-01-12 DIAGNOSIS — Z20822 Contact with and (suspected) exposure to covid-19: Secondary | ICD-10-CM | POA: Diagnosis not present

## 2020-01-12 DIAGNOSIS — R739 Hyperglycemia, unspecified: Secondary | ICD-10-CM

## 2020-01-12 DIAGNOSIS — Z7984 Long term (current) use of oral hypoglycemic drugs: Secondary | ICD-10-CM | POA: Insufficient documentation

## 2020-01-12 DIAGNOSIS — W01198A Fall on same level from slipping, tripping and stumbling with subsequent striking against other object, initial encounter: Secondary | ICD-10-CM | POA: Insufficient documentation

## 2020-01-12 DIAGNOSIS — Z046 Encounter for general psychiatric examination, requested by authority: Secondary | ICD-10-CM | POA: Insufficient documentation

## 2020-01-12 DIAGNOSIS — E1165 Type 2 diabetes mellitus with hyperglycemia: Secondary | ICD-10-CM | POA: Diagnosis not present

## 2020-01-12 DIAGNOSIS — Y999 Unspecified external cause status: Secondary | ICD-10-CM | POA: Diagnosis not present

## 2020-01-12 DIAGNOSIS — R45851 Suicidal ideations: Secondary | ICD-10-CM

## 2020-01-12 DIAGNOSIS — Z87891 Personal history of nicotine dependence: Secondary | ICD-10-CM | POA: Insufficient documentation

## 2020-01-12 DIAGNOSIS — W19XXXA Unspecified fall, initial encounter: Secondary | ICD-10-CM

## 2020-01-12 DIAGNOSIS — I10 Essential (primary) hypertension: Secondary | ICD-10-CM | POA: Diagnosis not present

## 2020-01-12 DIAGNOSIS — R58 Hemorrhage, not elsewhere classified: Secondary | ICD-10-CM | POA: Diagnosis not present

## 2020-01-12 DIAGNOSIS — S0101XA Laceration without foreign body of scalp, initial encounter: Secondary | ICD-10-CM | POA: Insufficient documentation

## 2020-01-12 DIAGNOSIS — S0990XA Unspecified injury of head, initial encounter: Secondary | ICD-10-CM

## 2020-01-12 DIAGNOSIS — F333 Major depressive disorder, recurrent, severe with psychotic symptoms: Secondary | ICD-10-CM | POA: Insufficient documentation

## 2020-01-12 DIAGNOSIS — R0689 Other abnormalities of breathing: Secondary | ICD-10-CM | POA: Diagnosis not present

## 2020-01-12 DIAGNOSIS — R000 Tachycardia, unspecified: Secondary | ICD-10-CM | POA: Diagnosis not present

## 2020-01-12 DIAGNOSIS — Y907 Blood alcohol level of 200-239 mg/100 ml: Secondary | ICD-10-CM | POA: Diagnosis not present

## 2020-01-12 DIAGNOSIS — F1010 Alcohol abuse, uncomplicated: Secondary | ICD-10-CM

## 2020-01-12 DIAGNOSIS — I959 Hypotension, unspecified: Secondary | ICD-10-CM | POA: Diagnosis not present

## 2020-01-12 DIAGNOSIS — Z9114 Patient's other noncompliance with medication regimen: Secondary | ICD-10-CM | POA: Insufficient documentation

## 2020-01-12 DIAGNOSIS — D62 Acute posthemorrhagic anemia: Secondary | ICD-10-CM | POA: Diagnosis not present

## 2020-01-12 LAB — CBG MONITORING, ED: Glucose-Capillary: 451 mg/dL — ABNORMAL HIGH (ref 70–99)

## 2020-01-12 MED ORDER — LIDOCAINE-EPINEPHRINE (PF) 2 %-1:200000 IJ SOLN
INTRAMUSCULAR | Status: AC
  Filled 2020-01-12: qty 20

## 2020-01-12 MED ORDER — THIAMINE HCL 100 MG/ML IJ SOLN
100.0000 mg | Freq: Once | INTRAMUSCULAR | Status: AC
  Administered 2020-01-13: 100 mg via INTRAVENOUS
  Filled 2020-01-12: qty 2

## 2020-01-12 MED ORDER — LIDOCAINE-EPINEPHRINE (PF) 2 %-1:200000 IJ SOLN
10.0000 mL | Freq: Once | INTRAMUSCULAR | Status: AC
  Administered 2020-01-12: 10 mL via INTRADERMAL

## 2020-01-12 MED ORDER — INSULIN ASPART 100 UNIT/ML ~~LOC~~ SOLN
6.0000 [IU] | Freq: Once | SUBCUTANEOUS | Status: AC
  Administered 2020-01-13: 6 [IU] via INTRAVENOUS

## 2020-01-12 MED ORDER — SODIUM CHLORIDE 0.9 % IV BOLUS (SEPSIS)
1000.0000 mL | Freq: Once | INTRAVENOUS | Status: AC
  Administered 2020-01-13: 1000 mL via INTRAVENOUS

## 2020-01-12 NOTE — ED Provider Notes (Addendum)
TIME SEEN: 11:06 PM  CHIEF COMPLAINT: Fall, head injury  HPI: Patient is a 59 year old male with history of alcohol abuse, diabetes, hypertension, hyperlipidemia who presents to the emergency department after he fell in his bathroom striking the back of his head on his sink.  He states he has been drinking a lot of alcohol today.  He states this is why he fell.  No lightheadedness, chest pain, shortness of breath, palpitations, numbness or weakness that led to his fall.  Has been in his normal state of health until this happened tonight.  He is on aspirin but no anticoagulants.  He is unsure if he lost consciousness.  States his last tetanus vaccination was in the past year.  Denies any neck or back pain.  No other injury.  No nausea or vomiting.  States he has been drinking heavily for the past 20 years.  States he drinks every day.  No history of withdrawal seizure.  Blood sugar found in the 500s with EMS.  He states he has been off of all of his medications for the past 6 months.  States he has not seen his doctor to get refills.  He does have insurance.  States his PCP is Dr. [name].  ROS: See HPI Constitutional: no fever  Eyes: no drainage  ENT: no runny nose   Cardiovascular:  no chest pain  Resp: no SOB  GI: no vomiting GU: no dysuria Integumentary: no rash  Allergy: no hives  Musculoskeletal: no leg swelling  Neurological: no slurred speech ROS otherwise negative  PAST MEDICAL HISTORY/PAST SURGICAL HISTORY:  Past Medical History:  Diagnosis Date  . ADD (attention deficit disorder with hyperactivity)   . Alcohol abuse   . Alcohol dependence ([place])   . Anxiety   . Diabetes mellitus   . DM (diabetes mellitus), type 2 with complications ([place]) A999333  . ETOH abuse   . GERD (gastroesophageal reflux disease)   . Hx of colonic polyp - sessile serrated polyp 12/07/2014  . Hyperlipidemia   . Hypertension     MEDICATIONS:  Prior to Admission medications   Medication Sig  Start Date End Date Taking? Authorizing Provider  ACCU-CHEK AVIVA PLUS test strip USE TO TEST BLOOD SUGAR DAILY. 06/03/18   Marletta Lor, MD  ACCU-CHEK SOFTCLIX LANCETS lancets USE TO TEST BLOOD SUGAR DAILY. 08/14/16   Marletta Lor, MD  acetaminophen (TYLENOL) 325 MG tablet Take 650 mg by mouth every 6 (six) hours as needed for moderate pain.    [provider]  amantadine (SYMMETREL) 100 MG capsule Take 100 mg by mouth daily.  03/14/18   [provider]  aspirin 81 MG tablet Take 81 mg by mouth daily as needed for pain.    [provider]  atorvastatin (LIPITOR) 40 MG tablet Take 1 tablet (40 mg total) by mouth daily. 01/06/19   Martinique, Betty G, MD  canagliflozin (INVOKANA) 300 MG TABS tablet Take by mouth. 03/22/17   [provider]  chlordiazePOXIDE (LIBRIUM) 25 MG capsule 50mg  PO TID x 2D, then 25-50mg  PO BID X 2D, then 25-50mg  PO QD X 1D 01/10/20   Mesner, Corene Cornea, MD  citalopram (CELEXA) 20 MG tablet Take 1 tablet (20 mg total) by mouth daily. 01/06/19   Martinique, Betty G, MD  clotrimazole (LOTRIMIN) 1 % cream APPLY TO AFFECTED AREA(S) TOPICALLY TWICE DAILY. 06/03/18   Marletta Lor, MD  diclofenac (VOLTAREN) 50 MG EC tablet Take 1 tablet (50 mg total) by mouth 2 (  two) times daily. 10/15/18   [name], MD  fluticasone [facility]) 50 MCG/ACT nasal spray USE TWO SPRAYS IN BOTH NOSTRIL DAILY 03/03/17   [provider]  glipiZIDE (GLUCOTROL XL) 5 MG 24 hr tablet TAKE ONE (1) TABLET EACH DAY 03/03/17   [provider]  ibuprofen (ADVIL,MOTRIN) 800 MG tablet Take 1 tablet (800 mg total) by mouth every 8 (eight) hours as needed. 09/05/18   Long, Wonda Olds, MD  lidocaine (LIDODERM) 5 % Place 1 patch onto the skin daily. Remove & Discard patch within 12 hours or as directed by MD. Apply to the painful area. 09/05/18   Long, Wonda Olds, MD  lisinopril (PRINIVIL,ZESTRIL) 10 MG tablet Take 1 tablet (10 mg total) by mouth daily. 01/06/19   Martinique,  Betty G, MD  Multiple Vitamin (MULTIVITAMIN WITH MINERALS) TABS tablet Take 1 tablet by mouth daily. 12/18/15   Isaac Bliss, Rayford Halsted, MD  topiramate (TOPAMAX) 50 MG tablet Take 1 tablet (50 mg total) by mouth 2 (two) times daily. 04/21/18 11/18/18  Marletta Lor, MD    ALLERGIES:  No Known Allergies  SOCIAL HISTORY:  Social History   Tobacco Use  . Smoking status: Former Smoker    Quit date: 08/24/1996    Years since quitting: 23.4  . Smokeless tobacco: Never Used  Substance Use Topics  . Alcohol use: Yes    Alcohol/week: 0.0 standard drinks    Comment: 1 case a day     FAMILY HISTORY: Family History  Problem Relation Age of Onset  . Brain cancer Mother   . Diabetes Father   . Diabetes Sister   . Stroke Brother   . Seizures Brother   . Colon cancer Neg Hx   . Esophageal cancer Neg Hx   . Rectal cancer Neg Hx   . Stomach cancer Neg Hx     EXAM: BP 110/76 (BP Location: Right Arm)   Pulse 100   Temp 97.7 F (36.5 C) (Oral)   Resp 17   Ht 5\' 10"  (1.778 m)   Wt 81.6 kg   SpO2 95%   BMI 25.83 kg/m  CONSTITUTIONAL: Alert and oriented and responds appropriately to questions.  Chronically ill-appearing.  In no distress.  Intoxicated. HEAD: Normocephalic; patient has a hematoma to the posterior scalp and approximately 12 cm vertical laceration to the posterior scalp that has small pieces of what appear to be porcelain in the wound EYES: Conjunctivae clear, PERRL, EOMI ENT: normal nose; no rhinorrhea; moist mucous membranes; pharynx without lesions noted; no dental injury; no septal hematoma NECK: Supple, no meningismus, no LAD; no midline spinal tenderness, step-off or deformity; trachea midline CARD: RRR; S1 and S2 appreciated; no murmurs, no clicks, no rubs, no gallops RESP: Normal chest excursion without splinting or tachypnea; breath sounds clear and equal bilaterally; no wheezes, no rhonchi, no rales; no hypoxia or respiratory distress CHEST:  chest wall  stable, no crepitus or ecchymosis or deformity, nontender to palpation; no flail chest ABD/GI: Normal bowel sounds; non-distended; soft, non-tender, no rebound, no guarding; no ecchymosis or other lesions noted PELVIS:  stable, nontender to palpation BACK:  The back appears normal and is non-tender to palpation, there is no CVA tenderness; no midline spinal tenderness, step-off or deformity EXT: Normal ROM in all joints; non-tender to palpation; no edema; normal capillary refill; no cyanosis, no bony tenderness or bony deformity of patient's extremities, no joint effusion, compartments are soft, extremities are warm and well-perfused, no ecchymosis SKIN: Normal color for  age and race; warm NEURO: Moves all extremities equally, reports normal sensation diffusely, cranial nerves II through XII intact, normal speech PSYCH: The patient's mood and manner are appropriate. Grooming and personal hygiene are appropriate.  MEDICAL DECISION MAKING: Patient here after he had a fall in his bathroom while being intoxicated.  Has large scalp laceration that has what appears to be small foreign bodies of porcelain likely from his sink.  Does not look like skull fragments but will obtain head CT.  Wound has been irrigated copiously and repaired with 11 staples.  He states his tetanus vaccination is up-to-date.  No preceding symptoms that led to his fall.  No focal neurologic deficits.  Will also obtain CT of the cervical spine given he is intoxicated.  Patient also reports he has been off of all of his medications for 6 months.  He has a history of hypertension but currently has blood pressures in the 110's/70s.  His blood glucose however is elevated at 451.  Will give IV fluids and IV insulin.  Will check to ensure patient is not in DKA.  Patient cannot remember what medication he used to take for his diabetes but states it was a pill and not insulin.    Patient will need to be monitored until clinically sober.  Of  note, patient mention to nursing staff that he had thoughts of wanting to harm himself.  He does not mention this to me currently but will be reassessed when clinically sober.  Will give IV thiamine here in the ED.  Will monitor for signs of withdrawal.  He denies history of DTs, withdrawal seizures.  ED PROGRESS: 3:15 AM  Patient's labs show bicarb of 20 but normal anion gap and no ketones in his urine.  He is not in DKA.  Blood glucose slowly improving.  We will continue hydration and IV insulin.  Alcohol level of 228.  Will place him on a CIWA protocol.  Patient seems clinically sober at this time and is endorsing SI without plan.  Will consult TTS.  Nursing staff also talked to his family who seemed very concerned about his alcohol abuse and his suicidal thoughts.  4:40 AM  Pt's hemoglobin has dropped down to 9.9 from 11.2.  Likely due to bleeding from the scalp.  He is no longer actively bleeding.  Does not need a transfusion.  Hemodynamically stable.  Blood glucose down to 325.  I have reordered his home Metformin as well as his other home medications.  TTS consult is pending.  If discharged home, patient's medications have all been reordered and sent to his pharmacy.   5:45 AM  TTS recommends inpatient treatment and I agree.  Patient agrees to stay voluntarily.  They are working on placement.  He is medically cleared at this time.  Patient agrees to stay voluntarily.  Spoke with [employer] [phone] patient's niece.  She has been updated with plan of care.  Family is very concerned that he will leave [facility] once he is clinically sober.  She states that he has done this before.  I have placed patient under involuntary commitment.  6:18 AM  Pt is Covid negative.   I reviewed all nursing notes and pertinent previous records as available.  I have interpreted any EKGs, lab and urine results, imaging (as available).  LACERATION REPAIR Performed by: Pryor Curia Authorized by: Pryor Curia Consent: Verbal consent obtained. Risks and benefits: risks, benefits and alternatives were discussed Consent given by:  patient Patient identity confirmed: provided demographic data Prepped and Draped in normal sterile fashion Wound explored  Laceration Location: Posterior scalp  Laceration Length: 12 cm  No Foreign Bodies seen or palpated  Anesthesia: local infiltration  Local anesthetic: lidocaine 2% with epinephrine  Anesthetic total: 8 ml  Irrigation method: syringe Amount of cleaning: standard  Skin closure: Superficial with staples  Number of sutures: 11 staples  Technique: Wound irrigated copiously with a liter of saline.  No residual foreign body appreciated.  Area cleaned with Betadine and wound approximated with 11 staples.  Wound hemostatic after being closed.  Will apply sterile dressing.  Patient tolerance: Patient tolerated the procedure well with no immediate complications.   CRITICAL CARE Performed by: Pryor Curia   Total critical care time: 50 minutes  Critical care time was exclusive of separately billable procedures and treating other patients.  Critical care was necessary to treat or prevent imminent or life-threatening deterioration.  Critical care was time spent personally by me on the following activities: development of treatment plan with patient and/or surrogate as well as nursing, discussions with consultants, evaluation of patient's response to treatment, examination of patient, obtaining history from patient or surrogate, ordering and performing treatments and interventions, ordering and review of laboratory studies, ordering and review of radiographic studies, pulse oximetry and re-evaluation of patient's condition.  THALMUS BELOTTI was evaluated in Emergency Department on 01/12/2020 for the symptoms described in the history of present illness. He was evaluated in the context of the global COVID-19 pandemic,  which necessitated consideration that the patient might be at risk for infection with the SARS-CoV-2 virus that causes COVID-19. Institutional protocols and algorithms that pertain to the evaluation of patients at risk for COVID-19 are in a state of rapid change based on information released by regulatory bodies including the CDC and federal and state organizations. These policies and algorithms were followed during the patient's care in the ED.  Patient was seen wearing N95, face shield, gloves.          Tatanisha Cuthbert, Delice Bison, DO 01/13/20 [phone]

## 2020-01-12 NOTE — ED Triage Notes (Addendum)
Pt coming from home after falling in the bathroom today and hitting head on the sink. Hx of some MR but can answer questions and is able to communicate clearly. Pt states he has been feeling depressed and wanted to hurt himself recently, but this tonight was not intentenal, per pt. Unclear if pt had LOC. Pt has approx 4 in lac to back of left side of head that is bleeding and oozing. Lost approx 276ml of blood per EMS. Pt endorses drinking vodka today, but doesn't know how much. Blood sugar in the 500s with EMS, pt received 558ml bolus

## 2020-01-12 NOTE — ED Notes (Signed)
Staples in head placed by MD. Pt's head wrapped with dressing. Bleeding under control now. Will continue to monitor

## 2020-01-12 NOTE — ED Notes (Signed)
[employer] [facility] [phone], call for medical info.

## 2020-01-13 ENCOUNTER — Emergency Department (HOSPITAL_COMMUNITY): Payer: Medicare Other

## 2020-01-13 ENCOUNTER — Encounter (HOSPITAL_COMMUNITY): Payer: Self-pay | Admitting: *Deleted

## 2020-01-13 DIAGNOSIS — S0003XA Contusion of scalp, initial encounter: Secondary | ICD-10-CM | POA: Diagnosis not present

## 2020-01-13 DIAGNOSIS — S0990XA Unspecified injury of head, initial encounter: Secondary | ICD-10-CM | POA: Diagnosis not present

## 2020-01-13 DIAGNOSIS — S0101XA Laceration without foreign body of scalp, initial encounter: Secondary | ICD-10-CM | POA: Diagnosis not present

## 2020-01-13 DIAGNOSIS — D62 Acute posthemorrhagic anemia: Secondary | ICD-10-CM | POA: Diagnosis not present

## 2020-01-13 DIAGNOSIS — R000 Tachycardia, unspecified: Secondary | ICD-10-CM | POA: Diagnosis not present

## 2020-01-13 LAB — URINALYSIS, ROUTINE W REFLEX MICROSCOPIC
Bacteria, UA: NONE SEEN
Bilirubin Urine: NEGATIVE
Glucose, UA: >=500 mg/dL — AB
Hgb urine dipstick: NEGATIVE
Ketones, ur: NEGATIVE mg/dL
Leukocytes,Ua: NEGATIVE
Nitrite: NEGATIVE
Protein, ur: NEGATIVE mg/dL
Specific Gravity, Urine: 1.02 (ref 1.005–1.030)
pH: 6 (ref 5.0–8.0)

## 2020-01-13 LAB — COMPREHENSIVE METABOLIC PANEL
ALT: 28 U/L (ref 0–44)
AST: 48 U/L — ABNORMAL HIGH (ref 15–41)
Albumin: 3.7 g/dL (ref 3.5–5.0)
Alkaline Phosphatase: 131 U/L — ABNORMAL HIGH (ref 38–126)
Anion gap: 15 (ref 5–15)
BUN: 15 mg/dL (ref 6–20)
CO2: 20 mmol/L — ABNORMAL LOW (ref 22–32)
Calcium: 8.9 mg/dL (ref 8.9–10.3)
Chloride: 102 mmol/L (ref 98–111)
Creatinine, Ser: 0.94 mg/dL (ref 0.61–1.24)
GFR calc Af Amer: >60 mL/min (ref >60)
GFR calc non Af Amer: >60 mL/min (ref >60)
Glucose, Bld: 497 mg/dL — ABNORMAL HIGH (ref 70–99)
Potassium: 4.1 mmol/L (ref 3.5–5.1)
Sodium: 137 mmol/L (ref 135–145)
Total Bilirubin: 1.2 mg/dL (ref 0.3–1.2)
Total Protein: 7.1 g/dL (ref 6.5–8.1)

## 2020-01-13 LAB — TYPE AND SCREEN
ABO/RH(D): O NEG
Antibody Screen: NEGATIVE

## 2020-01-13 LAB — CBC WITH DIFFERENTIAL/PLATELET
Abs Immature Granulocytes: 0.01 10*3/uL (ref 0.00–0.07)
Basophils Absolute: 0.1 10*3/uL (ref 0.0–0.1)
Basophils Relative: 2 %
Eosinophils Absolute: 0.1 10*3/uL (ref 0.0–0.5)
Eosinophils Relative: 2 %
HCT: 36.5 % — ABNORMAL LOW (ref 39.0–52.0)
Hemoglobin: 11.2 g/dL — ABNORMAL LOW (ref 13.0–17.0)
Immature Granulocytes: 0 %
Lymphocytes Relative: 28 %
Lymphs Abs: 1.3 10*3/uL (ref 0.7–4.0)
MCH: 27 pg (ref 26.0–34.0)
MCHC: 30.7 g/dL (ref 30.0–36.0)
MCV: 88 fL (ref 80.0–100.0)
Monocytes Absolute: 0.4 10*3/uL (ref 0.1–1.0)
Monocytes Relative: 8 %
Neutro Abs: 2.9 10*3/uL (ref 1.7–7.7)
Neutrophils Relative %: 60 %
Platelets: 127 10*3/uL — ABNORMAL LOW (ref 150–400)
RBC: 4.15 MIL/uL — ABNORMAL LOW (ref 4.22–5.81)
RDW: 17 % — ABNORMAL HIGH (ref 11.5–15.5)
WBC: 4.7 10*3/uL (ref 4.0–10.5)
nRBC: 0 % (ref 0.0–0.2)

## 2020-01-13 LAB — HEMOGLOBIN AND HEMATOCRIT, BLOOD
HCT: 32.3 % — ABNORMAL LOW (ref 39.0–52.0)
Hemoglobin: 9.9 g/dL — ABNORMAL LOW (ref 13.0–17.0)

## 2020-01-13 LAB — RAPID URINE DRUG SCREEN, HOSP PERFORMED
Amphetamines: NOT DETECTED
Barbiturates: NOT DETECTED
Benzodiazepines: NOT DETECTED
Cocaine: NOT DETECTED
Opiates: NOT DETECTED
Tetrahydrocannabinol: NOT DETECTED

## 2020-01-13 LAB — PROTIME-INR
INR: 1.1 (ref 0.8–1.2)
Prothrombin Time: 14.3 seconds (ref 11.4–15.2)

## 2020-01-13 LAB — RESPIRATORY PANEL BY RT PCR (FLU A&B, COVID)
Influenza A by PCR: NEGATIVE
Influenza B by PCR: NEGATIVE
SARS Coronavirus 2 by RT PCR: NEGATIVE

## 2020-01-13 LAB — ETHANOL: Alcohol, Ethyl (B): 228 mg/dL — ABNORMAL HIGH (ref <10)

## 2020-01-13 LAB — CBG MONITORING, ED
Glucose-Capillary: 398 mg/dL — ABNORMAL HIGH (ref 70–99)
Glucose-Capillary: 325 mg/dL — ABNORMAL HIGH (ref 70–99)
Glucose-Capillary: 317 mg/dL — ABNORMAL HIGH (ref 70–99)
Glucose-Capillary: 300 mg/dL — ABNORMAL HIGH (ref 70–99)

## 2020-01-13 LAB — ABO/RH: ABO/RH(D): O NEG

## 2020-01-13 MED ORDER — METFORMIN HCL 1000 MG PO TABS: 1000.0000 mg | ORAL_TABLET | Freq: Two times a day (BID) | ORAL | 0 refills | Status: AC

## 2020-01-13 MED ORDER — LISINOPRIL 10 MG PO TABS: 10.0000 mg | ORAL_TABLET | Freq: Every day | ORAL | 0 refills | Status: DC

## 2020-01-13 MED ORDER — ATORVASTATIN CALCIUM 40 MG PO TABS: 40.0000 mg | ORAL_TABLET | Freq: Every day | ORAL | 0 refills | Status: AC

## 2020-01-13 MED ORDER — LORAZEPAM 1 MG PO TABS: 0.0000 mg | ORAL_TABLET | Freq: Four times a day (QID) | ORAL | Status: DC

## 2020-01-13 MED ORDER — CITALOPRAM HYDROBROMIDE 10 MG PO TABS
20.0000 mg | ORAL_TABLET | Freq: Every day | ORAL | Status: DC
  Administered 2020-01-13: 20 mg via ORAL
  Filled 2020-01-13: qty 2

## 2020-01-13 MED ORDER — INSULIN ASPART 100 UNIT/ML ~~LOC~~ SOLN
6.0000 [IU] | Freq: Once | SUBCUTANEOUS | Status: AC
  Administered 2020-01-13: 6 [IU] via INTRAVENOUS

## 2020-01-13 MED ORDER — LORAZEPAM 2 MG/ML IJ SOLN: 0.0000 mg | Freq: Four times a day (QID) | INTRAMUSCULAR | Status: DC

## 2020-01-13 MED ORDER — GABAPENTIN 300 MG PO CAPS: 300.0000 mg | ORAL_CAPSULE | Freq: Two times a day (BID) | ORAL | 0 refills | Status: DC

## 2020-01-13 MED ORDER — ONE-A-DAY MENS PO TABS: 1.0000 | ORAL_TABLET | Freq: Every day | ORAL | 0 refills | Status: AC

## 2020-01-13 MED ORDER — CITALOPRAM HYDROBROMIDE 20 MG PO TABS: 20.0000 mg | ORAL_TABLET | Freq: Every day | ORAL | 0 refills | Status: AC

## 2020-01-13 MED ORDER — ADULT MULTIVITAMIN W/MINERALS CH: 1.0000 | ORAL_TABLET | Freq: Every day | ORAL | Status: DC

## 2020-01-13 MED ORDER — THIAMINE HCL 100 MG/ML IJ SOLN: 100.0000 mg | Freq: Every day | INTRAMUSCULAR | Status: DC

## 2020-01-13 MED ORDER — ATORVASTATIN CALCIUM 40 MG PO TABS
40.0000 mg | ORAL_TABLET | Freq: Every day | ORAL | Status: DC
  Administered 2020-01-13: 40 mg via ORAL
  Filled 2020-01-13: qty 1

## 2020-01-13 MED ORDER — ACETAMINOPHEN 500 MG PO TABS
1000.0000 mg | ORAL_TABLET | Freq: Once | ORAL | Status: AC
  Administered 2020-01-13: 1000 mg via ORAL
  Filled 2020-01-13: qty 2

## 2020-01-13 MED ORDER — THIAMINE HCL 100 MG PO TABS
100.0000 mg | ORAL_TABLET | Freq: Every day | ORAL | Status: DC
  Administered 2020-01-13: 100 mg via ORAL
  Filled 2020-01-13: qty 1

## 2020-01-13 MED ORDER — ACETAMINOPHEN 325 MG PO TABS
650.0000 mg | ORAL_TABLET | Freq: Once | ORAL | Status: AC
  Administered 2020-01-13: 650 mg via ORAL
  Filled 2020-01-13: qty 2

## 2020-01-13 MED ORDER — THIAMINE HCL 100 MG PO TABS: 100.0000 mg | ORAL_TABLET | Freq: Every day | ORAL | 0 refills | Status: AC

## 2020-01-13 MED ORDER — METFORMIN HCL 500 MG PO TABS
1000.0000 mg | ORAL_TABLET | Freq: Two times a day (BID) | ORAL | Status: DC
  Administered 2020-01-13 (×2): 1000 mg via ORAL
  Filled 2020-01-13 (×2): qty 2

## 2020-01-13 MED ORDER — GABAPENTIN 300 MG PO CAPS
300.0000 mg | ORAL_CAPSULE | Freq: Two times a day (BID) | ORAL | Status: DC
  Administered 2020-01-13 (×2): 300 mg via ORAL
  Filled 2020-01-13 (×2): qty 1

## 2020-01-13 MED ORDER — INSULIN ASPART 100 UNIT/ML ~~LOC~~ SOLN
0.0000 [IU] | Freq: Three times a day (TID) | SUBCUTANEOUS | Status: DC
  Administered 2020-01-13: 8 [IU] via SUBCUTANEOUS

## 2020-01-13 MED ORDER — FOLIC ACID 1 MG PO TABS: 1.0000 mg | ORAL_TABLET | Freq: Every day | ORAL | 0 refills | Status: AC

## 2020-01-13 MED ORDER — LORAZEPAM 1 MG PO TABS: 0.0000 mg | ORAL_TABLET | Freq: Two times a day (BID) | ORAL | Status: DC

## 2020-01-13 MED ORDER — LORAZEPAM 2 MG/ML IJ SOLN: 0.0000 mg | Freq: Two times a day (BID) | INTRAMUSCULAR | Status: DC

## 2020-01-13 MED ORDER — INSULIN ASPART 100 UNIT/ML ~~LOC~~ SOLN
5.0000 [IU] | Freq: Once | SUBCUTANEOUS | Status: AC
  Administered 2020-01-13: 5 [IU] via INTRAVENOUS

## 2020-01-13 MED ORDER — LISINOPRIL 10 MG PO TABS
10.0000 mg | ORAL_TABLET | Freq: Every day | ORAL | Status: DC
  Administered 2020-01-13: 10 mg via ORAL
  Filled 2020-01-13: qty 1

## 2020-01-13 MED ORDER — INSULIN ASPART 100 UNIT/ML ~~LOC~~ SOLN: 0.0000 [IU] | Freq: Every day | SUBCUTANEOUS | Status: DC

## 2020-01-13 NOTE — ED Notes (Signed)
Pt transported to CT ?

## 2020-01-13 NOTE — ED Notes (Signed)
Breakfast ordered 

## 2020-01-13 NOTE — ED Notes (Signed)
Updated Museum/gallery conservator, family member of pt's plan of care

## 2020-01-13 NOTE — Progress Notes (Signed)
Pt is under review at [place] faxed updated labs and CT scan results.   Darletta Moll MSW, [place] Worker Disposition  [facility] Ph: [phone] Fax: [phone]

## 2020-01-13 NOTE — ED Notes (Signed)
Belongings in locker #2 in purple zone.

## 2020-01-13 NOTE — ED Provider Notes (Signed)
  Physical Exam  BP 116/76   Pulse (!) 104   Temp 97.7 F (36.5 C) (Oral)   Resp 16   Ht 5\' 10"  (1.778 m)   Wt 81.6 kg   SpO2 96%   BMI 25.83 kg/m   Physical Exam  ED Course/Procedures     Procedures  MDM  Accepted at [facility] by Dr. Orma Render.  Reevaluated by me.       Davonna Belling, MD 01/13/20 1409

## 2020-01-13 NOTE — Progress Notes (Signed)
Pt accepted to [facility] Dr. Eileen Stanford is the accepting provider.  Call report to [phone] Jacqlyn Larsen, RN @ [facility] ED notified.   Pt is IVC  Pt may be transported by [employer] Pt scheduled to arrive once transportation has been set up.   Darletta Moll MSW, [place] Worker Disposition  [facility] Ph: [phone] Fax: [phone]  01/13/2020 1:19 PM

## 2020-01-13 NOTE — Progress Notes (Signed)
Patient meets criteria for inpatient treatment per Lindon Romp, FNP. No appropriate beds at [facility] currently. CSW faxed referrals to the following facilities for review:  [facility]   [facility]   [facility]    TTS will continue to seek bed placement.     Darletta Moll MSW, [place] Worker Disposition  [facility] Ph: [phone] Fax: [phone] 01/13/2020 10:46 AM

## 2020-01-13 NOTE — ED Notes (Signed)
Attempted to call staffing to obtain sitter. No sitters available currently for pt.

## 2020-01-13 NOTE — ED Notes (Addendum)
Pt arrived to [facility] - wearing burgundy scrubs. Pt noted w/dressing to head - clean and intact. Sitter w/pt. Pt aware and voiced agreement w/tx plan - Accepted to [facility]. Pt also aware a male has called asking for him - pt is listed as confidential. Pt states the person is probably Museum/gallery conservator. Pt voiced permission to be give to [employer] and [employer]. Offered for pt to call them - states he does not have their phone numbers. Pt given phone number of Ramon Dredge listed in chart. Pt advised he will call after he eats lunch. Left message for [facility] Deputy notifying of need for transport.

## 2020-01-13 NOTE — ED Notes (Signed)
Deputy transporting pt to [facility] - IVC paperwork given to Deputy - ALL belongings - 1 labeled belongings bag - Deputy - Pt aware.

## 2020-01-13 NOTE — ED Notes (Signed)
Lunch tray ordered 
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
## 2020-01-13 NOTE — ED Notes (Addendum)
Pt on phone talking w/[name]. Deputy called and advised will be here in approx 35 minutes to transport pt to [facility].

## 2020-01-13 NOTE — ED Notes (Signed)
Re-faxed IVC paperwork to Magistrate.

## 2020-01-13 NOTE — ED Notes (Signed)
Attempted to give update to family, but did not pick up

## 2020-01-14 DIAGNOSIS — E119 Type 2 diabetes mellitus without complications: Secondary | ICD-10-CM | POA: Diagnosis not present

## 2020-01-14 DIAGNOSIS — S0191XA Laceration without foreign body of unspecified part of head, initial encounter: Secondary | ICD-10-CM | POA: Diagnosis not present

## 2020-01-14 DIAGNOSIS — I10 Essential (primary) hypertension: Secondary | ICD-10-CM | POA: Diagnosis not present

## 2020-01-15 DIAGNOSIS — I10 Essential (primary) hypertension: Secondary | ICD-10-CM | POA: Diagnosis not present

## 2020-01-15 DIAGNOSIS — S0191XA Laceration without foreign body of unspecified part of head, initial encounter: Secondary | ICD-10-CM | POA: Diagnosis not present

## 2020-01-15 DIAGNOSIS — E119 Type 2 diabetes mellitus without complications: Secondary | ICD-10-CM | POA: Diagnosis not present

## 2020-01-16 ENCOUNTER — Other Ambulatory Visit: Payer: Self-pay | Admitting: *Deleted
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
## 2020-01-17 DIAGNOSIS — E119 Type 2 diabetes mellitus without complications: Secondary | ICD-10-CM | POA: Diagnosis not present

## 2020-01-17 DIAGNOSIS — F10939 Alcohol use, unspecified with withdrawal, unspecified: Secondary | ICD-10-CM | POA: Diagnosis not present

## 2020-01-19 DIAGNOSIS — F10139 Alcohol abuse with withdrawal, unspecified: Secondary | ICD-10-CM | POA: Diagnosis not present

## 2020-01-25 ENCOUNTER — Other Ambulatory Visit: Payer: Self-pay

## 2020-01-25 ENCOUNTER — Encounter (HOSPITAL_COMMUNITY): Payer: Self-pay

## 2020-01-25 ENCOUNTER — Emergency Department (HOSPITAL_COMMUNITY)
Admission: EM | Admit: 2020-01-25 | Discharge: 2020-01-26 | Disposition: A | Discharge status: Home / Self Care | Payer: Medicare Other | Attending: Emergency Medicine | Admitting: Emergency Medicine

## 2020-01-25 DIAGNOSIS — Z87891 Personal history of nicotine dependence: Secondary | ICD-10-CM | POA: Insufficient documentation

## 2020-01-25 DIAGNOSIS — E1165 Type 2 diabetes mellitus with hyperglycemia: Secondary | ICD-10-CM | POA: Diagnosis not present

## 2020-01-25 DIAGNOSIS — F10920 Alcohol use, unspecified with intoxication, uncomplicated: Secondary | ICD-10-CM

## 2020-01-25 DIAGNOSIS — F10929 Alcohol use, unspecified with intoxication, unspecified: Secondary | ICD-10-CM | POA: Insufficient documentation

## 2020-01-25 DIAGNOSIS — R519 Headache, unspecified: Secondary | ICD-10-CM

## 2020-01-25 DIAGNOSIS — E119 Type 2 diabetes mellitus without complications: Secondary | ICD-10-CM | POA: Insufficient documentation

## 2020-01-25 DIAGNOSIS — Z743 Need for continuous supervision: Secondary | ICD-10-CM | POA: Diagnosis not present

## 2020-01-25 DIAGNOSIS — Z79899 Other long term (current) drug therapy: Secondary | ICD-10-CM | POA: Diagnosis not present

## 2020-01-25 DIAGNOSIS — Z7984 Long term (current) use of oral hypoglycemic drugs: Secondary | ICD-10-CM | POA: Insufficient documentation

## 2020-01-25 DIAGNOSIS — G4489 Other headache syndrome: Secondary | ICD-10-CM | POA: Diagnosis not present

## 2020-01-25 MED ORDER — IBUPROFEN 400 MG PO TABS
600.0000 mg | ORAL_TABLET | Freq: Once | ORAL | Status: AC
  Administered 2020-01-25: 600 mg via ORAL
  Filled 2020-01-25: qty 2
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
## 2020-01-25 NOTE — ED Notes (Signed)
Pt ambulated to restroom to provide urine sample

## 2020-01-25 NOTE — ED Notes (Signed)
Pt calling out to nurses station now reporting severe posterior headache. Pt unsure if he fell and hit his head before being found lying in the road, or if it is related to drinking too much today.

## 2020-01-25 NOTE — ED Notes (Signed)
Pt reports drinking a couple cases of beer today. Reports last drink was apprx 1 hour before arrival to ED. Pt resting comfortably in bed at moment and provided cups of water to drink. Pt denies SI/HI.

## 2020-01-26 ENCOUNTER — Encounter: Payer: Self-pay | Admitting: *Deleted

## 2020-01-26 ENCOUNTER — Other Ambulatory Visit: Payer: Self-pay | Admitting: *Deleted
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
## 2020-01-30 ENCOUNTER — Other Ambulatory Visit: Payer: Self-pay | Admitting: *Deleted

## 2020-01-30 ENCOUNTER — Encounter: Payer: Self-pay | Admitting: *Deleted
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
## 2020-01-30 NOTE — Patient Outreach (Signed)
[facility]) Care Management  01/30/2020  Anthony [name] 1961/02/06 [id number]   CSW made an initial attempt to try and contact patient, as well as patient's nephew, Anthony [name], with whom patient currently resides, to perform the initial phone assessment today, as well as assess and assist with social work needs and services, without success.  A HIPAA compliant message was left for patient and Mr. Anthony [name] on voicemail, as they both appear to share the same contact number.  CSW is currently awaiting a return call.  CSW will make a second outreach attempt within the next 3-4 business days, on Monday, February 05, 2020, around 11:00am, if a return call is not received from patient and/or Mr. Anthony [name], in the meantime.  CSW will also mail an Outreach Letter to patient and Mr. Anthony [name] home requesting that they contact CSW if they are interested in receiving social work services through [place] with [facility].  Anthony [name], BSW, MSW, LCSW  Licensed Education officer, [facility]  Mailing [place] [address], [place], [place] [address] Physical Address-[address], [place], [place] [address] Toll Free Main # [phone] Fax # [phone] Cell # [phone]  Office # [phone] [email or web address]

## 2020-01-31 ENCOUNTER — Ambulatory Visit: Payer: Self-pay | Admitting: *Deleted

## 2020-02-01 ENCOUNTER — Encounter (HOSPITAL_COMMUNITY): Payer: Self-pay | Admitting: *Deleted

## 2020-02-01 ENCOUNTER — Other Ambulatory Visit: Payer: Self-pay | Admitting: *Deleted

## 2020-02-01 ENCOUNTER — Emergency Department (HOSPITAL_COMMUNITY): Payer: Medicare Other

## 2020-02-01 ENCOUNTER — Other Ambulatory Visit: Payer: Self-pay

## 2020-02-01 ENCOUNTER — Emergency Department (HOSPITAL_COMMUNITY)
Admission: EM | Admit: 2020-02-01 | Discharge: 2020-02-01 | Disposition: A | Discharge status: Home / Self Care | Payer: Medicare Other | Attending: Emergency Medicine | Admitting: Emergency Medicine

## 2020-02-01 DIAGNOSIS — S199XXA Unspecified injury of neck, initial encounter: Secondary | ICD-10-CM | POA: Diagnosis not present

## 2020-02-01 DIAGNOSIS — Y929 Unspecified place or not applicable: Secondary | ICD-10-CM | POA: Insufficient documentation

## 2020-02-01 DIAGNOSIS — R519 Headache, unspecified: Secondary | ICD-10-CM | POA: Diagnosis not present

## 2020-02-01 DIAGNOSIS — Z794 Long term (current) use of insulin: Secondary | ICD-10-CM | POA: Diagnosis not present

## 2020-02-01 DIAGNOSIS — D696 Thrombocytopenia, unspecified: Secondary | ICD-10-CM | POA: Diagnosis not present

## 2020-02-01 DIAGNOSIS — Z743 Need for continuous supervision: Secondary | ICD-10-CM | POA: Diagnosis not present

## 2020-02-01 DIAGNOSIS — W19XXXA Unspecified fall, initial encounter: Secondary | ICD-10-CM | POA: Diagnosis not present

## 2020-02-01 DIAGNOSIS — Z79899 Other long term (current) drug therapy: Secondary | ICD-10-CM | POA: Diagnosis not present

## 2020-02-01 DIAGNOSIS — I10 Essential (primary) hypertension: Secondary | ICD-10-CM | POA: Diagnosis not present

## 2020-02-01 DIAGNOSIS — D649 Anemia, unspecified: Secondary | ICD-10-CM | POA: Insufficient documentation

## 2020-02-01 DIAGNOSIS — Y9301 Activity, walking, marching and hiking: Secondary | ICD-10-CM | POA: Insufficient documentation

## 2020-02-01 DIAGNOSIS — F10920 Alcohol use, unspecified with intoxication, uncomplicated: Secondary | ICD-10-CM | POA: Diagnosis not present

## 2020-02-01 DIAGNOSIS — S0990XA Unspecified injury of head, initial encounter: Secondary | ICD-10-CM | POA: Diagnosis not present

## 2020-02-01 DIAGNOSIS — Y908 Blood alcohol level of 240 mg/100 ml or more: Secondary | ICD-10-CM | POA: Diagnosis not present

## 2020-02-01 DIAGNOSIS — Y998 Other external cause status: Secondary | ICD-10-CM | POA: Diagnosis not present

## 2020-02-01 DIAGNOSIS — E119 Type 2 diabetes mellitus without complications: Secondary | ICD-10-CM | POA: Diagnosis not present

## 2020-02-01 DIAGNOSIS — Z87891 Personal history of nicotine dependence: Secondary | ICD-10-CM | POA: Diagnosis not present

## 2020-02-01 LAB — CBC WITH DIFFERENTIAL/PLATELET
Abs Immature Granulocytes: 0.02 10*3/uL (ref 0.00–0.07)
Basophils Absolute: 0.1 10*3/uL (ref 0.0–0.1)
Basophils Relative: 2 %
Eosinophils Absolute: 0.3 10*3/uL (ref 0.0–0.5)
Eosinophils Relative: 4 %
HCT: 36.8 % — ABNORMAL LOW (ref 39.0–52.0)
Hemoglobin: 11.5 g/dL — ABNORMAL LOW (ref 13.0–17.0)
Immature Granulocytes: 0 %
Lymphocytes Relative: 33 %
Lymphs Abs: 2.3 10*3/uL (ref 0.7–4.0)
MCH: 26.3 pg (ref 26.0–34.0)
MCHC: 31.3 g/dL (ref 30.0–36.0)
MCV: 84.2 fL (ref 80.0–100.0)
Monocytes Absolute: 0.5 10*3/uL (ref 0.1–1.0)
Monocytes Relative: 6 %
Neutro Abs: 3.9 10*3/uL (ref 1.7–7.7)
Neutrophils Relative %: 55 %
Platelets: 202 10*3/uL (ref 150–400)
RBC: 4.37 MIL/uL (ref 4.22–5.81)
RDW: 15.7 % — ABNORMAL HIGH (ref 11.5–15.5)
WBC: 7.1 10*3/uL (ref 4.0–10.5)
nRBC: 0 % (ref 0.0–0.2)

## 2020-02-01 LAB — COMPREHENSIVE METABOLIC PANEL
ALT: 28 U/L (ref 0–44)
AST: 39 U/L (ref 15–41)
Albumin: 4 g/dL (ref 3.5–5.0)
Alkaline Phosphatase: 112 U/L (ref 38–126)
Anion gap: 11 (ref 5–15)
BUN: 10 mg/dL (ref 6–20)
CO2: 25 mmol/L (ref 22–32)
Calcium: 9.1 mg/dL (ref 8.9–10.3)
Chloride: 100 mmol/L (ref 98–111)
Creatinine, Ser: 0.58 mg/dL — ABNORMAL LOW (ref 0.61–1.24)
GFR calc Af Amer: >60 mL/min (ref >60)
GFR calc non Af Amer: >60 mL/min (ref >60)
Glucose, Bld: 273 mg/dL — ABNORMAL HIGH (ref 70–99)
Potassium: 3.7 mmol/L (ref 3.5–5.1)
Sodium: 136 mmol/L (ref 135–145)
Total Bilirubin: 0.5 mg/dL (ref 0.3–1.2)
Total Protein: 8 g/dL (ref 6.5–8.1)

## 2020-02-01 LAB — CBG MONITORING, ED: Glucose-Capillary: 262 mg/dL — ABNORMAL HIGH (ref 70–99)

## 2020-02-01 LAB — ETHANOL: Alcohol, Ethyl (B): 281 mg/dL — ABNORMAL HIGH (ref <10)

## 2020-02-01 MED ORDER — ACETAMINOPHEN 325 MG PO TABS
650.0000 mg | ORAL_TABLET | Freq: Once | ORAL | Status: AC
  Administered 2020-02-01: 650 mg via ORAL
  Filled 2020-02-01: qty 2

## 2020-02-01 NOTE — ED Notes (Signed)
Pt transported to CT Scan

## 2020-02-01 NOTE — Discharge Instructions (Addendum)
Apply ice to any area that is sore.  Take acetaminophen as needed for pain.  You need to stop drinking!

## 2020-02-01 NOTE — ED Notes (Signed)
RN attempted to contact Pts Legal Gaurdian (Mr. Anthony [name]- Pts Nephew). RN left HIPPA compliant message on voicemail. Pt resting comfortably in room at this time and is ready for discharge home.

## 2020-02-01 NOTE — ED Notes (Signed)
Pt returned from CT Scan 
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
## 2020-02-01 NOTE — Patient Outreach (Signed)
  [facility]) Care Management  02/01/2020  YIANNIS SERRAO 1961-01-20 [id number]   Care coordination- attempt to reach family after message left  [facility] RN CM received a voice message from Mr Gariepy's nephew, [name] requesting a return call   [facility] RN CM returned a call [place]. No answer [facility] RN CM left HIPAA [facility] Portability and Accountability Act) compliant voicemail message along with CM's contact information.   [facility] RN CM also encouraged a return call to [place] [facility] contact information   Plan: [facility] RN CM scheduled this patient for another call attempt within 7-14 days [facility] RN CM will continue to collaborate with [place]. Lavina Hamman, RN, BSN, [place] Coordinator Office number [phone] Mobile number [phone]  Main [facility] number [phone] Fax number [phone]

## 2020-02-01 NOTE — ED Triage Notes (Signed)
Pt brought in by [facility] for c/o fall while walking; pt was walking in the road and fell hitting his head; pt is intoxicated and has slurred speech; pt was seen here last week for same
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
## 2020-02-01 NOTE — ED Notes (Signed)
ED Provider at bedside. 

## 2020-02-04 ENCOUNTER — Emergency Department (HOSPITAL_COMMUNITY)
Admission: EM | Admit: 2020-02-04 | Discharge: 2020-02-05 | Disposition: A | Discharge status: Home / Self Care | Payer: Medicare Other | Attending: Emergency Medicine | Admitting: Emergency Medicine

## 2020-02-04 ENCOUNTER — Other Ambulatory Visit: Payer: Self-pay

## 2020-02-04 ENCOUNTER — Encounter (HOSPITAL_COMMUNITY): Payer: Self-pay | Admitting: *Deleted

## 2020-02-04 DIAGNOSIS — Z79899 Other long term (current) drug therapy: Secondary | ICD-10-CM | POA: Insufficient documentation

## 2020-02-04 DIAGNOSIS — R739 Hyperglycemia, unspecified: Secondary | ICD-10-CM

## 2020-02-04 DIAGNOSIS — E1165 Type 2 diabetes mellitus with hyperglycemia: Secondary | ICD-10-CM | POA: Diagnosis not present

## 2020-02-04 DIAGNOSIS — F10920 Alcohol use, unspecified with intoxication, uncomplicated: Secondary | ICD-10-CM

## 2020-02-04 DIAGNOSIS — I10 Essential (primary) hypertension: Secondary | ICD-10-CM | POA: Insufficient documentation

## 2020-02-04 DIAGNOSIS — Z794 Long term (current) use of insulin: Secondary | ICD-10-CM | POA: Diagnosis not present

## 2020-02-04 DIAGNOSIS — Z87891 Personal history of nicotine dependence: Secondary | ICD-10-CM | POA: Diagnosis not present

## 2020-02-04 LAB — CBC WITH DIFFERENTIAL/PLATELET
Abs Immature Granulocytes: 0.02 10*3/uL (ref 0.00–0.07)
Basophils Absolute: 0.1 10*3/uL (ref 0.0–0.1)
Basophils Relative: 1 %
Eosinophils Absolute: 0.1 10*3/uL (ref 0.0–0.5)
Eosinophils Relative: 2 %
HCT: 37.3 % — ABNORMAL LOW (ref 39.0–52.0)
Hemoglobin: 11.5 g/dL — ABNORMAL LOW (ref 13.0–17.0)
Immature Granulocytes: 0 %
Lymphocytes Relative: 24 %
Lymphs Abs: 1.6 10*3/uL (ref 0.7–4.0)
MCH: 26.1 pg (ref 26.0–34.0)
MCHC: 30.8 g/dL (ref 30.0–36.0)
MCV: 84.8 fL (ref 80.0–100.0)
Monocytes Absolute: 0.5 10*3/uL (ref 0.1–1.0)
Monocytes Relative: 8 %
Neutro Abs: 4.2 10*3/uL (ref 1.7–7.7)
Neutrophils Relative %: 65 %
Platelets: 162 10*3/uL (ref 150–400)
RBC: 4.4 MIL/uL (ref 4.22–5.81)
RDW: 15.5 % (ref 11.5–15.5)
WBC: 6.5 10*3/uL (ref 4.0–10.5)
nRBC: 0 % (ref 0.0–0.2)

## 2020-02-04 LAB — COMPREHENSIVE METABOLIC PANEL
ALT: 30 U/L (ref 0–44)
AST: 44 U/L — ABNORMAL HIGH (ref 15–41)
Albumin: 4 g/dL (ref 3.5–5.0)
Alkaline Phosphatase: 145 U/L — ABNORMAL HIGH (ref 38–126)
Anion gap: 15 (ref 5–15)
BUN: 10 mg/dL (ref 6–20)
CO2: 25 mmol/L (ref 22–32)
Calcium: 9.5 mg/dL (ref 8.9–10.3)
Chloride: 96 mmol/L — ABNORMAL LOW (ref 98–111)
Creatinine, Ser: 0.77 mg/dL (ref 0.61–1.24)
GFR calc Af Amer: >60 mL/min (ref >60)
GFR calc non Af Amer: >60 mL/min (ref >60)
Glucose, Bld: 551 mg/dL (ref 70–99)
Potassium: 3.9 mmol/L (ref 3.5–5.1)
Sodium: 136 mmol/L (ref 135–145)
Total Bilirubin: 0.8 mg/dL (ref 0.3–1.2)
Total Protein: 7.9 g/dL (ref 6.5–8.1)

## 2020-02-04 LAB — ETHANOL: Alcohol, Ethyl (B): 216 mg/dL — ABNORMAL HIGH (ref <10)

## 2020-02-04 LAB — CBG MONITORING, ED: Glucose-Capillary: 546 mg/dL (ref 70–99)

## 2020-02-04 MED ORDER — SODIUM CHLORIDE 0.9 % IV BOLUS
1000.0000 mL | Freq: Once | INTRAVENOUS | Status: AC
  Administered 2020-02-04: 1000 mL via INTRAVENOUS

## 2020-02-04 MED ORDER — INSULIN ASPART 100 UNIT/ML ~~LOC~~ SOLN
8.0000 [IU] | Freq: Once | SUBCUTANEOUS | Status: AC
  Administered 2020-02-04: 8 [IU] via SUBCUTANEOUS
  Filled 2020-02-04: qty 1
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
## 2020-02-04 NOTE — ED Notes (Signed)
Call from pt's brother, [name] [phone]). Pt lives with him he states pt just got out of rehab and started drinking again.

## 2020-02-05 ENCOUNTER — Encounter: Payer: Self-pay | Admitting: *Deleted

## 2020-02-05 ENCOUNTER — Other Ambulatory Visit: Payer: Self-pay | Admitting: *Deleted

## 2020-02-05 DIAGNOSIS — E1165 Type 2 diabetes mellitus with hyperglycemia: Secondary | ICD-10-CM | POA: Diagnosis not present

## 2020-02-05 LAB — CBG MONITORING, ED
Glucose-Capillary: 233 mg/dL — ABNORMAL HIGH (ref 70–99)
Glucose-Capillary: 355 mg/dL — ABNORMAL HIGH (ref 70–99)

## 2020-02-05 MED ORDER — INSULIN ASPART 100 UNIT/ML ~~LOC~~ SOLN
4.0000 [IU] | Freq: Once | SUBCUTANEOUS | Status: AC
  Administered 2020-02-05: 4 [IU] via SUBCUTANEOUS
  Filled 2020-02-05: qty 1

## 2020-02-05 MED ORDER — SODIUM CHLORIDE 0.9 % IV BOLUS
1000.0000 mL | Freq: Once | INTRAVENOUS | Status: AC
  Administered 2020-02-05: 1000 mL via INTRAVENOUS
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
## 2020-02-05 NOTE — Patient Outreach (Signed)
[facility]) Care Management  02/05/2020  NICKALAS HEMM May 20, 1961 [id number]  CSW received an incoming call from [name], patient's Telephonic RNCM, also with [facility] [facility], indicating that she had received several calls from patient's nephew, Ramon Dredge, requesting assistance with placement for patient.  Mrs. Lavina Hamman went on to say that Mr. [name] is having trouble accessing his voicemail and is not always available to answer the phone.  Mrs. Lavina Hamman requested that she be able to perform a 3-way call with herself, Mr. [name] and Mr. Chilton Greathouse fiance, Amber Little, to put CSW in direct contact with everyone.  Mr. [name] and Ms. Little admitted that they are no longer able to adequately care for patient in the home, requesting assistance with placement for patient into a group home, rest home, family care home or assisted living facility.    Mr. [name] and Ms. Little reported that patient continues to sneak out of their home, going around to various houses in the neighborhood asking for change, then goes to the store and buys 40 ounces of beer, until he becomes intoxicated, passing out in the road.  Mr. [name] and Ms. Little are extremely concerned about patient's safety, wanting to find more permanent and stable housing for patient.  Ms. [name] was under the impression that once patient is placed into a facility or group home setting that he would not be able to leave or wander the streets.  However, CSW explained to Ms. Little that patient does not meet criteria to be placed into an assisted living facility that offers memory care services, nor are group homes liable to detain their residents.  Mr. [name] admitted that he has completed paperwork, through [employer], to become patient's payee over his [place], but that he has not yet completed paperwork to become patient's [place].  CSW encouraged  Mr. [name] to assist patient with completing his Advanced Directives (Living Will and [place] documents), at his earliest convenience, agreeing to e-mail Ms. Little the documents today.  Mr. [name] and Ms. Little requested that [place] e-mail the documents to [email or web address].  In the meantime, Mr. [name] and Ms. Little reported that they will be making contact with patient's ex-mother-in-law to request that she relinquish her rights as patient's healthcare agent.  Mrs. Lavina Hamman was able to schedule patient an appointment with his Primary Care Physician, Dr. [name] at [address] [place], [place], for Thursday, February 08, 2020 at 2:00pm.  During that visit, Mrs. Lavina Hamman and CSW have requested that one of Dr. [name] nurses, Corky Sing, Maudie Mercury or [place], assist patient with notarizing his Advanced Directives documents, scanning a copy into patient's EMR (Electronic Medical Record).  In addition, CSW has requested that Dr. Legrand Rams review and sign the FL-2 Form that CSW has completed and faxed to [facility] today - phone # [phone]; fax # [phone].  Once the FL-2 Form has been reviewed and signed, [name] will then fax the form back to CSW so that CSW can begin pursing placement options for patient.   CSW explained to Mr. [name] and Ms. Little that [place] would need to obtain verbal consent from patient, in order for CSW to be able to fax patient's FL-2 Form to various long-term care facilities.  Ms. [name] was able to retrieve patient from his mother's home, putting him on the phone so that [place] and Mrs. Lavina Hamman could explain their intentions.  Patient voiced  understanding and was agreeable to this plan.  CSW also wanted to confirm that patient is agreeable to having Mr. [name] and Ms. Little schedule him a psychiatrist appointment with [facility] for medication management, as well as an appointment with a counselor, through [place] in Families, for  psychotherapeutic services.  CSW agreed to e-mail Mr. [name] and Ms. Little a list of resources in [facility], which included all of the following information:  [place] [address] [place], [place] # [place] of [place], [place] # [phone]  [place] [address] [place], [place] # [id number]  Robley Fries  [place].  [place], [place]  # [phone]  [place] [address] [place], [place] # [id number]  [facility] [place], [place] # [id number]  [place], [place]  # [phone]  [facility] [place], [place]  # [place]  [place], [place] # [id number]  [place] [id number] [place], [place]  # [id number]  [place], [place] # [place], [place] # [id number]  [facility] [place], [place] # [id number]  [facility] [address] [place], [place] # [id number]  Counseling Services  [facility] [address] [place], [place]  # [phone]   [facility]  [address] [place], [place]  # [phone]  CSW also attached the following information in the e-mail to Mr. [name] and Ms. Little, providing detailed instructions on the tasks that they will be responsible for completing:          Please contact Jovany's Case Worker at the [place] ([address], [place], [place] -        # [phone]) to inform them that we will be pursing long-term care        placement for [facility] into a rest  home or domiciliary level of care, and that        we need to switch his Adult Medicaid coverage [facility] -         [id number] P - Approved 03/23/2011) to [place]        Medicaid coverage.  Please complete Garment/textile technologist ([place] documents) with [place], prior to Thursday, February 08, 2020.  Take the completed documents with you to [facility] appointment with Dr. Legrand Rams ([address] [place], [place] - # [phone]) scheduled for Thursday, February 08, 2020 at 2:00pm.  Have Dr. [name] nurse make a copy of the documents to scan into [facility] electronic medical record.    Please obtain a copy of [facility]'s TB Skin Test results, performed in January of 2021, or a copy of [facility] Chest X-ray, performed in May of 2020, either would be sufficient for placement purposes.  Please have [place], Dr. [name] Nurse, fax [facility] completed and signed FL-2 Form to [place] at # [phone].  Please contact group homes, assisted living facilities, rest homes and/or family care homes, from list provided by CSW, to check bed availability, as well as to  confirm that they accept [place] Medicaid.  Once you have found several facilities of interest, that meet the above criteria, please contact [place] at # [phone].  CSW will then fax [place]'s information to all facilities of interest to try and pursue bed offers.   Please call [facility] ([address], [place], [place] - # [phone]) to schedule Nagi an appointment with a psychiatrist for medication management.  Please call [facility] ([address], [place], [place] - # [phone]) to get [place] established with a counselor for psychotherapeutic services.  CSW was able to make contact with Mr. [name] and Ms. Little to confirm that they received the e-mails that CSW sent to Ms. Little's e-mail address, including all of  the above information and resources, as well as an Financial controller packet.  Neither had questions for CSW at this time; therefore, CSW agreed to follow-up with Mr. [name] and Ms. Little as soon as CSW has received the completed and signed FL-2 Form from [place].     [name], BSW, MSW, LCSW  Licensed Education officer, environmental Health System  [place]ailing [place] [address] [place], [place] [address] Physical Address-[address], [place], [place] [address] Toll Free Main # [phone] Fax # [phone] Cell # [phone]  Office # [phone] [email or web address]

## 2020-02-05 NOTE — Discharge Instructions (Addendum)
Keep a close watch on your blood glucose levels and remember to take your medicines as prescribed by your MD.  Please refer to the resources below if you are interested in getting help with your alcohol use.

## 2020-02-05 NOTE — Patient Outreach (Signed)
Anthony [name]) Care Management  02/05/2020  Anthony [name] [birth year] [id number]   CSW was scheduled to make a second outreach attempt to patient today; however, CSW noted that patient presented to the Emergency Department at [name], via [facility] ([facility] on Sunday, February 04, 2020, around 9:15am, when bystanders witnessed patient lying in the road and called 911.  Patient reported to the bystanders, as well as to EMS workers, that he thought he was having a stroke; however, upon arrival to the Emergency Department, it was noted that patient was clearly intoxicated and experiencing Hyperglycemia.  Patient admitted to being noncompliant with his Diabetes and reported drinking several 40 ounce beers on the evening of Saturday, February 03, 2020.   Patient currently resides with his nephew, Anthony [name], who reported to staff at [facility] that patient was recently discharged from [facility] in [place].  While there, patient spent time on the Dual Diagnosis Unit, where he received individually tailored treatment in the stabilization of acute psychiatric/behavioral symptoms with co-occurring substance abuse issues, via individual and group therapy.  Patient has been diagnosed with ADD (Attention Deficit Disorder with Hyperactivity), Anxiety Disorder, Developmental Mental Disorder, Alcohol Use Disorder (Severe with Dependence), Alcohol Abuse, Alcohol Dependence with Withdrawal and Substance Abuse, among many other things.  CSW made an attempt to try and contact Mr. Anthony [name] today to perform the initial phone assessment on patient, as well as assess and assist with social work needs and services; however, Mr. Anthony [name] was unavailable at the time of CSW's call.  CSW left a HIPAA compliant message on voicemail for Mr. Anthony [name] and is currently awaiting a return call.  CSW will make a third and final outreach attempt within the next 3-4  business days, on Friday, February 09, 2020, around 9:00am, if a return call is not received from Mr. Anthony [name] in the meantime.  CSW would like to suggest to Mr. Anthony [name] that patient be placed in a dual diagnosis group home for long-term care services.  Anthony [name], BSW, MSW, LCSW  Licensed Education officer, environmental Health System  [place]ailing [place] [address] [place], [place] [address] Physical Address-[address], [place], [place] [address] Toll Free Main # [phone] Fax # [phone] Cell # [phone]  Office # [phone] [name]
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
## 2020-02-09 ENCOUNTER — Emergency Department (HOSPITAL_COMMUNITY)
Admission: EM | Admit: 2020-02-09 | Discharge: 2020-02-09 | Disposition: A | Discharge status: Home / Self Care | Payer: Medicare Other | Attending: Emergency Medicine | Admitting: Emergency Medicine

## 2020-02-09 ENCOUNTER — Other Ambulatory Visit: Payer: Self-pay

## 2020-02-09 ENCOUNTER — Ambulatory Visit: Payer: Self-pay | Admitting: *Deleted

## 2020-02-09 ENCOUNTER — Encounter (HOSPITAL_COMMUNITY): Payer: Self-pay | Admitting: Emergency Medicine

## 2020-02-09 ENCOUNTER — Other Ambulatory Visit: Payer: Self-pay | Admitting: *Deleted

## 2020-02-09 DIAGNOSIS — M549 Dorsalgia, unspecified: Secondary | ICD-10-CM | POA: Insufficient documentation

## 2020-02-09 DIAGNOSIS — R519 Headache, unspecified: Secondary | ICD-10-CM | POA: Diagnosis not present

## 2020-02-09 DIAGNOSIS — Z5321 Procedure and treatment not carried out due to patient leaving prior to being seen by health care provider: Secondary | ICD-10-CM | POA: Insufficient documentation

## 2020-02-09 NOTE — ED Triage Notes (Signed)
Patient is complaining of head and back pain. Patient is intoxicated.

## 2020-02-09 NOTE — Patient Outreach (Signed)
[facility]) Care Management  02/09/2020  DEMONTRE KIMBER [birth year] [id number]   CSW was able to make to contact with Amber Little this morning, fiance of Ramon Dredge, patient's nephew, after receiving an e-mail message from Ms. Little last evening.  Patient currently resides in the home with Mr. [name] and Ms. Little, as they are trying to provide patient with 24 hour care and supervision, as well as keep patient from wandering out of the house to buy alcohol and become intoxicated.  Ms. [name] explained that they had to reschedule patient's appointment with Dr. [name], Primary Care Physician, scheduled for yesterday (Thursday, February 08, 2020 at 2:00pm), due to hazardous weather conditions.  Patient's appointment with Dr. Legrand Rams has been rescheduled for Monday, February 12, 2020 at 2:00pm.  Ms. [name] further explained that she received the e-mail attachment, containing the [place] (Living Will and [place] documents), sent to her by CSW and Lavell Islam, Engineering geologist, also with [facility], but that she is unable to complete the documents on-line or print them out.  Ms. [name] went on to explain that they do not have access to a printer, nor will the attachment allow her to edit the documents.  With the assistance of Mrs. Rosana Hoes, CSW was able to e-mail the documents to [place], Dr. [name] Nurse, who agreed to print the documents and have them ready when Ms. Little and Mr. [name] bring patient in for his appointment on Monday.  CSW encouraged patient, Ms. Little and Mr. [name] to arrive 30 minutes early to patient's appointment on Monday, which should allow them plenty of time to retrieve the documents from the receptionist at Dr. [name] office, and complete them before patient's scheduled appointment.  After patient's appointment with Dr. Legrand Rams, [name] agreed to pull together two witnesses from the  office, have them witness patient's signature, then notarize the documents.  CSW will ensure that [name] knows to keep a copy of the completed and signed Advanced Directives to scan into patient's EMR (Electronic Medical Record).  E[place] confirmed receiving the completed FL-2 Form for patient, faxed to her by CSW on Monday, February 05, 2020.  Dr. Legrand Rams will be able to review the FL-2 Form for accuracy, as well as sign off on the document, once he has completed his appointment with patient.  [name] has then been instructed to fax the completed and signed FL-2 Form back to CSW.  In the meantime, patient, Ms. Little and Mr. [name] were encouraged to review the list of group homes, assisted living facilities, rest homes and family care homes that [place] e-mailed to Ms. Little on Monday, February 05, 2020, to check bed availability, as well as to ensure that they accept [place] Medicaid.  CSW then requested that Ms. Little and/or Mr. [name] contact CSW directly to provide CSW with facilities of interest so that CSW can begin pursing bed offers for patient.  CSW urged Ms. Little and Mr. [name] to go ahead and contact patient's Medicaid case worker, at the [facility], to begin the process of switching patient's Adult Medicaid coverage to [place] Medicaid coverage, as patient will definitely need this payor source at a long-term care facility.  Ms. [name] is aware that CSW will need a copy of either patient's TB Skin Test results, performed in January of 2021, or a copy of patient's Chest X-ray, performed in May of 2020, a requirement  for all long-term care facilities.  Patient will also need to have a COVID-19 Screening Test performed, at least 72 hours prior to admission into a long-term care facility, with negative results.     Once the completed and signed FL-2 Form has been obtained, as well as copies of patient's TB Skin Test  results and/or the results of patient's Chest X-ray, to confirm that patient does not have Tuberculosis, CSW will fax these documents, along with patient's demographics sheet, medication list, history & physical and Medicaid information, to all facilities of interest.  Ms. [name] indicated that she is still working on arranging psychotherapeutic services for patient, through [facility] for medication management with a psychiatrist, in addition to arranging counseling services, through a counselor/therapist with [facility].  CSW politely reminded Ms. Little to confirm that patient is taking all of his medications exactly as prescribed.  Ms. [name] agreed to follow-up with CSW after patient's appointment with Dr. Legrand Rams on Monday, to report findings.  Ms. [name] admitted that patient is now begging to be able to continue to live with her and Mr. [name], promising to behave and never run away again.  According to Ms. Little, patient has also promised to never drink alcohol again.  Patient was initially agreeable to long-term care placement, but has apparently changed his mind, wanting to continue to reside in the home with Ms. [name] and Mr. [name].  Ms. [name] admitted that she and Mr. [name] are giving it careful consideration, as patient has been "extremely cooperative this week, has not left the house once and has not attempted to buy alcohol".    [name], BSW, MSW, LCSW  Licensed Education officer, environmental Health System  Mailing [place] [address] [place], [place] [address] Physical Address-[address], [place], [place] [address] Toll Free Main # [phone] Fax # [phone] Cell # [phone]  Office # [phone] [email or web address]

## 2020-02-12 ENCOUNTER — Other Ambulatory Visit: Payer: Self-pay | Admitting: *Deleted

## 2020-02-12 ENCOUNTER — Other Ambulatory Visit: Payer: Self-pay

## 2020-02-12 DIAGNOSIS — I10 Essential (primary) hypertension: Secondary | ICD-10-CM | POA: Diagnosis not present

## 2020-02-12 DIAGNOSIS — E119 Type 2 diabetes mellitus without complications: Secondary | ICD-10-CM | POA: Diagnosis not present

## 2020-02-12 NOTE — Patient Outreach (Signed)
  [facility]) Care Management  02/12/2020  Anthony [name] [birth year] [id number]   Unsuccessful [facility] outreach for this [facility] UM referred patient  Outreach attempt to follow up with patient With review of THN SW note, [facility] RN aware of rescheduled new patient MD appointment with Dr Legrand Rams to 02/12/20 at 2 pm. [facility] SW continued interventions with [name] and his family on resources and community services needed.  THN RN CM noted [name] hospital ED visit for headache and intoxication on 02/09/20   No answer. THN RN CM left HIPAA [facility] Portability and Accountability Act) compliant voicemail message along with CM's contact info.   Plan: [facility] RN CM scheduled this patient for another call attempt within 7-10 business days  [place] Maybee L. Lavina Hamman, RN, BSN, [place] Coordinator Office number [phone] Mobile number [phone]  Main THN number [phone] Fax number [phone]

## 2020-02-14 ENCOUNTER — Other Ambulatory Visit: Payer: Self-pay

## 2020-02-14 NOTE — Patient Outreach (Signed)
[facility]) Care Management  02/14/2020  DAGEM LAZER [birth year] [id number]   Medication Adherence call to Mr. Davina Poke Telephone call to Patient regarding Medication Adherence unable to reach [place] a message with patient son for patient to call back. Mr. Lenning is showing past due on Atorvastatin 40 mg under [place].   [facility] Direct Dial [phone]  Fax [phone] [email or web address]

## 2020-02-16 ENCOUNTER — Other Ambulatory Visit: Payer: Self-pay | Admitting: *Deleted

## 2020-02-16 ENCOUNTER — Encounter: Payer: Self-pay | Admitting: *Deleted

## 2020-02-16 NOTE — Patient Outreach (Signed)
[facility]) Care Management  02/16/2020  Anthony [name] 1961/08/08 [id number]  CSW was able to make contact with patient's nephew, Anthony [name], as well as [name], [employer], individuals with whom patient currently resides, to confirm that they are meeting with the owner at [facility] in [place], [place], today (Friday, February 16, 2020), for possible long-term care placement arrangements for patient.  Mr. Anthony [name] and Ms. Little voiced a great deal of concern about patient being able to remain at [facility], as patient continues to drink alcohol and wander the streets.  Patient refuses any type of alcohol treatment program, whether it be inpatient or outpatient, nor is patient willing to attend AA (Alcoholics Anonymous) Meetings, even after Mr. Anthony [name] and Ms. Little have agreed to attend the meetings with him.  According to Mr. Anthony [name] and Ms. Little, patient continues to promise to stop drinking, but then goes out and gets intoxicated.  Fortunately, Mr. Anthony [name] and Ms. Little were able to complete patient's Advanced Directives ([place] documents) with him, this past Monday, February 12, 2020, during his scheduled 2:00pm appointment with his Primary Care Physician, Dr. [name].  The documents were completed, signed, witnessed and notarized, and a copy was scanned into patient's EMR (Electronic Medical Record) in Epic.  Mr. Anthony [name] is now patient's [place] and able to make appropriate decisions on patient's behalf.  Mr. Anthony [name] and Ms. Little were also able to obtain patient's FL-2 Form, completed and faxed to Dr. [name] Nurse, [name], for Dr. [name] review and signature.  Ms. [name] reported that she has already hand delivered patient's FL-2 Form to all facilities of interest, to try and pursue other bed offers, in the event that [facility] does not work out.   Mr.  Anthony [name] and Ms. Little indicated that they have been in contact with patient's Medicaid Case Worker, at the [facility], and that patient's Adult Medicaid coverage is in the process of being switched to [place] Medicaid coverage.  Mr. Anthony [name] and Ms. Little were encouraged to contact patient's Medicaid Case Worker as soon as they have decided on a long-term care facility of choice, so that patient's Medicaid Case Worker can contact the facility to make ongoing payment arrangements.  Mr. Anthony [name] and Ms. Little were able to confirm that they still have the complete list of Group Homes, [facility] in [place], [place], that [facility] e-mailed to Ms. Little on Monday, February 05, 2020, per her request.  They also have a copy of patient's negative TB Skin Test results, as well as a copy of patient's negative Chest X-ray results.  Mr. Anthony [name] and Ms. Little reported that they plan to take patient today to have a full panel of labs drawn, apparently another requirement for placement at [facility].  Last, CSW inquired as to whether or not Mr. Anthony [name] or Ms. Little have been able to schedule patient an appointment with [facility] for medication management, as well as [facility] for counseling services.  Mr. Anthony [name] and Ms. Little admitted that they know they need to schedule these appointments for patient as soon as possible, but wanting to wait until patient has officially been placed at [facility], 99.9 % sure that patient will be placed there, to coordinate transportation with [name].  Mr. Anthony [name] and Ms. Little confirmed that they still have all the contact information for [facility], e-mailed to them by CSW.  CSW will perform a case closure on patient, as all goals of treatment have been met from social work  standpoint and no additional social work needs have been identified at this time.  CSW will notify patient's RNCM with [facility], [name] of CSW's plans to close patient's case.  CSW will fax an update to patient's Primary Care Physician, Dr. [name] to ensure that he is aware of CSW's involvement with patient's plan of care.  CSW was able to confirm that Mr. Anthony [name] and Ms. Little have the correct contact information for CSW, encouraging them to contact CSW directly if additional social work needs arise in the near future, or if they decide not to place patient at [facility], so that [facility] can assist them with pursuing other placement options.  Both voiced understanding and were agreeable to this plan, most appreciative of all assistance provided to them by [facility], thus far.     [name], BSW, MSW, LCSW  Licensed Education officer, environmental Health System  Mailing [place] [address] [place], [place] [address] Physical Address-[address], [place],  [address] Toll Free Main # [phone] Fax # [phone] Cell # [phone]  Office # [phone] [email or web address]

## 2020-02-19 ENCOUNTER — Other Ambulatory Visit: Payer: Self-pay | Admitting: *Deleted

## 2020-02-19 ENCOUNTER — Encounter: Payer: Self-pay | Admitting: *Deleted

## 2020-02-19 DIAGNOSIS — E785 Hyperlipidemia, unspecified: Secondary | ICD-10-CM | POA: Diagnosis not present

## 2020-02-19 DIAGNOSIS — E1165 Type 2 diabetes mellitus with hyperglycemia: Secondary | ICD-10-CM | POA: Diagnosis not present

## 2020-02-19 DIAGNOSIS — Z0001 Encounter for general adult medical examination with abnormal findings: Secondary | ICD-10-CM | POA: Diagnosis not present

## 2020-02-19 NOTE — Patient Outreach (Signed)
[place]) Care Management  02/19/2020  Anthony [name] 04/01/1961 [id number]  [facility] outreach from Mr Nicholls's family related to facility placement [facility] RN CM received a call from [facility] [place] after [facility] RN CM left a message  He is able to verify HIPAA, DOB and Address  Consent: [facility] RN CM reviewed [facility] services with patient. Patient gave verbal consent for [facility] telephonic RN CM services.  [name] shared with [facility] RN CM that Mr Douthit was brought to [name]'s home by 2 [place] policemen " about five in the evening" and reported that to have been "found lying in the road again" Mr Pettinger was not injured and went in the home without concern. He was not taken to the ED. [name] reports Mr Koziol is receiving care, medicine and food at [name]'s home but is not being offered money to Insurance underwriter. [name] reports that Mr Crochet "snuck out" and removed a television purchased for him by [name] from [name]'s home on Sunday 02/18/20 and "sold it"  [name] reports this television was to be taken with Mr Marez to the group home.  Mr Nijjar is noted without ED visits in the last 13 days  Mr Leask was encouraged by [facility] RN CM and [facility] SW to remain at home for safety ant to take his medications as ordered on 02/05/20 and 02/12/20  [facility] RN CM provided support and answered questions when [name] shared that he and Mr Timson visited the [facility] (Group home-- [address] [place], [place] [address] [phone]), Mr Treu met his new room mates and the plan per [name] is for placement to occur around Friday 02/23/20. THN RN CM discussed that most group homes provide 24 hour supervision and activities for the residents. [name] shares he is concerned about Mr Hosick "sneaking out" [facility] RN CM inquired if the interventions to get Mr Moffatt connected with Chinita Pester were completed. [name] reports the interventions have not been completed. [name]  states he was informed that initially there would be minimal visiting related to covid precautions.  He reports he and his girlfriend Museum/gallery conservator have been attempting to complete all the interventions discussed by [facility] SW He confirms Mr Kolbeck is to return to Dr [name] office on Wednesday 02/21/20 1430 to read his Tuberculosis (TB) skin test, complete the FL2 and covid test at [facility]. THN RN CM inquired about the completion of the Power of Attorney [facility]) and encouraged this completion during the pcp visit. [name] reports the forms have to be witnessed and notarized. [name] was encouraged to continue and complete the interventions recommended by the [facility] SW  [name] did discuss not having access to an automobile at intervals but confirms he will have access on 02/21/20   Plan [facility] RN CM will follow up with Mr Montalto and family within the next 7 business days  Routed note to MD/NP PA  [facility] CM Care Plan Problem One     Most Recent Value  Care Plan Problem One  alcohol abuse disorder with frequent ED visits  Role Documenting the Problem One  Care Management Telephonic Coordinator  Care Plan for Problem One  Active  [facility] Long Term Goal   over the next 31 days patient will be able to verbalize medical plan of care for chronic medical diagnoses  THN Long Term Goal Start Date  01/26/20  Interventions for  Problem One Long Term Goal  Assessed for home care answered questions and provided support  THN CM Short Term Goal #1   over the next 14 days patient and or payee will be able to verbalize hoem concerns that need to be addressed during follolw up calls  [facility] CM Short Term Goal #1 Start Date  12/06/19  [facility] CM Short Term Goal #1 Met Date  01/10/20  THN CM Short Term Goal #2   over the next 14 days patient will be assisted to locate and schedule and appointment with a new local [place] primary care MD to establish care with as evidence of notes or verbalization of completed appointment  [facility] CM Short Term  Goal #2 Start Date  01/26/20  [facility] CM Short Term Goal #2 Met Date  02/20/20  THN CM Short Term Goal #3  over the next 21 days the patient will receive contact and resources from [facility] SW as evidence by pt verbalization or EMR notes   THN CM Short Term Goal #3 Start Date  01/26/20  [facility] CM Short Term Goal #3 Met Date  02/20/20  THN CM Short Term Goal #4  over the next 30 days patient and family will complete interventions to assist with group home placement as evidence by verbalization that the patient is residing in a group home during follow up outreach  [facility] CM Short Term Goal #4 Start Date  02/20/20  Interventions for Short Term Goal #4  answered questions about group homes and provided support and encouragement        Kennetta Pavlovic L. Lavina Hamman, RN, BSN, [place] Coordinator Office number [phone] Mobile number [phone]  Main THN number [phone] Fax number [phone]

## 2020-02-21 ENCOUNTER — Other Ambulatory Visit: Payer: Self-pay | Admitting: *Deleted

## 2020-02-21 ENCOUNTER — Other Ambulatory Visit: Payer: Self-pay

## 2020-02-21 ENCOUNTER — Ambulatory Visit: Payer: Medicare Other | Attending: Internal Medicine

## 2020-02-21 DIAGNOSIS — Z20822 Contact with and (suspected) exposure to covid-19: Secondary | ICD-10-CM

## 2020-02-21 NOTE — Patient Outreach (Signed)
    [facility]) Care Management  02/21/2020  MACKINNON GENOVA 01/04/1961 [id number]   [facility] care coordination- Group home requirement list/inteventions  THn RN CM called as requested to speak with Mr Moriarty after his follow up to primary care provider (PCP)  Mr Andreas Ohm answered and stated Mr Kimbro was not with him at the time of the call [name] is able to verify HIPAA ([place]) identifiers, date of birth (DOB) and address   [name] reports  Mr Sperle was take to the health department to get his Tuberculosis (TB) skin test read. The site was reported to be red and therefore he will need a chest x ray per protocal. He is aware that he will need to go [facility] the [facility] but will [facility] to [facility].    At the primary care provider (PCP) They were informed the Power of Attorney [facility]) form could not be notarized as Dr Legrand Rams was not present.Thad Ranger returns on 02/26/20 Monday. [name] took it to his local back but was informed he could not be assisted to notarize the POA form He inquired about other places that may be able to notorize the POA form. THN RN CM consulted [place] social work (SW) staff. Pending responses  The covid test was completed and should return in 3-5 days per [place].   The FL 2 was completed.   [name] has updated Levie Heritage at the group home   [facility] RN CM will return a call to Mr Riehm and his nephew, [name] when [facility] RN CM receives responses for other places to get the POA notarized  Brande Uncapher L. Lavina Hamman, RN, BSN, [place] Coordinator Office number [phone] Mobile number [phone]  Main THN number [phone] Fax number [phone]

## 2020-02-22 ENCOUNTER — Other Ambulatory Visit: Payer: Self-pay | Admitting: *Deleted

## 2020-02-22 ENCOUNTER — Other Ambulatory Visit (HOSPITAL_COMMUNITY): Payer: Self-pay | Admitting: Pulmonary Disease

## 2020-02-22 DIAGNOSIS — A159 Respiratory tuberculosis unspecified: Secondary | ICD-10-CM

## 2020-02-22 LAB — NOVEL CORONAVIRUS, NAA: SARS-CoV-2, NAA: NOT DETECTED

## 2020-02-22 NOTE — Patient Outreach (Signed)
[place] [facility]) Care Management  02/22/2020  Anthony [name] [birth year] [id number]    THN follow up with complex care patient    Insurance:united healthcare medicareand medicaid of [place]  Anthony [name] is able to verify HIPAA ([place]) identifiers, date of birth (DOB) and address Reviewed the purpose of the call is to follow up with him on the progress of the interventions needed to be completed for group home placement  Anthony [name] reports he is looking forward to going to reside in the [place] family care group home. He reports he has had a good day and denies pain or medical concerns today.  THN RN CM reviewed the contact with Anthony [name] this week and answering questions to help with the completion of the forms, tests and appointments to get him prepared to go to [facility] family [facility] RN CM assessed his feelings about [facility] group home.Anthony [name] reports he met his new room mates and is "ready" [facility] RN CM reviewed the need to get a chest xray after his TB test area was red, to get his POA form notarized and to get his covid test back. Anthony [name] was encouraged to follow the covid safety precautions and he informs CM he "carry lots of masks in my pockets" THN RN CM provided support He informed [facility] RN CM what occurred today at [facility] when he went in by himself to get a chest xray unsuccessfully. He reports waiting and then being informed the chest x ray could not be completed.  Anthony [name] informs [facility] RN CM he did not go in the radiology office with Anthony [name] but sent Anthony [name] in with his request form from the health department for an x ray.  Anthony [name] reports he will get Anthony [name] to [place] as instructed initially by the health department to get the needed chest x ray.   [facility] RN CM discussed with Anthony [name] that he will required to not drink alcohol and exhibit behaviors that would harm him or others at the group home. He voiced  understanding THN RN CM there will be rules he will have to follow and he stated he understood.   [facility] RN CM inquired about him being brought home by the local police. He reports the police men were "nice cause they could have locked me up."  He states he did not know the police men but that he had "seen them around"  [facility] RN CM inquired why he was found lying in the road. He reports "I don't know" and "Some time my blood sugar gets low" [facility] RN CM discussed with him that it was not safe to be in the road. He agreed and states "I could get hit by a car." [facility] RN CM agreed and discussed it was important for him not to get hurt or to unintentionally hurt someone else. [facility] RN CM encouraged him not to lie down or to be in the road. He voiced understanding.    Anthony [name] was updated that [place] recommend he try to go to the local [employer], local post office or the [facility] chaplin services for notarization of the POA form. Anthony [name] also is aware that Dr Legrand Rams returns on Monday 02/26/20  Anthony [name] reports he has updated [employer] on the pending items needed to be completed and he states she informed him she would keep a bed for Anthony [name]   [facility] RN CM provided encouragement to  Anthony [name] and Anthony [name]    [facility] RN CM will follow up with Anthony [name] and Anthony [name] in 7-14 business days if not contact prior to the schedule time   [place] L. Lavina Hamman, RN, BSN, [place] Coordinator Office number [phone] Mobile number [phone]  Main THN number [phone] Fax number [phone]

## 2020-02-23 ENCOUNTER — Other Ambulatory Visit: Payer: Self-pay | Admitting: *Deleted
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
## 2020-02-28 ENCOUNTER — Other Ambulatory Visit: Payer: Self-pay | Admitting: *Deleted

## 2020-02-28 NOTE — Patient Outreach (Signed)
[facility]) Care Management  02/28/2020  Anthony [name] 1961/10/31 [id number]  THN unsuccessful outreach    Outreach attempt unsuccessful No answer. THN RN CM was not able to leave a voice message at the mail box is full  Plan: [facility] RN CM scheduled this patient for another call attempt within 4 business days  Aslee Such L. Lavina Hamman, RN, BSN, [place] Coordinator Office number [phone] Mobile number [phone]  Main THN number [phone] Fax number [phone]

## 2020-02-29 ENCOUNTER — Other Ambulatory Visit: Payer: Self-pay | Admitting: *Deleted
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
## 2020-03-01 ENCOUNTER — Ambulatory Visit: Payer: Self-pay | Admitting: *Deleted

## 2020-03-07 ENCOUNTER — Other Ambulatory Visit: Payer: Self-pay

## 2020-03-07 ENCOUNTER — Encounter: Payer: Self-pay | Admitting: *Deleted

## 2020-03-07 ENCOUNTER — Other Ambulatory Visit: Payer: Self-pay | Admitting: *Deleted
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
## 2020-04-17 ENCOUNTER — Other Ambulatory Visit: Payer: Self-pay | Admitting: *Deleted

## 2020-04-17 NOTE — Patient Outreach (Signed)
Member screened for potential [facility] needs as a benefit of [place].  Per Patient Anthony [name] member has had multiple ED visits/hospitalizations. However, last admission was in February 2021.   Chart reviewed. Member previously active with [facility]. Please see extensive note by [facility] RNCM on 03/07/20 for details.   Initially contacted [facility] home[facility]group home ([place], [place], [place] [address] [phone]. Person that answered the phone stated [name] was not in.   Telephone call made to [facility] [phone] (nephew/POA). Patient identifiers confirmed. [name] endorses that member remains a resident at [facility]. States member has not had an ED visit or hospitalization since February 2021. States the family care group home supplies all of [name]'s needs regarding medication management, doctors appointments and the like. States member "listens to them and is doing very well."  Denies any further [facility] needs. Expressed appreciation of the call.    Marthenia Rolling, MSN-Ed, RN,BSN [place] Acute Care Coordinator [phone] [facility]) [phone]  (Toll free office)

## 2020-04-25 DIAGNOSIS — E119 Type 2 diabetes mellitus without complications: Secondary | ICD-10-CM | POA: Diagnosis not present

## 2020-04-25 DIAGNOSIS — B369 Superficial mycosis, unspecified: Secondary | ICD-10-CM | POA: Diagnosis not present

## 2020-04-25 DIAGNOSIS — E1140 Type 2 diabetes mellitus with diabetic neuropathy, unspecified: Secondary | ICD-10-CM | POA: Diagnosis not present

## 2020-04-29 ENCOUNTER — Other Ambulatory Visit: Payer: Self-pay | Admitting: *Deleted
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
## 2020-05-15 ENCOUNTER — Ambulatory Visit: Payer: Medicare Other | Admitting: Family Medicine

## 2020-05-21 DEATH — deceased
